# Patient Record
Sex: Female | Born: 1970 | Race: Black or African American | Hispanic: No | Marital: Single
Health system: Midwestern US, Community
[De-identification: ages and names within clinical notes are randomized; demographics above are authoritative.]

## PROBLEM LIST (undated history)

## (undated) ENCOUNTER — Emergency Department (HOSPITAL_COMMUNITY): Admission: EM | Payer: Self-pay | Source: Home / Self Care

## (undated) DIAGNOSIS — G9050 Complex regional pain syndrome I, unspecified: Secondary | ICD-10-CM

## (undated) DIAGNOSIS — K219 Gastro-esophageal reflux disease without esophagitis: Secondary | ICD-10-CM

## (undated) DIAGNOSIS — F419 Anxiety disorder, unspecified: Secondary | ICD-10-CM

## (undated) DIAGNOSIS — I219 Acute myocardial infarction, unspecified: Secondary | ICD-10-CM

## (undated) DIAGNOSIS — I10 Essential (primary) hypertension: Secondary | ICD-10-CM

## (undated) DIAGNOSIS — F410 Panic disorder [episodic paroxysmal anxiety] without agoraphobia: Secondary | ICD-10-CM

## (undated) DIAGNOSIS — M5412 Radiculopathy, cervical region: Secondary | ICD-10-CM

## (undated) HISTORY — PX: LEFT HEART CATH: CATH118248

## (undated) HISTORY — DX: Radiculopathy, cervical region: M54.12

## (undated) HISTORY — PX: TUBAL LIGATION: SHX77

## (undated) HISTORY — DX: Anxiety disorder, unspecified: F41.9

## (undated) HISTORY — PX: COLONOSCOPY: SHX174

## (undated) HISTORY — DX: Gastro-esophageal reflux disease without esophagitis: K21.9

## (undated) HISTORY — DX: Panic disorder (episodic paroxysmal anxiety): F41.0

## (undated) HISTORY — DX: Essential (primary) hypertension: I10

## (undated) HISTORY — PX: UPPER GASTROINTESTINAL ENDOSCOPY: SHX188

---

## 2016-08-14 ENCOUNTER — Inpatient Hospital Stay: Admit: 2016-08-14 | Discharge: 2016-08-14 | Attending: Student in an Organized Health Care Education/Training Program

## 2016-08-14 NOTE — ED Notes (Signed)
Attempted to call patient three times over the last 60 minutes. No response. Informed CN. Will discharge patient.

## 2016-08-14 NOTE — ED Triage Notes (Addendum)
Pt states she is new to [place] area.  Went to [facility] to have medications continued and they could help her but so much, went to scheduled appt but it was only to complete paperwork.  States she is now out of her medications and needs help.  Needs refill for Clonazepam only, other Rx she has filled.  Pt complains of headache and presents anxious.

## 2016-08-14 NOTE — ED Notes (Cosign Needed)
7:28 PM  I have evaluated the patient as the Provider in Triage. I have reviewed Her vital signs and the triage nurse assessment. I have talked with the patient and any available family and advised that I am the provider in triage and have ordered the appropriate study to initiate their work up based on the clinical presentation during my assessment.  I have advised that the patient will be accommodated in the Main ED as soon as possible.  I have also requested to contact the triage nurse or myself immediately if the patient experiences any changes in their condition during this brief waiting period.  Marjie Skiff, PA-C

## 2016-10-13 DIAGNOSIS — D259 Leiomyoma of uterus, unspecified: Secondary | ICD-10-CM

## 2016-10-13 NOTE — ED Notes (Signed)
Bedside and Verbal shift change report received from Jillian, RN (offgoing nurse). Report included the following information SBAR, ED Summary, MAR and Recent Results.

## 2016-10-13 NOTE — ED Notes (Signed)
Jessica Burch at bedside.

## 2016-10-13 NOTE — ED Notes (Signed)
Report given to Ingrid RN.

## 2016-10-13 NOTE — ED Provider Notes (Signed)
HPI Comments: 46 yo F with hx of MI, anxiety, HTN, and hyper cholesteremia here for evaluation of generalized fatigue.  States she has just fell "ill" since yesterday.  Admits to starting cycle yesterday; had has some lower abd pain; hx of fibroids in the past.    Also had some nausea and chest pressure that is only when lying flat; states resolved when position changes.    +Urinary frequency.    Denies fever, cough, CP.   Cards In [place].   Former smoker.      Patient is a 46 y.o. female presenting with general illness. The history is provided by the patient.   Generalized Body Aches   This is a new problem. The current episode started yesterday. The problem occurs constantly. Associated symptoms include abdominal pain. Pertinent negatives include no headaches. She has tried nothing for the symptoms.        Past Medical History:   Diagnosis Date   ??? Elevated cholesterol    ??? Hypertension    ??? MI (myocardial infarction) ([place])    ??? Psychiatric disorder     anxiety, panic attacks       Past Surgical History:   Procedure Laterality Date   ??? CARDIAC SURG PROCEDURE UNLIST      w/o stents   ??? HX TUBAL LIGATION           History reviewed. No pertinent family history.    Social History     Social History   ??? Marital status: N/A     Spouse name: N/A   ??? Number of children: N/A   ??? Years of education: N/A     Occupational History   ??? Not on file.     Social History Main Topics   ??? Smoking status: Former Smoker   ??? Smokeless tobacco: Never Used   ??? Alcohol use No   ??? Drug use: No   ??? Sexual activity: Not on file     Other Topics Concern   ??? Not on file     Social History Narrative         ALLERGIES: Darvocet a500 [propoxyphene n-acetaminophen]; Norvasc [amlodipine]; and Vicodin [hydrocodone-acetaminophen]    Review of Systems   Constitutional: Negative.    HENT: Negative for ear discharge.    Eyes: Negative for photophobia, pain, discharge and visual disturbance.   Respiratory: Negative for apnea and cough.     Cardiovascular: Negative for palpitations and leg swelling.   Gastrointestinal: Positive for abdominal pain. Negative for abdominal distention and blood in stool.   Genitourinary: Negative for difficulty urinating, dysuria, flank pain, frequency and hematuria.   Musculoskeletal: Negative for back pain, gait problem, joint swelling, myalgias and neck pain.   Skin: Negative for color change and pallor.   Neurological: Negative for dizziness, syncope, weakness, numbness and headaches.   Psychiatric/Behavioral: Negative for behavioral problems and confusion. The patient is nervous/anxious.        Vitals:    10/13/16 2249   BP: (!) 152/102   Pulse: 86   Resp: 18   Temp: 97.9 ??F (36.6 ??C)   SpO2: 100%   Weight: 76.5 kg (168 lb 9.6 oz)   Height: 5' (1.524 m)            Physical Exam   Constitutional: She is oriented to person, place, and time. She appears well-developed and well-nourished. No distress.   HENT:   Head: Normocephalic and atraumatic.   Right Ear: External ear normal.  Left Ear: External ear normal.   Nose: Nose normal.   Mouth/Throat: Oropharynx is clear and moist.   Eyes: Conjunctivae and EOM are normal. Pupils are equal, round, and reactive to light. Right eye exhibits no discharge. Left eye exhibits no discharge.   Neck: Normal range of motion. Neck supple.   Cardiovascular: Normal rate, regular rhythm, normal heart sounds and intact distal pulses.    Pulmonary/Chest: Effort normal and breath sounds normal.   Abdominal: Soft. Bowel sounds are normal. She exhibits no distension. There is no tenderness. There is no rebound and no guarding.   Musculoskeletal: Normal range of motion. She exhibits no edema or tenderness.   Neurological: She is alert and oriented to person, place, and time. No cranial nerve deficit. Coordination normal.   Skin: Skin is warm and dry. No rash noted.   Psychiatric: She has a normal mood and affect. Her behavior is normal. Judgment and thought content normal.    Nursing note and vitals reviewed.       MDM  Number of Diagnoses or Management Options  Abdominal pain, generalized:   Uterine leiomyoma, unspecified location:   Diagnosis management comments: 46 yo F with painful menses/abd pain/and chest pressure with lying flat; symptoms x 2 days; pressure is only with lying flat and resolved with any other position; denies SOB.  Labs and CT with no acute finding; symptoms improved.   Will have close followup; return if change or worsening of symptoms.  Dr Laneta Simmers agrees. Lavell Luster, PA         Amount and/or Complexity of Data Reviewed  Clinical lab tests: ordered and reviewed  Tests in the radiology section of CPT??: ordered and reviewed  Discuss the patient with other providers: yes  Independent visualization of images, tracings, or specimens: yes          ED Course       Procedures    Patient has been reassessed.  Feeling better.  Reviewed labs, medications and radiographics with patient.  Ready to discharge home.      Discussed case with attending Physician Laneta Simmers.  Agrees with care and will D/C with follow up.      Patient's results have been reviewed with them.  Patient and/or family have verbally conveyed their understanding and agreement of the patient's signs, symptoms, diagnosis, treatment and prognosis and additionally agree to follow up as recommended or return to the Emergency Room should their condition change prior to follow-up.  Discharge instructions have also been provided to the patient with some educational information regarding their diagnosis as well a list of reasons why they would want to return to the ER prior to their follow-up appointment should their condition change.  Lavell Luster, PA

## 2016-10-13 NOTE — ED Triage Notes (Signed)
Patient states "my body just feels sick" since yesterday.  C/o nausea and generalized body aches.  Feels chest pressure when lying flat, denies CP/SOB.  Also c/o intermittent abdominal pain for "a while".  Also c/o urinary frequency.

## 2016-10-14 ENCOUNTER — Emergency Department: Admit: 2016-10-14 | Payer: MEDICAID | Primary: Internal Medicine

## 2016-10-14 ENCOUNTER — Inpatient Hospital Stay
Admit: 2016-10-14 | Discharge: 2016-10-14 | Disposition: A | Discharge status: Home / Self Care | Payer: MEDICAID | Attending: Emergency Medicine

## 2016-10-14 LAB — EKG 12-LEAD
Atrial Rate: 73 {beats}/min
Diagnosis: NORMAL
P Axis: 73 degrees
P-R Interval: 152 ms
Q-T Interval: 390 ms
QRS Duration: 78 ms
QTc Calculation (Bazett): 429 ms
R Axis: 0 degrees
T Axis: 10 degrees
Ventricular Rate: 73 {beats}/min

## 2016-10-14 LAB — D-DIMER, QUANTITATIVE: D-Dimer, Quant: 0.36 mg/L FEU (ref 0.00–0.65)

## 2016-10-14 LAB — EKG, 12 LEAD, INITIAL
Atrial Rate: 73 {beats}/min
Calculated P Axis: 73 degrees
Calculated R Axis: 0 degrees
Calculated T Axis: 10 degrees
Diagnosis: NORMAL
P-R Interval: 152 ms
Q-T Interval: 390 ms
QRS Duration: 78 ms
QTC Calculation (Bezet): 429 ms
Ventricular Rate: 73 {beats}/min

## 2016-10-14 LAB — METABOLIC PANEL, COMPREHENSIVE
A-G Ratio: 0.8 — ABNORMAL LOW (ref 1.1–2.2)
ALT (SGPT): 31 U/L (ref 12–78)
AST (SGOT): 18 U/L (ref 15–37)
Albumin: 3.6 g/dL (ref 3.5–5.0)
Alk. phosphatase: 106 U/L (ref 45–117)
Anion gap: 7 mmol/L (ref 5–15)
BUN/Creatinine ratio: 12 (ref 12–20)
BUN: 10 MG/DL (ref 6–20)
Bilirubin, total: 0.2 MG/DL (ref 0.2–1.0)
CO2: 29 mmol/L (ref 21–32)
Calcium: 8.6 MG/DL (ref 8.5–10.1)
Chloride: 102 mmol/L (ref 97–108)
Creatinine: 0.84 MG/DL (ref 0.55–1.02)
GFR est AA: >60 mL/min/{1.73_m2} (ref >60)
GFR est non-AA: >60 mL/min/{1.73_m2} (ref >60)
Globulin: 4.5 g/dL — ABNORMAL HIGH (ref 2.0–4.0)
Glucose: 103 mg/dL — ABNORMAL HIGH (ref 65–100)
Potassium: 3.5 mmol/L (ref 3.5–5.1)
Protein, total: 8.1 g/dL (ref 6.4–8.2)
Sodium: 138 mmol/L (ref 136–145)

## 2016-10-14 LAB — URINALYSIS W/MICROSCOPIC
Bacteria: NEGATIVE /hpf
Bilirubin: NEGATIVE
Blood: NEGATIVE
Glucose: NEGATIVE mg/dL
Ketone: NEGATIVE mg/dL
Leukocyte Esterase: NEGATIVE
Nitrites: NEGATIVE
Protein: NEGATIVE mg/dL
Specific gravity: 1.028 (ref 1.003–1.030)
Urobilinogen: 1 EU/dL (ref 0.2–1.0)
pH (UA): 6.5 (ref 5.0–8.0)

## 2016-10-14 LAB — CBC WITH AUTOMATED DIFF
ABS. BASOPHILS: 0 10*3/uL (ref 0.0–0.1)
ABS. EOSINOPHILS: 0.1 10*3/uL (ref 0.0–0.4)
ABS. IMM. GRANS.: 0 10*3/uL (ref 0.00–0.04)
ABS. LYMPHOCYTES: 2.4 10*3/uL (ref 0.8–3.5)
ABS. MONOCYTES: 0.7 10*3/uL (ref 0.0–1.0)
ABS. NEUTROPHILS: 4.3 10*3/uL (ref 1.8–8.0)
ABSOLUTE NRBC: 0 10*3/uL (ref 0.00–0.01)
BASOPHILS: 0 % (ref 0–1)
EOSINOPHILS: 1 % (ref 0–7)
HCT: 33.8 % — ABNORMAL LOW (ref 35.0–47.0)
HGB: 10.8 g/dL — ABNORMAL LOW (ref 11.5–16.0)
IMMATURE GRANULOCYTES: 0 % (ref 0.0–0.5)
LYMPHOCYTES: 32 % (ref 12–49)
MCH: 25.2 PG — ABNORMAL LOW (ref 26.0–34.0)
MCHC: 32 g/dL (ref 30.0–36.5)
MCV: 78.8 FL — ABNORMAL LOW (ref 80.0–99.0)
MONOCYTES: 9 % (ref 5–13)
MPV: 10.1 FL (ref 8.9–12.9)
NEUTROPHILS: 57 % (ref 32–75)
NRBC: 0 PER 100 WBC
PLATELET: 392 10*3/uL (ref 150–400)
RBC: 4.29 M/uL (ref 3.80–5.20)
RDW: 16.1 % — ABNORMAL HIGH (ref 11.5–14.5)
WBC: 7.6 10*3/uL (ref 3.6–11.0)

## 2016-10-14 LAB — SAMPLES BEING HELD

## 2016-10-14 LAB — LIPASE: Lipase: 182 U/L (ref 73–393)

## 2016-10-14 LAB — D DIMER: D-dimer: 0.36 mg/L FEU (ref 0.00–0.65)

## 2016-10-14 LAB — URINE CULTURE HOLD SAMPLE

## 2016-10-14 LAB — TROPONIN I: Troponin-I, Qt.: <0.05 ng/mL (ref <0.05)

## 2016-10-14 MED ORDER — IOPAMIDOL 76 % IV SOLN
370 mg iodine /mL (76 %) | Freq: Once | INTRAVENOUS | Status: AC
Start: 2016-10-14 — End: 2016-10-14
  Administered 2016-10-14: 05:00:00 via INTRAVENOUS

## 2016-10-14 MED ORDER — DIPHENHYDRAMINE HCL 50 MG/ML IJ SOLN
50 mg/mL | INTRAMUSCULAR | Status: DC
Start: 2016-10-14 — End: 2016-10-14

## 2016-10-14 MED ORDER — SODIUM CHLORIDE 0.9% BOLUS IV
0.9 % | Freq: Once | INTRAVENOUS | Status: AC
Start: 2016-10-14 — End: 2016-10-14
  Administered 2016-10-14: 03:00:00 via INTRAVENOUS

## 2016-10-14 MED ORDER — KETOROLAC TROMETHAMINE 30 MG/ML INJECTION
30 mg/mL (1 mL) | INTRAMUSCULAR | Status: AC
Start: 2016-10-14 — End: 2016-10-13
  Administered 2016-10-14: 03:00:00 via INTRAVENOUS

## 2016-10-14 MED ORDER — ONDANSETRON (PF) 4 MG/2 ML INJECTION
4 mg/2 mL | INTRAMUSCULAR | Status: AC
Start: 2016-10-14 — End: 2016-10-13
  Administered 2016-10-14: 03:00:00 via INTRAVENOUS

## 2016-10-14 MED ORDER — NAPROXEN 500 MG TAB
500 mg | ORAL_TABLET | Freq: Two times a day (BID) | ORAL | 0 refills | Status: DC | PRN
Start: 2016-10-14 — End: 2018-02-05

## 2016-10-14 MED ORDER — SODIUM CHLORIDE 0.9% BOLUS IV
0.9 % | Freq: Once | INTRAVENOUS | Status: AC
Start: 2016-10-14 — End: 2016-10-14
  Administered 2016-10-14: 05:00:00 via INTRAVENOUS

## 2016-10-14 MED ORDER — SODIUM CHLORIDE 0.9 % IJ SYRG
Freq: Once | INTRAMUSCULAR | Status: AC
Start: 2016-10-14 — End: 2016-10-14
  Administered 2016-10-14: 05:00:00 via INTRAVENOUS

## 2016-10-14 MED ORDER — DIPHENHYDRAMINE HCL 50 MG/ML IJ SOLN
50 mg/mL | INTRAMUSCULAR | Status: AC
Start: 2016-10-14 — End: 2016-10-14
  Administered 2016-10-14: 06:00:00 via INTRAVENOUS

## 2016-10-14 MED FILL — DIPHENHYDRAMINE HCL 50 MG/ML IJ SOLN: 50 mg/mL | INTRAMUSCULAR | Qty: 1

## 2016-10-14 MED FILL — SODIUM CHLORIDE 0.9 % IV: INTRAVENOUS | Qty: 1000

## 2016-10-14 MED FILL — KETOROLAC TROMETHAMINE 30 MG/ML INJECTION: 30 mg/mL (1 mL) | INTRAMUSCULAR | Qty: 1

## 2016-10-14 MED FILL — ONDANSETRON (PF) 4 MG/2 ML INJECTION: 4 mg/2 mL | INTRAMUSCULAR | Qty: 2

## 2016-10-14 NOTE — ED Notes (Signed)
Pt ambulatory to restroom with steady gait.

## 2016-10-14 NOTE — ED Notes (Signed)
Pt back from CT and has a rash on left upper extremity, upper back and chest that appeared after administration of CT dye. Alyse, PA notified by CT. Pt denies sob or difficulty swallowing at this time.

## 2016-10-14 NOTE — ED Notes (Signed)
Discharge instructions given to pt by RN. Pt educated on prescribed medications in teach back method and verbalizes understanding. Opportunity for questions provided. Pt ambulatory out of unit, in no acute distress and taken home by self.

## 2016-10-14 NOTE — Progress Notes (Signed)
Left message per request of Dr. Marlon Pel.  If still having symptoms from visit to [facility] ED in September, please call to schedule and echo and new patient visit with Dr. Marlon Pel for the same day.  Echo for chest pressure, abdominal pain.

## 2016-10-14 NOTE — Progress Notes (Signed)
Left second message for patient.  She may call to schedule a new patient visit with Dr. Marlon Pel along with an echo for chest pain.  This is follow up to visit in ED at [facility] in September.

## 2016-10-14 NOTE — ED Notes (Signed)
Rash on LUE, back and chest is gone.

## 2017-04-09 ENCOUNTER — Inpatient Hospital Stay
Admit: 2017-04-09 | Discharge: 2017-04-09 | Disposition: A | Discharge status: Home / Self Care | Payer: BLUE CROSS/BLUE SHIELD | Attending: Emergency Medicine

## 2017-04-09 ENCOUNTER — Emergency Department: Admit: 2017-04-09 | Payer: BLUE CROSS/BLUE SHIELD | Primary: Internal Medicine

## 2017-04-09 DIAGNOSIS — R079 Chest pain, unspecified: Secondary | ICD-10-CM

## 2017-04-09 LAB — CBC WITH AUTOMATED DIFF
ABS. BASOPHILS: 0 10*3/uL (ref 0.0–0.1)
ABS. EOSINOPHILS: 0.1 10*3/uL (ref 0.0–0.4)
ABS. IMM. GRANS.: 0 10*3/uL (ref 0.00–0.04)
ABS. LYMPHOCYTES: 2.1 10*3/uL (ref 0.8–3.5)
ABS. MONOCYTES: 0.7 10*3/uL (ref 0.0–1.0)
ABS. NEUTROPHILS: 3.2 10*3/uL (ref 1.8–8.0)
ABSOLUTE NRBC: 0 10*3/uL (ref 0.00–0.01)
BASOPHILS: 0 % (ref 0–1)
EOSINOPHILS: 1 % (ref 0–7)
HCT: 33.6 % — ABNORMAL LOW (ref 35.0–47.0)
HGB: 11 g/dL — ABNORMAL LOW (ref 11.5–16.0)
IMMATURE GRANULOCYTES: 1 % — ABNORMAL HIGH (ref 0.0–0.5)
LYMPHOCYTES: 35 % (ref 12–49)
MCH: 24.9 PG — ABNORMAL LOW (ref 26.0–34.0)
MCHC: 32.7 g/dL (ref 30.0–36.5)
MCV: 76.2 FL — ABNORMAL LOW (ref 80.0–99.0)
MONOCYTES: 11 % (ref 5–13)
MPV: 10.5 FL (ref 8.9–12.9)
NEUTROPHILS: 52 % (ref 32–75)
NRBC: 0 PER 100 WBC
PLATELET: 497 10*3/uL — ABNORMAL HIGH (ref 150–400)
RBC: 4.41 M/uL (ref 3.80–5.20)
RDW: 17.5 % — ABNORMAL HIGH (ref 11.5–14.5)
WBC: 6.1 10*3/uL (ref 3.6–11.0)

## 2017-04-09 LAB — METABOLIC PANEL, COMPREHENSIVE
A-G Ratio: 0.9 — ABNORMAL LOW (ref 1.1–2.2)
ALT (SGPT): 18 U/L (ref 12–78)
AST (SGOT): 15 U/L (ref 15–37)
Albumin: 3.7 g/dL (ref 3.5–5.0)
Alk. phosphatase: 81 U/L (ref 45–117)
Anion gap: 9 mmol/L (ref 5–15)
BUN/Creatinine ratio: 13 (ref 12–20)
BUN: 8 MG/DL (ref 6–20)
Bilirubin, total: 0.2 MG/DL (ref 0.2–1.0)
CO2: 28 mmol/L (ref 21–32)
Calcium: 9 MG/DL (ref 8.5–10.1)
Chloride: 101 mmol/L (ref 97–108)
Creatinine: 0.64 MG/DL (ref 0.55–1.02)
GFR est AA: >60 mL/min/{1.73_m2} (ref >60)
GFR est non-AA: >60 mL/min/{1.73_m2} (ref >60)
Globulin: 4.3 g/dL — ABNORMAL HIGH (ref 2.0–4.0)
Glucose: 79 mg/dL (ref 65–100)
Potassium: 2.9 mmol/L — ABNORMAL LOW (ref 3.5–5.1)
Protein, total: 8 g/dL (ref 6.4–8.2)
Sodium: 138 mmol/L (ref 136–145)

## 2017-04-09 LAB — NT-PRO BNP: NT pro-BNP: 79 PG/ML (ref <125)

## 2017-04-09 LAB — TROPONIN I: Troponin-I, Qt.: <0.05 ng/mL (ref <0.05)

## 2017-04-09 LAB — POC TROPONIN-I: Troponin-I (POC): <0.04 ng/mL (ref 0.00–0.08)

## 2017-04-09 LAB — CK W/ REFLX CKMB: CK: 85 U/L (ref 26–192)

## 2017-04-09 LAB — LIPASE: Lipase: 105 U/L (ref 73–393)

## 2017-04-09 MED ORDER — ASPIRIN 325 MG TAB
325 mg | ORAL | Status: AC
Start: 2017-04-09 — End: 2017-04-09
  Administered 2017-04-09: 19:00:00 via ORAL

## 2017-04-09 MED ORDER — NITROGLYCERIN 0.4 MG SUBLINGUAL TAB
0.4 mg | SUBLINGUAL | Status: DC | PRN
Start: 2017-04-09 — End: 2017-04-09

## 2017-04-09 MED FILL — ASPIRIN 325 MG TAB: 325 mg | ORAL | Qty: 1

## 2017-04-09 NOTE — ED Notes (Signed)
Lab called and reports specimen hemolyzed.  Will re-order and draw.

## 2017-04-09 NOTE — ED Provider Notes (Signed)
EMERGENCY DEPARTMENT HISTORY AND PHYSICAL EXAM      Date: 04/09/2017  Patient Name: Jessica Burch    History of Presenting Illness     Chief Complaint   Patient presents with   ??? Chest Pain     reports for awhile she has been having substernal chest pain radiating into her left arm. states the pain would come and go.        History Provided By: Patient    HPI: Jessica Burch, 47 y.o. female with PMHx significant for hypertension, hyperlipidemia, and diabetes, presents to the ED with cc of mild substernal chest pressure over the last week.  Patient reports symptoms radiate to her left arm but are not associated aided with oral intake or exertion.  She denies any nausea vomiting, diaphoresis, or any shortness of breath.  She has never had a prior cardiac evaluation.  She has no PE risk factors.  She reports that her symptoms have been continuous throughout the day today.          There are no other complaints, changes, or physical findings at this time.    PCP: Nicholes Rough, DO    No current facility-administered medications on file prior to encounter.      Current Outpatient Medications on File Prior to Encounter   Medication Sig Dispense Refill   ??? losartan (COZAAR) 50 mg tablet Take 50 mg by mouth daily.     ??? hydroCHLOROthiazide (HYDRODIURIL) 25 mg tablet Take 25 mg by mouth daily.     ??? clonazePAM (KLONOPIN) 0.5 mg tablet Take 0.5 mg by mouth two (2) times daily as needed (Anxiety).     ??? labetalol (NORMODYNE) 200 mg tablet Take 200 mg by mouth two (2) times a day.     ??? hydrALAZINE (APRESOLINE) 25 mg tablet Take 25 mg by mouth three (3) times daily.     ??? mirtazapine (REMERON) 15 mg tablet Take 15 mg by mouth nightly as needed.     ??? naproxen (NAPROSYN) 500 mg tablet Take 1 Tab by mouth every twelve (12) hours as needed for Pain. 20 Tab 0       Past History     Past Medical History:  Past Medical History:   Diagnosis Date   ??? Elevated cholesterol    ??? Hypertension    ??? MI (myocardial infarction) ([place])     ??? Psychiatric disorder     anxiety, panic attacks       Past Surgical History:  Past Surgical History:   Procedure Laterality Date   ??? CARDIAC SURG PROCEDURE UNLIST      w/o stents   ??? HX TUBAL LIGATION         Family History:  History reviewed. No pertinent family history.    Social History:  Social History     Tobacco Use   ??? Smoking status: Former Smoker   ??? Smokeless tobacco: Never Used   Substance Use Topics   ??? Alcohol use: Yes     Comment: Seldom   ??? Drug use: No       Allergies:  Allergies   Allergen Reactions   ??? Contrast Agent [Iodine] Hives   ??? Darvocet A500 [Propoxyphene N-Acetaminophen] Hives   ??? Norvasc [Amlodipine] Hives   ??? Vicodin [Hydrocodone-Acetaminophen] Hives         Review of Systems   Review of Systems   Constitutional: Negative for chills, diaphoresis, fatigue and fever.   HENT: Negative for ear pain and  sore throat.    Eyes: Negative for pain and redness.   Respiratory: Negative for cough and shortness of breath.    Gastrointestinal: Negative for abdominal pain, diarrhea, nausea and vomiting.   Endocrine: Negative for cold intolerance and heat intolerance.   Genitourinary: Negative for flank pain and hematuria.   Musculoskeletal: Negative for back pain and neck stiffness.   Skin: Negative for rash and wound.   Neurological: Negative for dizziness, syncope and headaches.   All other systems reviewed and are negative.      Physical Exam   Physical Exam   Constitutional: She is oriented to person, place, and time. She appears well-developed and well-nourished.   HENT:   Head: Normocephalic and atraumatic.   Mouth/Throat: Oropharynx is clear and moist. No oropharyngeal exudate.   Eyes: Pupils are equal, round, and reactive to light. Conjunctivae and EOM are normal.   Neck: Normal range of motion.   Cardiovascular: Normal rate and regular rhythm.   No murmur heard.  Pulmonary/Chest: Effort normal and breath sounds normal. No respiratory distress. She has no wheezes.    Abdominal: Soft. Bowel sounds are normal. She exhibits no distension. There is no tenderness.   Musculoskeletal: Normal range of motion. She exhibits no edema or deformity.   Neurological: She is alert and oriented to person, place, and time. Coordination normal.   Skin: Skin is warm and dry. No rash noted.   Psychiatric: She has a normal mood and affect. Her behavior is normal.   Nursing note and vitals reviewed.      Diagnostic Study Results     Labs -     Recent Results (from the past 12 hour(s))   EKG, 12 LEAD, INITIAL    Collection Time: 04/09/17  2:42 PM   Result Value Ref Range    Ventricular Rate 73 BPM    Atrial Rate 73 BPM    P-R Interval 154 ms    QRS Duration 86 ms    Q-T Interval 394 ms    QTC Calculation (Bezet) 434 ms    Calculated P Axis 75 degrees    Calculated R Axis 0 degrees    Calculated T Axis 24 degrees    Diagnosis       Normal sinus rhythm  T wave abnormality, consider anterior ischemia  When compared with ECG of 13-Oct-2016 23:12,  T wave inversion now evident in Anterior leads     CBC WITH AUTOMATED DIFF    Collection Time: 04/09/17  4:03 PM   Result Value Ref Range    WBC 6.1 3.6 - 11.0 K/uL    RBC 4.41 3.80 - 5.20 M/uL    HGB 11.0 (L) 11.5 - 16.0 g/dL    HCT 33.6 (L) 35.0 - 47.0 %    MCV 76.2 (L) 80.0 - 99.0 FL    MCH 24.9 (L) 26.0 - 34.0 PG    MCHC 32.7 30.0 - 36.5 g/dL    RDW 17.5 (H) 11.5 - 14.5 %    PLATELET 497 (H) 150 - 400 K/uL    MPV 10.5 8.9 - 12.9 FL    NRBC 0.0 0 PER 100 WBC    ABSOLUTE NRBC 0.00 0.00 - 0.01 K/uL    NEUTROPHILS 52 32 - 75 %    LYMPHOCYTES 35 12 - 49 %    MONOCYTES 11 5 - 13 %    EOSINOPHILS 1 0 - 7 %    BASOPHILS 0 0 - 1 %    IMMATURE  GRANULOCYTES 1 (H) 0.0 - 0.5 %    ABS. NEUTROPHILS 3.2 1.8 - 8.0 K/UL    ABS. LYMPHOCYTES 2.1 0.8 - 3.5 K/UL    ABS. MONOCYTES 0.7 0.0 - 1.0 K/UL    ABS. EOSINOPHILS 0.1 0.0 - 0.4 K/UL    ABS. BASOPHILS 0.0 0.0 - 0.1 K/UL    ABS. IMM. GRANS. 0.0 0.00 - 0.04 K/UL    DF AUTOMATED     LIPASE    Collection Time: 04/09/17  4:42 PM    Result Value Ref Range    Lipase 105 73 - 393 U/L   NT-PRO BNP    Collection Time: 04/09/17  4:42 PM   Result Value Ref Range    NT pro-BNP 79 <125 PG/ML   TROPONIN I    Collection Time: 04/09/17  4:42 PM   Result Value Ref Range    Troponin-I, Qt. <0.05 <3.24 ng/mL   METABOLIC PANEL, COMPREHENSIVE    Collection Time: 04/09/17  4:42 PM   Result Value Ref Range    Sodium 138 136 - 145 mmol/L    Potassium 2.9 (L) 3.5 - 5.1 mmol/L    Chloride 101 97 - 108 mmol/L    CO2 28 21 - 32 mmol/L    Anion gap 9 5 - 15 mmol/L    Glucose 79 65 - 100 mg/dL    BUN 8 6 - 20 MG/DL    Creatinine 0.64 0.55 - 1.02 MG/DL    BUN/Creatinine ratio 13 12 - 20      GFR est AA >60 >60 ml/min/1.47m    GFR est non-AA >60 >60 ml/min/1.774m   Calcium 9.0 8.5 - 10.1 MG/DL    Bilirubin, total 0.2 0.2 - 1.0 MG/DL    ALT (SGPT) 18 12 - 78 U/L    AST (SGOT) 15 15 - 37 U/L    Alk. phosphatase 81 45 - 117 U/L    Protein, total 8.0 6.4 - 8.2 g/dL    Albumin 3.7 3.5 - 5.0 g/dL    Globulin 4.3 (H) 2.0 - 4.0 g/dL    A-G Ratio 0.9 (L) 1.1 - 2.2     CK W/ REFLX CKMB    Collection Time: 04/09/17  4:42 PM   Result Value Ref Range    CK 85 26 - 192 U/L       Radiologic Studies -   XR CHEST PA LAT   Final Result   Impression:   1. No acute cardiopulmonary disease           CT Results  (Last 48 hours)    None        CXR Results  (Last 48 hours)               04/09/17 1549  XR CHEST PA LAT Final result    Impression:  Impression:   1. No acute cardiopulmonary disease           Narrative:  INDICATION:  chest pain, sob        Exam: Chest 2 views.        Comparison: 10/14/2016.        Findings: Cardiomediastinal silhouette is normal. Pulmonary vasculature is not   engorged. No focal parenchymal opacities, effusions, or pneumothorax. Bony   thorax is intact.                   Medical Decision Making   I am the first provider for this patient.  I reviewed the vital signs, available nursing notes, past medical history,  past surgical history, family history and social history.    Vital Signs-Reviewed the patient's vital signs.  Patient Vitals for the past 12 hrs:   Temp Pulse Resp BP SpO2   04/09/17 1600 ??? 75 (!) 7 162/85 ???   04/09/17 1447 99 ??F (37.2 ??C) 86 16 (!) 191/95 95 %       Pulse Oximetry Analysis -98 % on room air    Cardiac Monitor:   Rate: 90 bpm  Rhythm: Normal Sinus Rhythm        Records Reviewed: Nursing Notes and Old Medical Records    Differential Diagnosis:    Patient presents with CP.  DDx:  ACS, Aortic dissection, PNA, PE, PTX, pericarditis, myocarditis, GERD, costochondritis, anxiety.  Concerned for ACS versus muscular skeletal etiology given the HPI and Physical exam. Will obtain labs, CXR, EKG and get Cardiology Consult PRN.    - I have re-examined the patient and the patient denies chest pain on re-examination.  The patient has had onset of chest pain greater than 6 hours and one negative set of cardiac enzymes or 2 negative sets of cardiac enzymes in the ER during this visit.  The diagnosis, follow up, return instructions, test results, x-rays and medications have been discussed and reviewed with the patient.  The patient has been given the opportunity to ask questions. The patient  expresses understanding of the diagnosis, follow-up and return instructions.  The patient agrees to follow up with primary care or cardiology as directed and to return immediately if the chest pain worsens.  The patient expresses understanding that although cardiac testing at this time is negative, a cardiac problem could still be present and that a follow-up appointment for further evaluation and risk factor modification is necessary to complete the evaluation of this complaint.          Provider Notes (Medical Decision Making):     EKG (854): Normal sinus rhythm.  T wave inversion in V1 V2.  Normal PR QRS and QT intervals.  No ST segment changes.  No STEMI     EKG: Normal sinus rhythm rate of 71.  No T wave inversion.  No ST segment changes.  Normal PR, QRS, QT intervals.  No STEMI    Patient with serial EKGs and serial troponins which are non-concerning for ACS.  Patient has a heart score of 2 and is good outpatient candidate for follow-up with cardiology for outpatient stress testing.  Referral was provided.      ED Course:     Initial assessment performed. The patients presenting problems have been discussed, and they are in agreement with the care plan formulated and outlined with them.  I have encouraged them to ask questions as they arise throughout their visit.         Critical Care Time:     1    Disposition:  Discharged home    1:45 PM  Tyshana Hornbaker's  results have been reviewed with her.  She has been counseled regarding her diagnosis.  She verbally conveys understanding and agreement of the signs, symptoms, diagnosis, treatment and prognosis and additionally agrees to follow up as recommended with Dr. Raeford Razor, Mardene Celeste L, DO in 24 - 48 hours.  She also agrees with the care-plan and conveys that all of her questions have been answered.  I have also put together some discharge instructions for her that include: 1) educational information regarding their diagnosis,  2) how to care for their diagnosis at home, as well a 3) list of reasons why they would want to return to the ED prior to their follow-up appointment, should their condition change.              PLAN:  1.   Current Discharge Medication List        2.   Follow-up Information    None       Return to ED if worse     Diagnosis     Clinical Impression: No diagnosis found.

## 2017-04-10 LAB — EKG 12-LEAD
Atrial Rate: 71 {beats}/min
Atrial Rate: 73 {beats}/min
Diagnosis: NORMAL
Diagnosis: NORMAL
P Axis: 71 degrees
P Axis: 75 degrees
P-R Interval: 150 ms
P-R Interval: 154 ms
Q-T Interval: 394 ms
Q-T Interval: 428 ms
QRS Duration: 82 ms
QRS Duration: 86 ms
QTc Calculation (Bazett): 434 ms
QTc Calculation (Bazett): 465 ms
R Axis: -2 degrees
R Axis: 0 degrees
T Axis: 24 degrees
T Axis: 9 degrees
Ventricular Rate: 71 {beats}/min
Ventricular Rate: 73 {beats}/min

## 2017-04-10 LAB — EKG, 12 LEAD, SUBSEQUENT
Atrial Rate: 71 {beats}/min
Calculated P Axis: 71 degrees
Calculated R Axis: -2 degrees
Calculated T Axis: 9 degrees
Diagnosis: NORMAL
P-R Interval: 150 ms
Q-T Interval: 428 ms
QRS Duration: 82 ms
QTC Calculation (Bezet): 465 ms
Ventricular Rate: 71 {beats}/min

## 2017-04-10 LAB — EKG, 12 LEAD, INITIAL
Atrial Rate: 73 {beats}/min
Calculated P Axis: 75 degrees
Calculated R Axis: 0 degrees
Calculated T Axis: 24 degrees
Diagnosis: NORMAL
P-R Interval: 154 ms
Q-T Interval: 394 ms
QRS Duration: 86 ms
QTC Calculation (Bezet): 434 ms
Ventricular Rate: 73 {beats}/min

## 2017-05-14 ENCOUNTER — Encounter: Attending: Obstetrics & Gynecology | Primary: Internal Medicine

## 2017-08-12 ENCOUNTER — Emergency Department: Admit: 2017-08-12 | Payer: Worker's Compensation | Primary: Internal Medicine

## 2017-08-12 ENCOUNTER — Inpatient Hospital Stay
Admit: 2017-08-12 | Discharge: 2017-08-12 | Disposition: A | Discharge status: Home / Self Care | Payer: Worker's Compensation | Attending: Emergency Medicine

## 2017-08-12 DIAGNOSIS — S92254A Nondisplaced fracture of navicular [scaphoid] of right foot, initial encounter for closed fracture: Secondary | ICD-10-CM

## 2017-08-12 MED ORDER — ACETAMINOPHEN 500 MG TAB
500 mg | ORAL_TABLET | Freq: Four times a day (QID) | ORAL | 0 refills | Status: AC | PRN
Start: 2017-08-12 — End: ?

## 2017-08-12 MED ORDER — IBUPROFEN 600 MG TAB
600 mg | ORAL_TABLET | Freq: Four times a day (QID) | ORAL | 0 refills | Status: AC | PRN
Start: 2017-08-12 — End: ?

## 2017-08-12 NOTE — ED Notes (Signed)
Thumb gutter splint applied by Sharyl Nimrod PCT, clean and intact, full sensation to finger, skin warm and pink.

## 2017-08-12 NOTE — ED Notes (Signed)
Patient comes to the ER c/o slipping down stairs and injuring R ring finger yesterday. No visible deformity.

## 2017-08-12 NOTE — ED Notes (Signed)
Pt given discharge instructions, patient education, prescriptions, and follow up information. Pt states understanding. All questions answered. Pt discharged to home in private vehicle, ambulatory. Pt A/Ox4, RA, pain controlled.

## 2017-08-12 NOTE — ED Provider Notes (Signed)
ED Provider Notes by Daron Offer, NP at 08/12/17 1615                Author: Daron Offer, NP  Service: EMERGENCY  Author Type: Nurse Practitioner       Filed: 08/12/17 1810  Date of Service: 08/12/17 1615  Status: Attested Addendum          Editor: Daron Offer, NP (Nurse Practitioner)       Related Notes: Original Note by Daron Offer, NP (Nurse Practitioner) filed at 08/12/17 1744          Cosigner: Ashley Akin, MD at 08/12/17 2033          Attestation signed by Ashley Akin, MD at 08/12/17 2033          I was personally available for consultation in the emergency department.  I have reviewed the chart and agree with the documentation recorded by the [facility], including  the assessment, treatment plan, and disposition.   Ashley Akin, MD                                    Initial Complaint: finger pain      Started: yesterday      Endorses: right ring finger pian after falling down steps yesterday. Grabbed the banister and twisted the finger. Pt is right handed   Denies: laceration      Made better: rest   Made worse: movement      No further complaints.       Past Medical History:   No date: Elevated cholesterol   No date: Hypertension   No date: MI (myocardial infarction) ([place])   No date: Psychiatric disorder       Comment:  anxiety, panic attacks   Past Surgical History:   No date: CARDIAC SURG PROCEDURE UNLIST       Comment:  w/o stents   No date: HX TUBAL LIGATION   Reviewed         Primary care provider: Nicholes Rough, DO         The history is provided by the patient. No language interpreter  was used.          Past Medical History:        Diagnosis  Date         ?  Elevated cholesterol       ?  Hypertension       ?  MI (myocardial infarction) ([place])       ?  Psychiatric disorder            anxiety, panic attacks          Past Surgical History:         Procedure  Laterality  Date          ?  CARDIAC SURG PROCEDURE UNLIST              w/o stents          ?  HX  TUBAL LIGATION            No family history on file.        Social History          Socioeconomic History         ?  Marital status:  SINGLE  Spouse name:  Not on file         ?  Number of children:  Not on file     ?  Years of education:  Not on file     ?  Highest education level:  Not on file       Occupational History        ?  Not on file       Social Needs         ?  Financial resource strain:  Not on file        ?  Food insecurity:              Worry:  Not on file         Inability:  Not on file        ?  Transportation needs:              Medical:  Not on file         Non-medical:  Not on file       Tobacco Use         ?  Smoking status:  Former Smoker     ?  Smokeless tobacco:  Never Used       Substance and Sexual Activity         ?  Alcohol use:  Yes             Comment: Seldom         ?  Drug use:  No     ?  Sexual activity:  Not on file       Lifestyle        ?  Physical activity:              Days per week:  Not on file         Minutes per session:  Not on file         ?  Stress:  Not on file       Relationships        ?  Social connections:              Talks on phone:  Not on file         Gets together:  Not on file         Attends religious service:  Not on file         Active member of club or organization:  Not on file         Attends meetings of clubs or organizations:  Not on file         Relationship status:  Not on file        ?  Intimate partner violence:              Fear of current or ex partner:  Not on file         Emotionally abused:  Not on file         Physically abused:  Not on file         Forced sexual activity:  Not on file        Other Topics  Concern        ?  Not on file       Social History Narrative        ?  Not on file        ALLERGIES: Contrast agent [iodine]; Darvocet a500 [propoxyphene n-acetaminophen]; Norvasc [amlodipine];  and Vicodin [hydrocodone-acetaminophen]      Review of Systems    Musculoskeletal: Positive for arthralgias and myalgias .    All other  systems reviewed and are negative.        Vitals:           08/12/17 1526  08/12/17 1618         BP:    154/81     Pulse:  86  71     Resp:    18     Temp:    98.2 ??F (36.8 ??C)         SpO2:  99%  100%             Physical Exam    Constitutional: She appears well-developed and well-nourished.    HENT:    Head: Atraumatic.    Eyes: EOM are normal.    Neck: No tracheal deviation present.    Pulmonary/Chest: Effort normal. No respiratory distress.   Musculoskeletal: Normal range of motion.        Right wrist: She exhibits  tenderness and bony tenderness. She exhibits normal range of motion, no swelling,  no effusion, no crepitus, no deformity and no laceration.   Mild swelling across the dorsum of the hand.  Tenderness along the right fourth finger along the bone.  Sensation intact.  Able to  evert and invert the hand without pain.  Mild right snuffbox tenderness at the wrist.   Neurological:  She is alert.    Skin: Skin is warm and dry.   Psychiatric: She has a normal mood and affect. Her behavior is normal. Judgment and thought content  normal.    Nursing note and vitals reviewed.       MDM          Procedures      Assessment & Plan:         Orders Placed This Encounter        ?  XR WRIST RT AP/LAT/OBL MIN 3V        ?  XR 4TH FINGER RT MIN 2 V           Discussed with Ashley Akin, MD,ED Provider      Daron Offer, NP   08/12/17   4:20 PM         Right scaphoid fracture.  Will place in thumb spica splint.  Tylenol/ibuprofen for pain control.  Follow-up with [facility] or [facility].  Gave referral for Dr. Herschel Senegal or Dr. Pam Drown.  Discussed return precautions.      5:44 PM   Patient re-evaluated.  All questions answered.  Patient appropriate for discharge.  Given return precautions and follow up instructions.       LABORATORY TESTS:   Labs Reviewed - No data to display      IMAGING RESULTS:   Xr Wrist Rt Ap/lat/obl Min 3v      Result Date: 08/12/2017   EXAM: XR WRIST RT AP/LAT/OBL MIN 3V INDICATION: Fell down  stairs. Snuff box tenderness. COMPARISON: None. FINDINGS: Three  views of the right wrist demonstrate subtle lucency at the junction of the waist and distal pole consistent with nondisplaced fracture.  No dislocation or other fracture is shown. The soft tissues are within normal limits.       IMPRESSION: Scaphoid fracture at junction of waist and distal pole.      Xr 4th Finger Rt Min 2 V      Result Date: 08/12/2017  EXAM: XR 4TH FINGER RT MIN 2 V INDICATION: Status post fall with right 4th finger pain. COMPARISON: None. FINDINGS: Three views of the right fourth finger demonstrate no fracture or other acute osseous or articular abnormality. The soft tissues are within  normal limits. As on wrist radiographs, note is made presence of scaphoid fracture.        IMPRESSION: No acute finger abnormality. Scaphoid fracture shown.         MEDICATIONS GIVEN:   Medications - No data to display      IMPRESSION:      1.  Closed nondisplaced fracture of scaphoid of right wrist, unspecified portion of scaphoid, initial encounter            PLAN:   1.      Current Discharge Medication List              START taking these medications          Details        ibuprofen (MOTRIN) 600 mg tablet  Take 1 Tab by mouth every six (6) hours as needed for Pain.   Qty: 30 Tab, Refills:  0               acetaminophen (TYLENOL) 500 mg tablet  Take 2 Tabs by mouth every six (6) hours as needed for Pain. Indications: Pain   Qty: 30 Tab, Refills:  0                      2.      Follow-up Information               Follow up With  Specialties  Details  Why  Contact Info              Vernard Gambles, MD  Orthopedic Surgery  Schedule an appointment as soon as possible for a visit    [address]   [place] [address]   [phone]                 Terald Sleeper, MD  Orthopedic Surgery  Schedule an appointment as soon as possible for a visit    [place] [address]   [place], [place], DO   Internal Medicine  Schedule an appointment as soon as possible for a visit  As needed  [address] [place]   [place] [address]   [phone]                 [facility]  Emergency Medicine    As needed, If symptoms worsen  [place]   [phone]             3.       Return to ED for new or worsening symptoms          Daron Offer, NP                        Please note that this dictation was completed with Dragon, the computer voice recognition software.  Quite often unanticipated grammatical, syntax, homophones, and other interpretive  errors are inadvertently transcribed by the computer software.  Please disregard these errors.  Please excuse any errors that  have escaped final proofreading.

## 2017-08-12 NOTE — ED Triage Notes (Signed)
Patient comes to the ER c/o slipping down stairs and injuring R ring finger yesterday. No visible deformity.

## 2017-08-12 NOTE — ED Provider Notes (Addendum)
Initial Complaint: finger pain    Started: yesterday    Endorses: right ring finger pian after falling down steps yesterday. Grabbed the banister and twisted the finger. Pt is right handed  Denies: laceration    Made better: rest  Made worse: movement    No further complaints.     Past Medical History:  No date: Elevated cholesterol  No date: Hypertension  No date: MI (myocardial infarction) ([place])  No date: Psychiatric disorder      Comment:  anxiety, panic attacks  Past Surgical History:  No date: CARDIAC SURG PROCEDURE UNLIST      Comment:  w/o stents  No date: HX TUBAL LIGATION  Reviewed      Primary care provider: Nicholes Rough, DO      The history is provided by the patient. No language interpreter was used.      Past Medical History:   Diagnosis Date   ??? Elevated cholesterol    ??? Hypertension    ??? MI (myocardial infarction) ([place])    ??? Psychiatric disorder     anxiety, panic attacks     Past Surgical History:   Procedure Laterality Date   ??? CARDIAC SURG PROCEDURE UNLIST      w/o stents   ??? HX TUBAL LIGATION       No family history on file.    Social History     Socioeconomic History   ??? Marital status: SINGLE     Spouse name: Not on file   ??? Number of children: Not on file   ??? Years of education: Not on file   ??? Highest education level: Not on file   Occupational History   ??? Not on file   Social Needs   ??? Financial resource strain: Not on file   ??? Food insecurity:     Worry: Not on file     Inability: Not on file   ??? Transportation needs:     Medical: Not on file     Non-medical: Not on file   Tobacco Use   ??? Smoking status: Former Smoker   ??? Smokeless tobacco: Never Used   Substance and Sexual Activity   ??? Alcohol use: Yes     Comment: Seldom   ??? Drug use: No   ??? Sexual activity: Not on file   Lifestyle   ??? Physical activity:     Days per week: Not on file     Minutes per session: Not on file   ??? Stress: Not on file   Relationships   ??? Social connections:     Talks on phone: Not on file      Gets together: Not on file     Attends religious service: Not on file     Active member of club or organization: Not on file     Attends meetings of clubs or organizations: Not on file     Relationship status: Not on file   ??? Intimate partner violence:     Fear of current or ex partner: Not on file     Emotionally abused: Not on file     Physically abused: Not on file     Forced sexual activity: Not on file   Other Topics Concern   ??? Not on file   Social History Narrative   ??? Not on file     ALLERGIES: Contrast agent [iodine]; Darvocet a500 [propoxyphene n-acetaminophen]; Norvasc [amlodipine]; and Vicodin [hydrocodone-acetaminophen]    Review of  Systems   Musculoskeletal: Positive for arthralgias and myalgias.   All other systems reviewed and are negative.    Vitals:    08/12/17 1526 08/12/17 1618   BP:  154/81   Pulse: 86 71   Resp:  18   Temp:  98.2 ??F (36.8 ??C)   SpO2: 99% 100%          Physical Exam   Constitutional: She appears well-developed and well-nourished.   HENT:   Head: Atraumatic.   Eyes: EOM are normal.   Neck: No tracheal deviation present.   Pulmonary/Chest: Effort normal. No respiratory distress.   Musculoskeletal: Normal range of motion.        Right wrist: She exhibits tenderness and bony tenderness. She exhibits normal range of motion, no swelling, no effusion, no crepitus, no deformity and no laceration.   Mild swelling across the dorsum of the hand.  Tenderness along the right fourth finger along the bone.  Sensation intact.  Able to evert and invert the hand without pain.  Mild right snuffbox tenderness at the wrist.   Neurological: She is alert.   Skin: Skin is warm and dry.   Psychiatric: She has a normal mood and affect. Her behavior is normal. Judgment and thought content normal.   Nursing note and vitals reviewed.     MDM       Procedures    Assessment & Plan:     Orders Placed This Encounter   ??? XR WRIST RT AP/LAT/OBL MIN 3V   ??? XR 4TH FINGER RT MIN 2 V        Discussed with Ashley Akin, MD,ED Provider    Daron Offer, NP  08/12/17  4:20 PM      Right scaphoid fracture.  Will place in thumb spica splint.  Tylenol/ibuprofen for pain control.  Follow-up with [facility] or [facility].  Gave referral for Dr. Herschel Senegal or Dr. Pam Drown.  Discussed return precautions.    5:44 PM  Patient re-evaluated.  All questions answered.  Patient appropriate for discharge.  Given return precautions and follow up instructions.     LABORATORY TESTS:  Labs Reviewed - No data to display    IMAGING RESULTS:  Xr Wrist Rt Ap/lat/obl Min 3v    Result Date: 08/12/2017  EXAM: XR WRIST RT AP/LAT/OBL MIN 3V INDICATION: Fell down stairs. Snuff box tenderness. COMPARISON: None. FINDINGS: Three  views of the right wrist demonstrate subtle lucency at the junction of the waist and distal pole consistent with nondisplaced fracture. No dislocation or other fracture is shown. The soft tissues are within normal limits.     IMPRESSION: Scaphoid fracture at junction of waist and distal pole.    Xr 4th Finger Rt Min 2 V    Result Date: 08/12/2017  EXAM: XR 4TH FINGER RT MIN 2 V INDICATION: Status post fall with right 4th finger pain. COMPARISON: None. FINDINGS: Three views of the right fourth finger demonstrate no fracture or other acute osseous or articular abnormality. The soft tissues are within normal limits. As on wrist radiographs, note is made presence of scaphoid fracture.      IMPRESSION: No acute finger abnormality. Scaphoid fracture shown.      MEDICATIONS GIVEN:  Medications - No data to display    IMPRESSION:  1. Closed nondisplaced fracture of scaphoid of right wrist, unspecified portion of scaphoid, initial encounter        PLAN:  1.   Current Discharge Medication List  START taking these medications    Details   ibuprofen (MOTRIN) 600 mg tablet Take 1 Tab by mouth every six (6) hours as needed for Pain.  Qty: 30 Tab, Refills: 0       acetaminophen (TYLENOL) 500 mg tablet Take 2 Tabs by mouth every six (6) hours as needed for Pain. Indications: Pain  Qty: 30 Tab, Refills: 0           2.   Follow-up Information     Follow up With Specialties Details Why Contact Info    Vernard Gambles, MD Orthopedic Surgery Schedule an appointment as soon as possible for a visit  [address]  [place] [address]  [phone]      Terald Sleeper, MD Orthopedic Surgery Schedule an appointment as soon as possible for a visit  [address]  [place] [address]  [place], [place], DO Internal Medicine Schedule an appointment as soon as possible for a visit As needed [address] [place]  [place] [address]  [phone]      [facility]  As needed, If symptoms worsen [place]  [phone]        3.     Return to ED for new or worsening symptoms       Daron Offer, NP                Please note that this dictation was completed with Dragon, the computer voice recognition software.  Quite often unanticipated grammatical, syntax, homophones, and other interpretive errors are inadvertently transcribed by the computer software.  Please disregard these errors.  Please excuse any errors that have escaped final proofreading.

## 2017-08-12 NOTE — ED Notes (Signed)
Thumb gutter splint applied by Ailene Ravel PCT, clean and intact, full sensation to finger, skin warm and pink.

## 2017-10-04 ENCOUNTER — Inpatient Hospital Stay
Admit: 2017-10-04 | Discharge: 2017-10-04 | Disposition: A | Discharge status: Home / Self Care | Payer: BLUE CROSS/BLUE SHIELD | Attending: Emergency Medicine

## 2017-10-04 ENCOUNTER — Emergency Department: Admit: 2017-10-04 | Payer: BLUE CROSS/BLUE SHIELD | Primary: Internal Medicine

## 2017-10-04 DIAGNOSIS — M79631 Pain in right forearm: Secondary | ICD-10-CM

## 2017-10-04 MED ORDER — DICLOFENAC 50 MG TAB
50 mg | ORAL_TABLET | Freq: Three times a day (TID) | ORAL | 0 refills | Status: AC
Start: 2017-10-04 — End: ?

## 2017-10-04 MED ORDER — KETOROLAC TROMETHAMINE 30 MG/ML INJECTION
30 mg/mL (1 mL) | INTRAMUSCULAR | Status: AC
Start: 2017-10-04 — End: 2017-10-04
  Administered 2017-10-04: 16:00:00 via INTRAMUSCULAR

## 2017-10-04 MED FILL — KETOROLAC TROMETHAMINE 30 MG/ML INJECTION: 30 mg/mL (1 mL) | INTRAMUSCULAR | Qty: 1

## 2017-10-04 NOTE — ED Provider Notes (Signed)
ED Provider Notes by Leanord Hawking, PA at 10/04/17 1153                Author: Leanord Hawking, PA  Service: Emergency Medicine  Author Type: Physician Assistant       Filed: 10/04/17 1256  Date of Service: 10/04/17 1153  Status: Attested           Editor: Leanord Hawking, PA (Physician Assistant)  Cosigner: Isabella Stalling, MD at 10/04/17 1336          Attestation signed by Isabella Stalling, MD at 10/04/17 1336          I was personally available for consultation in the emergency department.  I have reviewed the chart and agree with the documentation recorded by the [facility], including  the assessment, treatment plan, and disposition.   Isabella Stalling, MD                                    47 year old female history of MI, high cholesterol, hypertension, anxiety, panic attacks presenting to the ER for  right wrist pain.  Patient notes that she was seen here in July for a fall and was diagnosed with a wrist fracture, chart review indicates pt had scaphoid fracture.  Patient notes that she has since been following with Dr. Herschel Senegal, [facility].  Patient  notes that since the fall she has had moderately severe pain in the right fourth finger, right wrist, and right forearm.  Notes that she had her cast removed about 10 days ago and was placed in a Velcro immobilizer.  Patient notes that she has had worsening  pain since then, describes a severe, "toothache" like pain in the arm, worse with movement.  Patient has tried over-the-counter medications with no relief.  Also notes that the forearm and hand have been swollen.  Had physical therapy yesterday.  No new  injury.  No fever or other concerns.      Medical history: As above                  Past Medical History:        Diagnosis  Date         ?  Elevated cholesterol       ?  Hypertension       ?  MI (myocardial infarction) ([place])       ?  Psychiatric disorder            anxiety, panic attacks             Past Surgical History:         Procedure   Laterality  Date          ?  CARDIAC SURG PROCEDURE UNLIST              w/o stents          ?  HX TUBAL LIGATION                 History reviewed. No pertinent family history.        Social History          Socioeconomic History         ?  Marital status:  SINGLE              Spouse name:  Not on file         ?  Number of children:  Not on file     ?  Years of education:  Not on file     ?  Highest education level:  Not on file       Occupational History        ?  Not on file       Social Needs         ?  Financial resource strain:  Not on file        ?  Food insecurity:              Worry:  Not on file         Inability:  Not on file        ?  Transportation needs:              Medical:  Not on file         Non-medical:  Not on file       Tobacco Use         ?  Smoking status:  Former Smoker     ?  Smokeless tobacco:  Never Used       Substance and Sexual Activity         ?  Alcohol use:  Yes             Comment: Seldom         ?  Drug use:  No     ?  Sexual activity:  Not on file       Lifestyle        ?  Physical activity:              Days per week:  Not on file         Minutes per session:  Not on file         ?  Stress:  Not on file       Relationships        ?  Social connections:              Talks on phone:  Not on file         Gets together:  Not on file         Attends religious service:  Not on file         Active member of club or organization:  Not on file         Attends meetings of clubs or organizations:  Not on file         Relationship status:  Not on file        ?  Intimate partner violence:              Fear of current or ex partner:  Not on file         Emotionally abused:  Not on file         Physically abused:  Not on file         Forced sexual activity:  Not on file        Other Topics  Concern        ?  Not on file       Social History Narrative        ?  Not on file              ALLERGIES: Contrast agent [iodine]; Darvocet a500 [propoxyphene n-acetaminophen]; Norvasc [amlodipine]; and  Vicodin [hydrocodone-acetaminophen]      Review  of Systems    Constitutional: Negative for fever.    Gastrointestinal: Negative for vomiting.    Musculoskeletal:         + Wrist pain   + Swelling    Skin: Negative for wound.    All other systems reviewed and are negative.           Vitals:          10/04/17 1117        BP:  144/88     Pulse:  88     Resp:  17     Temp:  98.8 ??F (37.1 ??C)        SpO2:  96%                Physical Exam    Constitutional: She is oriented to person, place, and time. She appears well-developed and well-nourished.   Pleasant African-American female     HENT:    Head: Normocephalic.    Eyes: Conjunctivae are normal.    Neck: Neck supple.    Cardiovascular: Normal rate.    Pulmonary/Chest: Effort normal. No respiratory distress.   Musculoskeletal: Normal range of motion.    Right arm: Velcro wrist immobilizer in place.  Removed for exam.  Mild swelling of the entire forearm and dorsum of the right hand.  Patient able to flex and extend fingers, but flexion limited secondary to pain.  Diffuse tenderness of the entire upper  extremity from the elbow to the fingers.  Strong radial pulse noted.  Brisk cap refill.  Sensation intact.    Neurological: She is alert and oriented to person, place, and time.    Skin: Skin is warm and dry.   Psychiatric: She has a normal mood and affect.    Nursing note and vitals reviewed.          MDM   Number of Diagnoses or Management Options   Right forearm pain:    Diagnosis management comments: 47 year old female presented to the ER for chronic pain of the right upper extremity since a fall in July, found to have wrist fracture at that time.  Has since followed  up with [facility] but notes that aching pain has been worsening.  States she was told by physical therapy that there may be some nerve damage.  Given swelling and worsening pain, will order duplex and check repeat x-ray.      Duplex negative, no fracture noted on x-ray.  Discussed possible causes of  pain with patient.  Will write for prescription strength NSAID, encourage patient to follow-up with her orthopedic surgeon.          Amount and/or Complexity of Data Reviewed   Tests in the radiology section of CPT??: ordered and reviewed   Review and summarize past medical records: yes   Discuss the patient with other providers: yes  (Dr. Hoyle Barr, ED attending)                Procedures

## 2017-10-04 NOTE — ED Notes (Signed)
Discharge instructions given to pt.  All questions answered and pt verbalized understanding.   V/S stable @ time of discharge.  Pt ambulatory out of unit.

## 2017-10-04 NOTE — ED Notes (Signed)
Arrived form home c/o right wrist pain. Broke wrat in two placed in July.  Patient arrived to ED with compression  wrap and brace to wrist right.  Patient feels like a throbbing pain in hand, all fingers and wrist. August 23rd cast removed. Patient started PT yesterday

## 2017-10-04 NOTE — ED Provider Notes (Signed)
47 year old female history of MI, high cholesterol, hypertension, anxiety, panic attacks presenting to the ER for right wrist pain.  Patient notes that she was seen here in July for a fall and was diagnosed with a wrist fracture, chart review indicates pt had scaphoid fracture.  Patient notes that she has since been following with Dr. Herschel Senegal, [facility].  Patient notes that since the fall she has had moderately severe pain in the right fourth finger, right wrist, and right forearm.  Notes that she had her cast removed about 10 days ago and was placed in a Velcro immobilizer.  Patient notes that she has had worsening pain since then, describes a severe, "toothache" like pain in the arm, worse with movement.  Patient has tried over-the-counter medications with no relief.  Also notes that the forearm and hand have been swollen.  Had physical therapy yesterday.  No new injury.  No fever or other concerns.    Medical history: As above           Past Medical History:   Diagnosis Date   ??? Elevated cholesterol    ??? Hypertension    ??? MI (myocardial infarction) ([place])    ??? Psychiatric disorder     anxiety, panic attacks       Past Surgical History:   Procedure Laterality Date   ??? CARDIAC SURG PROCEDURE UNLIST      w/o stents   ??? HX TUBAL LIGATION           History reviewed. No pertinent family history.    Social History     Socioeconomic History   ??? Marital status: SINGLE     Spouse name: Not on file   ??? Number of children: Not on file   ??? Years of education: Not on file   ??? Highest education level: Not on file   Occupational History   ??? Not on file   Social Needs   ??? Financial resource strain: Not on file   ??? Food insecurity:     Worry: Not on file     Inability: Not on file   ??? Transportation needs:     Medical: Not on file     Non-medical: Not on file   Tobacco Use   ??? Smoking status: Former Smoker   ??? Smokeless tobacco: Never Used   Substance and Sexual Activity   ??? Alcohol use: Yes     Comment: Seldom    ??? Drug use: No   ??? Sexual activity: Not on file   Lifestyle   ??? Physical activity:     Days per week: Not on file     Minutes per session: Not on file   ??? Stress: Not on file   Relationships   ??? Social connections:     Talks on phone: Not on file     Gets together: Not on file     Attends religious service: Not on file     Active member of club or organization: Not on file     Attends meetings of clubs or organizations: Not on file     Relationship status: Not on file   ??? Intimate partner violence:     Fear of current or ex partner: Not on file     Emotionally abused: Not on file     Physically abused: Not on file     Forced sexual activity: Not on file   Other Topics Concern   ??? Not on file  Social History Narrative   ??? Not on file         ALLERGIES: Contrast agent [iodine]; Darvocet a500 [propoxyphene n-acetaminophen]; Norvasc [amlodipine]; and Vicodin [hydrocodone-acetaminophen]    Review of Systems   Constitutional: Negative for fever.   Gastrointestinal: Negative for vomiting.   Musculoskeletal:        + Wrist pain  + Swelling   Skin: Negative for wound.   All other systems reviewed and are negative.      Vitals:    10/04/17 1117   BP: 144/88   Pulse: 88   Resp: 17   Temp: 98.8 ??F (37.1 ??C)   SpO2: 96%            Physical Exam   Constitutional: She is oriented to person, place, and time. She appears well-developed and well-nourished.   Pleasant African-American female   HENT:   Head: Normocephalic.   Eyes: Conjunctivae are normal.   Neck: Neck supple.   Cardiovascular: Normal rate.   Pulmonary/Chest: Effort normal. No respiratory distress.   Musculoskeletal: Normal range of motion.   Right arm: Velcro wrist immobilizer in place.  Removed for exam.  Mild swelling of the entire forearm and dorsum of the right hand.  Patient able to flex and extend fingers, but flexion limited secondary to pain.  Diffuse tenderness of the entire upper extremity from the elbow to the  fingers.  Strong radial pulse noted.  Brisk cap refill.  Sensation intact.   Neurological: She is alert and oriented to person, place, and time.   Skin: Skin is warm and dry.   Psychiatric: She has a normal mood and affect.   Nursing note and vitals reviewed.       MDM  Number of Diagnoses or Management Options  Right forearm pain:   Diagnosis management comments: 47 year old female presented to the ER for chronic pain of the right upper extremity since a fall in July, found to have wrist fracture at that time.  Has since followed up with Ortho [facility] but notes that aching pain has been worsening.  States she was told by physical therapy that there may be some nerve damage.  Given swelling and worsening pain, will order duplex and check repeat x-ray.    Duplex negative, no fracture noted on x-ray.  Discussed possible causes of pain with patient.  Will write for prescription strength NSAID, encourage patient to follow-up with her orthopedic surgeon.       Amount and/or Complexity of Data Reviewed  Tests in the radiology section of CPT??: ordered and reviewed  Review and summarize past medical records: yes  Discuss the patient with other providers: yes (Dr. Hoyle Barr, ED attending)           Procedures

## 2017-10-04 NOTE — ED Triage Notes (Signed)
Arrived form home c/o right wrist pain. Broke wrat in two placed in July.  Patient arrived to ED with compression  wrap and brace to wrist right.  Patient feels like a throbbing pain in hand, all fingers and wrist. August 23rd cast removed. Patient started PT yesterday

## 2017-11-04 ENCOUNTER — Inpatient Hospital Stay
Admit: 2017-11-04 | Discharge: 2017-11-04 | Disposition: A | Discharge status: Home / Self Care | Payer: BLUE CROSS/BLUE SHIELD | Attending: Student in an Organized Health Care Education/Training Program

## 2017-11-04 DIAGNOSIS — L309 Dermatitis, unspecified: Secondary | ICD-10-CM

## 2017-11-04 MED ORDER — TERBINAFINE 1 % TOPICAL CREAM
1 % | Freq: Two times a day (BID) | CUTANEOUS | 0 refills | Status: AC
Start: 2017-11-04 — End: ?

## 2017-11-04 NOTE — ED Notes (Signed)
 Provider reviewed discharge instructions with the patient.  The patient verbalized understanding. Pt ambulated out of ED.

## 2017-11-04 NOTE — ED Notes (Signed)
Pt reports raised red area on L hand x3 days.

## 2017-11-04 NOTE — ED Provider Notes (Signed)
ED Provider Notes by Bertrum Sol C, [place] at 11/04/17 6962                Author: Rose Fillers, [place]  Service: Emergency Medicine  Author Type: Physician Assistant       Filed: 11/04/17 1908  Date of Service: 11/04/17 1854  Status: Attested           Editor: Foust-Ward, Torian Quintero C, [place] (Physician Assistant)  Cosigner: Andrey Farmer, MD at 11/04/17 2216          Attestation signed by Andrey Farmer, MD at 11/04/17 2216          I was personally available for consultation in the emergency department.  I have reviewed the chart and agree with the documentation recorded by the [facility], including  the assessment, treatment plan, and disposition.   Andrey Farmer, MD                                    47 y.o. female with past medical history significant for MI, anxiety, HCL, HTN who presents from home with chief  complaint of skin issue. Pt states 2-3 days ago she noticed a slightly raised area on the top of her left hand. She denies any itching or pain to the area. She reports using an OTC anti fungal cream on the area, concerned it was ringworm, without any  improvement. Pt further notes the area has started to increase in size, prompting her visit to the Ed. Of note, the pt is currently in hand therapy s/p a right hand surgery in July. She denies having any pets or animals at home; does not remember burning  the area. Pt denies any new or different products including lotions, soaps detergents. Pt states she otherwise feels fine. Pt specifically denies any fever, chills, headache, cough, congestion, shortness of breath, chest pain, abdominal pain, nausea,  vomiting, diarrhea.      There are no other acute medical concerns at this time.      PSHx: Significant for cardiac catheterization, tubal ligation    Social Hx: negative tobacco use(former smoker), rare EtOH use, negative Illicit Drug use      PCP: Nicholes Rough, DO      Note written by Laverle Hobby, Scribe, as dictated by Sidney Silberman  Foust-Ward, PA-C 6:54 PM      The history is provided by the patient. No language interpreter  was used.             Past Medical History:        Diagnosis  Date         ?  Elevated cholesterol       ?  Hypertension       ?  MI (myocardial infarction) ([place])       ?  Psychiatric disorder            anxiety, panic attacks             Past Surgical History:         Procedure  Laterality  Date          ?  CARDIAC SURG PROCEDURE UNLIST              w/o stents          ?  HX TUBAL LIGATION  No family history on file.        Social History          Socioeconomic History         ?  Marital status:  SINGLE              Spouse name:  Not on file         ?  Number of children:  Not on file     ?  Years of education:  Not on file     ?  Highest education level:  Not on file       Occupational History        ?  Not on file       Social Needs         ?  Financial resource strain:  Not on file        ?  Food insecurity:              Worry:  Not on file         Inability:  Not on file        ?  Transportation needs:              Medical:  Not on file         Non-medical:  Not on file       Tobacco Use         ?  Smoking status:  Former Smoker     ?  Smokeless tobacco:  Never Used       Substance and Sexual Activity         ?  Alcohol use:  Yes             Comment: Seldom         ?  Drug use:  No     ?  Sexual activity:  Not on file       Lifestyle        ?  Physical activity:              Days per week:  Not on file         Minutes per session:  Not on file         ?  Stress:  Not on file       Relationships        ?  Social connections:              Talks on phone:  Not on file         Gets together:  Not on file         Attends religious service:  Not on file         Active member of club or organization:  Not on file         Attends meetings of clubs or organizations:  Not on file         Relationship status:  Not on file        ?  Intimate partner violence:              Fear of current or ex partner:  Not on file          Emotionally abused:  Not on file         Physically abused:  Not on file         Forced sexual activity:  Not on file        Other  Topics  Concern        ?  Not on file       Social History Narrative        ?  Not on file              ALLERGIES: Contrast agent [iodine]; Darvocet a500 [propoxyphene n-acetaminophen]; Norvasc [amlodipine]; and Vicodin [hydrocodone-acetaminophen]      Review of Systems    Constitutional: Negative for chills and fever.    Respiratory: Negative for cough and shortness of breath.     Cardiovascular: Negative for chest pain.    Gastrointestinal: Negative for abdominal pain, diarrhea, nausea and vomiting.    Genitourinary: Negative for dysuria and frequency.    Musculoskeletal: Negative for myalgias.    Skin: Positive for rash.    Neurological: Negative for headaches.    All other systems reviewed and are negative.         Visit Vitals      BP  147/84 (BP 1 Location: Right arm, BP Patient Position: At rest)     Pulse  98     Temp  99.3 ??F (37.4 ??C)     Resp  18     Ht  4\' 11"  (1.499 m)     Wt  75.8 kg (167 lb)     SpO2  95%        BMI  33.73 kg/m??                 Physical Exam    Constitutional: She is oriented to person, place, and time. She appears well-developed and well-nourished. No distress.   Well appearing AAF     HENT:    Head: Normocephalic and atraumatic.   Right Ear: External ear normal.   Left Ear: External ear normal.    Nose: Nose normal.    Mouth/Throat: Oropharynx is clear and moist. No oropharyngeal exudate.    Eyes: Pupils are equal, round, and reactive to light. Conjunctivae are normal.    Cardiovascular: Normal rate, regular rhythm and normal heart sounds.    Pulmonary/Chest: Effort normal. No respiratory distress. She has no wheezes.    Neurological: She is alert and oriented to person, place, and time.    Skin: Skin is warm and dry. No ecchymosis, no laceration and no lesion noted.           Nursing note and vitals reviewed.          MDM   Number of Diagnoses  or Management Options   Dermatitis:    Diagnosis management comments: 47 yo female with complaint of lesion to the left hand. DDx includes tinea corporis vs pityriasis rosaea             Procedures         Progress note      Patient's results have been reviewed with them.  Patient and/or family have verbally conveyed their understanding and agreement of the patient's signs, symptoms, diagnosis, treatment and prognosis and additionally agree to follow up as recommended or  return to the Emergency Room should their condition change prior to follow-up.  Discharge instructions have also been provided to the patient with some educational information regarding their diagnosis as well a list of reasons why they would want to  return to the ER prior to their follow-up appointment should their condition change. Deoni Cosey C [place], [place]

## 2017-11-04 NOTE — ED Provider Notes (Signed)
47 y.o. female with past medical history significant for MI, anxiety, HCL, HTN who presents from home with chief complaint of skin issue. Pt states 2-3 days ago she noticed a slightly raised area on the top of her left hand. She denies any itching or pain to the area. She reports using an OTC anti fungal cream on the area, concerned it was ringworm, without any improvement. Pt further notes the area has started to increase in size, prompting her visit to the Ed. Of note, the pt is currently in hand therapy s/p a right hand surgery in July. She denies having any pets or animals at home; does not remember burning the area. Pt denies any new or different products including lotions, soaps detergents. Pt states she otherwise feels fine. Pt specifically denies any fever, chills, headache, cough, congestion, shortness of breath, chest pain, abdominal pain, nausea, vomiting, diarrhea.    There are no other acute medical concerns at this time.    PSHx: Significant for cardiac catheterization, tubal ligation   Social Hx: negative tobacco use(former smoker), rare EtOH use, negative Illicit Drug use    PCP: Nicholes Rough, DO    Note written by Laverle Hobby, Scribe, as dictated by Senovia Gauer Foust-Ward, PA-C 6:54 PM    The history is provided by the patient. No language interpreter was used.        Past Medical History:   Diagnosis Date   ??? Elevated cholesterol    ??? Hypertension    ??? MI (myocardial infarction) ([place])    ??? Psychiatric disorder     anxiety, panic attacks       Past Surgical History:   Procedure Laterality Date   ??? CARDIAC SURG PROCEDURE UNLIST      w/o stents   ??? HX TUBAL LIGATION           No family history on file.    Social History     Socioeconomic History   ??? Marital status: SINGLE     Spouse name: Not on file   ??? Number of children: Not on file   ??? Years of education: Not on file   ??? Highest education level: Not on file   Occupational History   ??? Not on file   Social Needs    ??? Financial resource strain: Not on file   ??? Food insecurity:     Worry: Not on file     Inability: Not on file   ??? Transportation needs:     Medical: Not on file     Non-medical: Not on file   Tobacco Use   ??? Smoking status: Former Smoker   ??? Smokeless tobacco: Never Used   Substance and Sexual Activity   ??? Alcohol use: Yes     Comment: Seldom   ??? Drug use: No   ??? Sexual activity: Not on file   Lifestyle   ??? Physical activity:     Days per week: Not on file     Minutes per session: Not on file   ??? Stress: Not on file   Relationships   ??? Social connections:     Talks on phone: Not on file     Gets together: Not on file     Attends religious service: Not on file     Active member of club or organization: Not on file     Attends meetings of clubs or organizations: Not on file     Relationship status: Not on  file   ??? Intimate partner violence:     Fear of current or ex partner: Not on file     Emotionally abused: Not on file     Physically abused: Not on file     Forced sexual activity: Not on file   Other Topics Concern   ??? Not on file   Social History Narrative   ??? Not on file         ALLERGIES: Contrast agent [iodine]; Darvocet a500 [propoxyphene n-acetaminophen]; Norvasc [amlodipine]; and Vicodin [hydrocodone-acetaminophen]    Review of Systems   Constitutional: Negative for chills and fever.   Respiratory: Negative for cough and shortness of breath.    Cardiovascular: Negative for chest pain.   Gastrointestinal: Negative for abdominal pain, diarrhea, nausea and vomiting.   Genitourinary: Negative for dysuria and frequency.   Musculoskeletal: Negative for myalgias.   Skin: Positive for rash.   Neurological: Negative for headaches.   All other systems reviewed and are negative.      Visit Vitals  BP 147/84 (BP 1 Location: Right arm, BP Patient Position: At rest)   Pulse 98   Temp 99.3 ??F (37.4 ??C)   Resp 18   Ht 4\' 11"  (1.499 m)   Wt 75.8 kg (167 lb)   SpO2 95%   BMI 33.73 kg/m??            Physical Exam    Constitutional: She is oriented to person, place, and time. She appears well-developed and well-nourished. No distress.   Well appearing AAF   HENT:   Head: Normocephalic and atraumatic.   Right Ear: External ear normal.   Left Ear: External ear normal.   Nose: Nose normal.   Mouth/Throat: Oropharynx is clear and moist. No oropharyngeal exudate.   Eyes: Pupils are equal, round, and reactive to light. Conjunctivae are normal.   Cardiovascular: Normal rate, regular rhythm and normal heart sounds.   Pulmonary/Chest: Effort normal. No respiratory distress. She has no wheezes.   Neurological: She is alert and oriented to person, place, and time.   Skin: Skin is warm and dry. No ecchymosis, no laceration and no lesion noted.        Nursing note and vitals reviewed.       MDM  Number of Diagnoses or Management Options  Dermatitis:   Diagnosis management comments: 47 yo female with complaint of lesion to the left hand. DDx includes tinea corporis vs pityriasis rosaea         Procedures      Progress note    Patient's results have been reviewed with them.  Patient and/or family have verbally conveyed their understanding and agreement of the patient's signs, symptoms, diagnosis, treatment and prognosis and additionally agree to follow up as recommended or return to the Emergency Room should their condition change prior to follow-up.  Discharge instructions have also been provided to the patient with some educational information regarding their diagnosis as well a list of reasons why they would want to return to the ER prior to their follow-up appointment should their condition change. Micheala Morissette C [place], [place]

## 2017-11-04 NOTE — ED Notes (Signed)
Provider reviewed discharge instructions with the patient.  The patient verbalized understanding. Pt ambulated out of ED.

## 2017-11-04 NOTE — ED Triage Notes (Signed)
Pt reports raised red area on L hand x3 days.

## 2017-12-24 ENCOUNTER — Encounter

## 2017-12-29 ENCOUNTER — Inpatient Hospital Stay: Admit: 2017-12-29 | Payer: Worker's Compensation | Attending: Orthopaedic Surgery | Primary: Internal Medicine

## 2017-12-29 DIAGNOSIS — G9050 Complex regional pain syndrome I, unspecified: Secondary | ICD-10-CM

## 2017-12-29 MED ORDER — TECHNETIUM TC 99M MEDRONATE IV KIT
Freq: Once | Status: AC
Start: 2017-12-29 — End: 2017-12-29
  Administered 2017-12-29: 17:00:00 via INTRAVENOUS

## 2018-02-05 ENCOUNTER — Emergency Department: Admit: 2018-02-05 | Payer: BLUE CROSS/BLUE SHIELD | Primary: Internal Medicine

## 2018-02-05 ENCOUNTER — Inpatient Hospital Stay
Admit: 2018-02-05 | Discharge: 2018-02-06 | Disposition: A | Discharge status: Home / Self Care | Payer: BLUE CROSS/BLUE SHIELD | Attending: Emergency Medicine

## 2018-02-05 DIAGNOSIS — R51 Headache: Secondary | ICD-10-CM

## 2018-02-05 LAB — BASIC METABOLIC PANEL
Anion Gap: 5 mmol/L (ref 5–15)
BUN: 10 MG/DL (ref 6–20)
Bun/Cre Ratio: 13 (ref 12–20)
CO2: 29 mmol/L (ref 21–32)
Calcium: 9.2 MG/DL (ref 8.5–10.1)
Chloride: 101 mmol/L (ref 97–108)
Creatinine: 0.76 MG/DL (ref 0.55–1.02)
EGFR IF NonAfrican American: >60 mL/min/{1.73_m2} (ref >60)
GFR African American: >60 mL/min/{1.73_m2} (ref >60)
Glucose: 83 mg/dL (ref 65–100)
Potassium: 4 mmol/L (ref 3.5–5.1)
Sodium: 135 mmol/L — ABNORMAL LOW (ref 136–145)

## 2018-02-05 LAB — CBC WITH AUTO DIFFERENTIAL
Basophils %: 0 % (ref 0–1)
Basophils Absolute: 0 10*3/uL (ref 0.0–0.1)
Eosinophils %: 2 % (ref 0–7)
Eosinophils Absolute: 0.2 10*3/uL (ref 0.0–0.4)
Granulocyte Absolute Count: 0 10*3/uL (ref 0.00–0.04)
Hematocrit: 36.7 % (ref 35.0–47.0)
Hemoglobin: 11.6 g/dL (ref 11.5–16.0)
Immature Granulocytes: 0 % (ref 0.0–0.5)
Lymphocytes %: 34 % (ref 12–49)
Lymphocytes Absolute: 3.3 10*3/uL (ref 0.8–3.5)
MCH: 24.9 PG — ABNORMAL LOW (ref 26.0–34.0)
MCHC: 31.6 g/dL (ref 30.0–36.5)
MCV: 78.8 FL — ABNORMAL LOW (ref 80.0–99.0)
MPV: 10 FL (ref 8.9–12.9)
Monocytes %: 9 % (ref 5–13)
Monocytes Absolute: 0.9 10*3/uL (ref 0.0–1.0)
NRBC Absolute: 0 10*3/uL (ref 0.00–0.01)
Neutrophils %: 55 % (ref 32–75)
Neutrophils Absolute: 5.4 10*3/uL (ref 1.8–8.0)
Nucleated RBCs: 0 PER 100 WBC
Platelets: 420 10*3/uL — ABNORMAL HIGH (ref 150–400)
RBC: 4.66 M/uL (ref 3.80–5.20)
RDW: 17.8 % — ABNORMAL HIGH (ref 11.5–14.5)
WBC: 9.7 10*3/uL (ref 3.6–11.0)

## 2018-02-05 LAB — POC GROUP A STREP
GROUP A STREP-POC,POCGPA: NEGATIVE
Group A strep (POC): NEGATIVE

## 2018-02-05 LAB — CBC WITH AUTOMATED DIFF
ABS. BASOPHILS: 0 10*3/uL (ref 0.0–0.1)
ABS. EOSINOPHILS: 0.2 10*3/uL (ref 0.0–0.4)
ABS. IMM. GRANS.: 0 10*3/uL (ref 0.00–0.04)
ABS. LYMPHOCYTES: 3.3 10*3/uL (ref 0.8–3.5)
ABS. MONOCYTES: 0.9 10*3/uL (ref 0.0–1.0)
ABS. NEUTROPHILS: 5.4 10*3/uL (ref 1.8–8.0)
ABSOLUTE NRBC: 0 10*3/uL (ref 0.00–0.01)
BASOPHILS: 0 % (ref 0–1)
EOSINOPHILS: 2 % (ref 0–7)
HCT: 36.7 % (ref 35.0–47.0)
HGB: 11.6 g/dL (ref 11.5–16.0)
IMMATURE GRANULOCYTES: 0 % (ref 0.0–0.5)
LYMPHOCYTES: 34 % (ref 12–49)
MCH: 24.9 PG — ABNORMAL LOW (ref 26.0–34.0)
MCHC: 31.6 g/dL (ref 30.0–36.5)
MCV: 78.8 FL — ABNORMAL LOW (ref 80.0–99.0)
MONOCYTES: 9 % (ref 5–13)
MPV: 10 FL (ref 8.9–12.9)
NEUTROPHILS: 55 % (ref 32–75)
NRBC: 0 PER 100 WBC
PLATELET: 420 10*3/uL — ABNORMAL HIGH (ref 150–400)
RBC: 4.66 M/uL (ref 3.80–5.20)
RDW: 17.8 % — ABNORMAL HIGH (ref 11.5–14.5)
WBC: 9.7 10*3/uL (ref 3.6–11.0)

## 2018-02-05 LAB — SAMPLES BEING HELD

## 2018-02-05 LAB — METABOLIC PANEL, BASIC
Anion gap: 5 mmol/L (ref 5–15)
BUN/Creatinine ratio: 13 (ref 12–20)
BUN: 10 MG/DL (ref 6–20)
CO2: 29 mmol/L (ref 21–32)
Calcium: 9.2 MG/DL (ref 8.5–10.1)
Chloride: 101 mmol/L (ref 97–108)
Creatinine: 0.76 MG/DL (ref 0.55–1.02)
GFR est AA: >60 mL/min/{1.73_m2} (ref >60)
GFR est non-AA: >60 mL/min/{1.73_m2} (ref >60)
Glucose: 83 mg/dL (ref 65–100)
Potassium: 4 mmol/L (ref 3.5–5.1)
Sodium: 135 mmol/L — ABNORMAL LOW (ref 136–145)

## 2018-02-05 MED ORDER — SODIUM CHLORIDE 0.9 % IV
Freq: Once | INTRAVENOUS | Status: AC
Start: 2018-02-05 — End: 2018-02-05
  Administered 2018-02-05: 23:00:00 via INTRAVENOUS

## 2018-02-05 MED ORDER — METOCLOPRAMIDE 5 MG/ML IJ SOLN
5 mg/mL | INTRAMUSCULAR | Status: AC
Start: 2018-02-05 — End: 2018-02-05
  Administered 2018-02-05: 23:00:00 via INTRAVENOUS

## 2018-02-05 MED ORDER — KETOROLAC TROMETHAMINE 30 MG/ML INJECTION
30 mg/mL (1 mL) | INTRAMUSCULAR | Status: AC
Start: 2018-02-05 — End: 2018-02-05
  Administered 2018-02-05: 23:00:00 via INTRAVENOUS

## 2018-02-05 MED FILL — SODIUM CHLORIDE 0.9 % IV: INTRAVENOUS | Qty: 1000

## 2018-02-05 MED FILL — KETOROLAC TROMETHAMINE 30 MG/ML INJECTION: 30 mg/mL (1 mL) | INTRAMUSCULAR | Qty: 1

## 2018-02-05 MED FILL — METOCLOPRAMIDE 5 MG/ML IJ SOLN: 5 mg/mL | INTRAMUSCULAR | Qty: 2

## 2018-02-05 NOTE — ED Provider Notes (Signed)
ED Provider Notes by Lafe Garin, PA-C at 02/05/18 1722                Author: Lafe Garin, PA-C  Service: Emergency Medicine  Author Type: Physician Assistant       Filed: 02/05/18 2047  Date of Service: 02/05/18 1722  Status: Attested           Editor: Einar Crow (Physician Assistant)  Cosigner: Myles Rosenthal, DO at 02/09/18 4540          Attestation signed by Myles Rosenthal, DO at 02/09/18 0703          I was personally available for consultation in the emergency department.  I have reviewed the chart and agree with the documentation recorded by the [facility], including  the assessment, treatment plan, and disposition.   Myles Rosenthal, DO                                    48 year old female presents to the emergency room for evaluation of headache, fatigue, sore throat and overall  not feeling well.  Symptoms have been ongoing since Tuesday.  Patient reports headache is been going on for a week.  Pain in the head is described as a pressure sensation.  Located in the back of the head radiating to the front.  Patient has associated  nausea but no vomiting.  No fevers or chills.  No chest pain, shortness of breath or difficulty breathing.  No dysuria frequency urgency.  No stomach pain.  No muscle aches or body aches.  No dizziness or lightheadedness.  No blurry vision or double vision.   Patient reports photophobia.  She has not taken anything for her symptoms.  No known precipitating events.  No alleviating factors. Pressure 8/10.      Social hx   Nonsmoker   Occasional alcohol      The history is provided by the patient.             Past Medical History:        Diagnosis  Date         ?  Elevated cholesterol       ?  Hypertension       ?  MI (myocardial infarction) ([place])       ?  Psychiatric disorder            anxiety, panic attacks             Past Surgical History:         Procedure  Laterality  Date          ?  CARDIAC SURG PROCEDURE UNLIST              w/o stents           ?  HX TUBAL LIGATION                 History reviewed. No pertinent family history.        Social History          Socioeconomic History         ?  Marital status:  SINGLE              Spouse name:  Not on file         ?  Number of children:  Not on file     ?  Years of education:  Not on file     ?  Highest education level:  Not on file       Occupational History        ?  Not on file       Social Needs         ?  Financial resource strain:  Not on file        ?  Food insecurity:              Worry:  Not on file         Inability:  Not on file        ?  Transportation needs:              Medical:  Not on file         Non-medical:  Not on file       Tobacco Use         ?  Smoking status:  Former Smoker     ?  Smokeless tobacco:  Never Used       Substance and Sexual Activity         ?  Alcohol use:  Yes             Comment: Seldom         ?  Drug use:  No     ?  Sexual activity:  Not on file       Lifestyle        ?  Physical activity:              Days per week:  Not on file         Minutes per session:  Not on file         ?  Stress:  Not on file       Relationships        ?  Social connections:              Talks on phone:  Not on file         Gets together:  Not on file         Attends religious service:  Not on file         Active member of club or organization:  Not on file         Attends meetings of clubs or organizations:  Not on file         Relationship status:  Not on file        ?  Intimate partner violence:              Fear of current or ex partner:  Not on file         Emotionally abused:  Not on file         Physically abused:  Not on file         Forced sexual activity:  Not on file        Other Topics  Concern        ?  Not on file       Social History Narrative        ?  Not on file              ALLERGIES: Contrast agent [iodine]; Darvocet a500 [propoxyphene n-acetaminophen]; Norvasc [amlodipine]; and Vicodin [hydrocodone-acetaminophen]      Review of Systems    Constitutional: Positive for  fatigue. Negative for chills and  fever.    HENT: Positive for sore throat.     Eyes: Positive for photophobia.    Respiratory: Negative for cough, chest tightness and shortness of breath.     Gastrointestinal: Positive for nausea. Negative for abdominal pain.    Genitourinary: Negative for difficulty urinating and dysuria.    Musculoskeletal: Negative for back pain, myalgias, neck pain and neck stiffness.    Skin: Negative for color change and rash.    Neurological: Positive for headaches. Negative for dizziness and light-headedness.            Vitals:          02/05/18 1441        BP:  133/87     Pulse:  96     Resp:  18     Temp:  98.3 ??F (36.8 ??C)     SpO2:  98%     Weight:  77.1 kg (170 lb)        Height:  4\' 11"  (1.499 m)                Physical Exam       Constitutional: Well-developed and well-nourished. No distress.    HENT:   Right Ear: Tympanic membrane normal. No tragal or auricle tenderness.  Left Ear: Tympanic membrane normal.   No tragal or auricle tenderness bilaterally.   No drainage.    Nose: No nasal discharge.    Mouth/Throat: Mucous membranes are moist. No dental caries. No tonsillar exudate. Oropharynx is clear. Pharynx is normal.    Uvula midline, no trismus, drooling, submandibular swelling. Tonsils 2+ bilaterally. No exudates.  Tolerating secretions without problem.  No mastoid tenderness.    Eyes: Conjunctivae are normal. Pupils are equal, round, and reactive to light. Right eye exhibits no discharge. Left eye exhibits no discharge.    Neck: Normal range of motion. Neck supple. No neck rigidity. NO MENINGEAL SIGNS   Cardiovascular: Normal rate and regular rhythm.    Pulmonary/Chest: Effort normal and breath sounds normal. No stridor. No respiratory distress.  no wheezes.  no rales.    Good air movement bilaterally   Abdominal: Soft. There is no hepatosplenomegaly. There  is no rebound and no guarding. No pain with palpation.  Musculoskeletal: Normal range of motion. no signs of injury.     Lymphadenopathy: No cervical adenopathy.   Neurological:  alert.  normal muscle tone.  Coordination normal.    Skin: Skin is warm and dry. No petechiae, no purpura and no rash noted.    Nursing note and vitals reviewed.      mdm   48 year old female presenting for not feeling well, headache, nausea.  She is afebrile.  Nontoxic-appearing.  No meningeal signs.  Abdomen is soft and nontender.  Lungs are clear.  No tachycardia tachypnea or hypoxia.  No hypertension.  He is alert and  oriented.  No meningeal signs.   Plan: Ct, labs, iv fluid.      8:47 PM   Progress Note:    8:47 PM    Pt has been reexamined by Lafe Garin, PA-C. Pt is feeling much better. Symptoms have improved.headache is gone. Patient is well hydrated, well appearing, and in no respiratory distress. Physical exam is reassuring, and without signs of serious illness.    All available results have been reviewed with pt and any available family. Pt understands sx, dx, and tx in ED. Will discharge patient home with supportive care, and follow-up with PCP within the  next few days.      Pt case including HPI, PE, and all available lab and radiology results has been discussed with attending physician. Opportunity to evaluate patient has been provided to ER attending.   Discharge and prescription plan has been agreed upon.

## 2018-02-05 NOTE — ED Notes (Signed)
Triage:  Pt to ED due to continued complaints of headache, neck pain, and generally feels ill.  Pt mentions sensitivity to light and states periods of nausea.

## 2018-02-05 NOTE — ED Notes (Signed)
Provider reviewed discharge instructions and options with patient and patient verbalized understanding. RN reviewed discharge instructions using teachback method. Patient ambulated to exit without difficulty and in no signs of acute distress. Patient was counseled on medications prescribed at discharge. VSS at time of discharge. No complaints, needs, or questions at this time. Patient to call PCP in the morning for follow up.

## 2018-02-05 NOTE — ED Triage Notes (Signed)
Triage:  Pt to ED due to continued complaints of headache, neck pain, and generally feels ill.  Pt mentions sensitivity to light and states periods of nausea.

## 2018-02-05 NOTE — ED Provider Notes (Signed)
48 year old female presents to the emergency room for evaluation of headache, fatigue, sore throat and overall not feeling well.  Symptoms have been ongoing since Tuesday.  Patient reports headache is been going on for a week.  Pain in the head is described as a pressure sensation.  Located in the back of the head radiating to the front.  Patient has associated nausea but no vomiting.  No fevers or chills.  No chest pain, shortness of breath or difficulty breathing.  No dysuria frequency urgency.  No stomach pain.  No muscle aches or body aches.  No dizziness or lightheadedness.  No blurry vision or double vision.  Patient reports photophobia.  She has not taken anything for her symptoms.  No known precipitating events.  No alleviating factors. Pressure 8/10.    Social hx  Nonsmoker  Occasional alcohol    The history is provided by the patient.        Past Medical History:   Diagnosis Date   ??? Elevated cholesterol    ??? Hypertension    ??? MI (myocardial infarction) ([place])    ??? Psychiatric disorder     anxiety, panic attacks       Past Surgical History:   Procedure Laterality Date   ??? CARDIAC SURG PROCEDURE UNLIST      w/o stents   ??? HX TUBAL LIGATION           History reviewed. No pertinent family history.    Social History     Socioeconomic History   ??? Marital status: SINGLE     Spouse name: Not on file   ??? Number of children: Not on file   ??? Years of education: Not on file   ??? Highest education level: Not on file   Occupational History   ??? Not on file   Social Needs   ??? Financial resource strain: Not on file   ??? Food insecurity:     Worry: Not on file     Inability: Not on file   ??? Transportation needs:     Medical: Not on file     Non-medical: Not on file   Tobacco Use   ??? Smoking status: Former Smoker   ??? Smokeless tobacco: Never Used   Substance and Sexual Activity   ??? Alcohol use: Yes     Comment: Seldom   ??? Drug use: No   ??? Sexual activity: Not on file   Lifestyle   ??? Physical activity:      Days per week: Not on file     Minutes per session: Not on file   ??? Stress: Not on file   Relationships   ??? Social connections:     Talks on phone: Not on file     Gets together: Not on file     Attends religious service: Not on file     Active member of club or organization: Not on file     Attends meetings of clubs or organizations: Not on file     Relationship status: Not on file   ??? Intimate partner violence:     Fear of current or ex partner: Not on file     Emotionally abused: Not on file     Physically abused: Not on file     Forced sexual activity: Not on file   Other Topics Concern   ??? Not on file   Social History Narrative   ??? Not on file  ALLERGIES: Contrast agent [iodine]; Darvocet a500 [propoxyphene n-acetaminophen]; Norvasc [amlodipine]; and Vicodin [hydrocodone-acetaminophen]    Review of Systems   Constitutional: Positive for fatigue. Negative for chills and fever.   HENT: Positive for sore throat.    Eyes: Positive for photophobia.   Respiratory: Negative for cough, chest tightness and shortness of breath.    Gastrointestinal: Positive for nausea. Negative for abdominal pain.   Genitourinary: Negative for difficulty urinating and dysuria.   Musculoskeletal: Negative for back pain, myalgias, neck pain and neck stiffness.   Skin: Negative for color change and rash.   Neurological: Positive for headaches. Negative for dizziness and light-headedness.       Vitals:    02/05/18 1441   BP: 133/87   Pulse: 96   Resp: 18   Temp: 98.3 ??F (36.8 ??C)   SpO2: 98%   Weight: 77.1 kg (170 lb)   Height: 4\' 11"  (1.499 m)            Physical Exam     Constitutional: Well-developed and well-nourished. No distress.   HENT:   Right Ear: Tympanic membrane normal. No tragal or auricle tenderness.  Left Ear: Tympanic membrane normal.  No tragal or auricle tenderness bilaterally.  No drainage.   Nose: No nasal discharge.   Mouth/Throat: Mucous membranes are moist. No dental caries. No tonsillar  exudate. Oropharynx is clear. Pharynx is normal.   Uvula midline, no trismus, drooling, submandibular swelling. Tonsils 2+ bilaterally. No exudates.  Tolerating secretions without problem.  No mastoid tenderness.   Eyes: Conjunctivae are normal. Pupils are equal, round, and reactive to light. Right eye exhibits no discharge. Left eye exhibits no discharge.   Neck: Normal range of motion. Neck supple. No neck rigidity. NO MENINGEAL SIGNS  Cardiovascular: Normal rate and regular rhythm.   Pulmonary/Chest: Effort normal and breath sounds normal. No stridor. No respiratory distress.  no wheezes.  no rales.   Good air movement bilaterally   Abdominal: Soft. There is no hepatosplenomegaly. There is no rebound and no guarding. No pain with palpation.  Musculoskeletal: Normal range of motion. no signs of injury.   Lymphadenopathy: No cervical adenopathy.   Neurological:  alert.  normal muscle tone. Coordination normal.   Skin: Skin is warm and dry. No petechiae, no purpura and no rash noted.   Nursing note and vitals reviewed.    mdm  48 year old female presenting for not feeling well, headache, nausea.  She is afebrile.  Nontoxic-appearing.  No meningeal signs.  Abdomen is soft and nontender.  Lungs are clear.  No tachycardia tachypnea or hypoxia.  No hypertension.  He is alert and oriented.  No meningeal signs.  Plan: Ct, labs, iv fluid.    8:47 PM  Progress Note:   8:47 PM   Pt has been reexamined by Lafe Garin, PA-C. Pt is feeling much better. Symptoms have improved.headache is gone. Patient is well hydrated, well appearing, and in no respiratory distress. Physical exam is reassuring, and without signs of serious illness.   All available results have been reviewed with pt and any available family. Pt understands sx, dx, and tx in ED. Will discharge patient home with supportive care, and follow-up with PCP within the next few days.    Pt case including HPI, PE, and all available lab and radiology results has  been discussed with attending physician. Opportunity to evaluate patient has been provided to ER attending.  Discharge and prescription plan has been agreed upon.

## 2018-02-07 LAB — CULTURE, THROAT
Culture result:: NORMAL
Culture: NORMAL

## 2019-03-04 LAB — HM MAMMOGRAPHY

## 2019-03-16 LAB — HM PAP SMEAR

## 2019-03-16 LAB — RESULTS CONSOLE HPV: CHL HPV: NEGATIVE

## 2020-11-13 ENCOUNTER — Emergency Department (HOSPITAL_COMMUNITY): Payer: Self-pay

## 2020-11-13 ENCOUNTER — Other Ambulatory Visit: Payer: Self-pay

## 2020-11-13 ENCOUNTER — Emergency Department (HOSPITAL_COMMUNITY)
Admission: EM | Admit: 2020-11-13 | Discharge: 2020-11-14 | Disposition: A | Discharge status: Home / Self Care | Payer: Self-pay | Attending: Emergency Medicine | Admitting: Emergency Medicine

## 2020-11-13 ENCOUNTER — Encounter (HOSPITAL_COMMUNITY): Payer: Self-pay

## 2020-11-13 DIAGNOSIS — Z5321 Procedure and treatment not carried out due to patient leaving prior to being seen by health care provider: Secondary | ICD-10-CM | POA: Insufficient documentation

## 2020-11-13 DIAGNOSIS — M546 Pain in thoracic spine: Secondary | ICD-10-CM | POA: Insufficient documentation

## 2020-11-13 DIAGNOSIS — R0789 Other chest pain: Secondary | ICD-10-CM | POA: Insufficient documentation

## 2020-11-13 HISTORY — DX: Acute myocardial infarction, unspecified: I21.9

## 2020-11-13 LAB — CBC
HCT: 33.9 % — ABNORMAL LOW (ref 36.0–46.0)
Hemoglobin: 11 g/dL — ABNORMAL LOW (ref 12.0–15.0)
MCH: 25.6 pg — ABNORMAL LOW (ref 26.0–34.0)
MCHC: 32.4 g/dL (ref 30.0–36.0)
MCV: 79 fL — ABNORMAL LOW (ref 80.0–100.0)
Platelets: 338 10*3/uL (ref 150–400)
RBC: 4.29 MIL/uL (ref 3.87–5.11)
RDW: 17 % — ABNORMAL HIGH (ref 11.5–15.5)
WBC: 6.6 10*3/uL (ref 4.0–10.5)
nRBC: 0 % (ref 0.0–0.2)

## 2020-11-13 LAB — COMPREHENSIVE METABOLIC PANEL
ALT: 20 U/L (ref 0–44)
AST: 17 U/L (ref 15–41)
Albumin: 4 g/dL (ref 3.5–5.0)
Alkaline Phosphatase: 79 U/L (ref 38–126)
Anion gap: 7 (ref 5–15)
BUN: 11 mg/dL (ref 6–20)
CO2: 26 mmol/L (ref 22–32)
Calcium: 8.9 mg/dL (ref 8.9–10.3)
Chloride: 103 mmol/L (ref 98–111)
Creatinine, Ser: 0.51 mg/dL (ref 0.44–1.00)
GFR, Estimated: >60 mL/min (ref >60)
Glucose, Bld: 90 mg/dL (ref 70–99)
Potassium: 3.4 mmol/L — ABNORMAL LOW (ref 3.5–5.1)
Sodium: 136 mmol/L (ref 135–145)
Total Bilirubin: 0.5 mg/dL (ref 0.3–1.2)
Total Protein: 7.4 g/dL (ref 6.5–8.1)

## 2020-11-13 LAB — TROPONIN I (HIGH SENSITIVITY): Troponin I (High Sensitivity): <2 ng/L (ref <18)

## 2020-11-13 LAB — PREGNANCY, URINE: Preg Test, Ur: NEGATIVE

## 2020-11-13 LAB — LIPASE, BLOOD: Lipase: 27 U/L (ref 11–51)

## 2020-11-13 NOTE — ED Provider Notes (Signed)
Emergency Medicine Provider Triage Evaluation Note  BRYTTANY TORTORELLI , a 50 y.o. female  was evaluated in triage.  Pt complains of right upper back pain and central chest pressure.  Patient admits to having central chest pressure previously and given GI cocktail with improvement in symptoms.  Patient has had a previous MI.  Patient notes symptoms do not feel similar.  No associated shortness of breath, nausea, or vomiting.  No history of blood clots, recent surgeries, recent long immobilizations, and hormonal treatments.  No lower extremity edema.  Review of Systems  Positive: Chest pain, back pain Negative: fever  Physical Exam  BP (!) 159/94 (BP Location: Right Arm)   Pulse 85   Temp 99.2 F (37.3 C) (Oral)   Resp 18   SpO2 98%  Gen:   Awake, no distress   Resp:  Normal effort  MSK:   Moves extremities without difficulty  Other:    Medical Decision Making  Medically screening exam initiated at 4:46 PM.  Appropriate orders placed.  Chalsey L Finfrock was informed that the remainder of the evaluation will be completed by another provider, this initial triage assessment does not replace that evaluation, and the importance of remaining in the ED until their evaluation is complete.  Cardiac and abdominal labs   Jesusita Oka 11/13/20 1648    Eber Hong, MD 11/13/20 2033

## 2020-11-13 NOTE — ED Triage Notes (Signed)
Pt reports left sided upper back for a few days. She also endorses mid chest fullness and indigestion over the past few days. Pt states she had a similar sensation in her chest before and it was relieved by a GI cocktail. Denies SHOB.

## 2020-12-06 ENCOUNTER — Emergency Department (HOSPITAL_COMMUNITY)
Admission: EM | Admit: 2020-12-06 | Discharge: 2020-12-07 | Disposition: A | Discharge status: Home / Self Care | Payer: Self-pay | Attending: Emergency Medicine | Admitting: Emergency Medicine

## 2020-12-06 DIAGNOSIS — R0789 Other chest pain: Secondary | ICD-10-CM | POA: Insufficient documentation

## 2020-12-06 DIAGNOSIS — R1013 Epigastric pain: Secondary | ICD-10-CM | POA: Insufficient documentation

## 2020-12-06 DIAGNOSIS — Z20822 Contact with and (suspected) exposure to covid-19: Secondary | ICD-10-CM | POA: Insufficient documentation

## 2020-12-06 DIAGNOSIS — R0602 Shortness of breath: Secondary | ICD-10-CM | POA: Insufficient documentation

## 2020-12-06 DIAGNOSIS — R079 Chest pain, unspecified: Secondary | ICD-10-CM

## 2020-12-06 LAB — CBC WITH DIFFERENTIAL/PLATELET
Abs Immature Granulocytes: 0.02 10*3/uL (ref 0.00–0.07)
Basophils Absolute: 0 10*3/uL (ref 0.0–0.1)
Basophils Relative: 0 %
Eosinophils Absolute: 0.2 10*3/uL (ref 0.0–0.5)
Eosinophils Relative: 2 %
HCT: 35.6 % — ABNORMAL LOW (ref 36.0–46.0)
Hemoglobin: 11 g/dL — ABNORMAL LOW (ref 12.0–15.0)
Immature Granulocytes: 0 %
Lymphocytes Relative: 27 %
Lymphs Abs: 2.2 10*3/uL (ref 0.7–4.0)
MCH: 24.5 pg — ABNORMAL LOW (ref 26.0–34.0)
MCHC: 30.9 g/dL (ref 30.0–36.0)
MCV: 79.3 fL — ABNORMAL LOW (ref 80.0–100.0)
Monocytes Absolute: 0.8 10*3/uL (ref 0.1–1.0)
Monocytes Relative: 9 %
Neutro Abs: 5 10*3/uL (ref 1.7–7.7)
Neutrophils Relative %: 62 %
Platelets: 385 10*3/uL (ref 150–400)
RBC: 4.49 MIL/uL (ref 3.87–5.11)
RDW: 16 % — ABNORMAL HIGH (ref 11.5–15.5)
WBC: 8.2 10*3/uL (ref 4.0–10.5)
nRBC: 0 % (ref 0.0–0.2)

## 2020-12-06 LAB — COMPREHENSIVE METABOLIC PANEL
ALT: 17 U/L (ref 0–44)
AST: 19 U/L (ref 15–41)
Albumin: 3.8 g/dL (ref 3.5–5.0)
Alkaline Phosphatase: 82 U/L (ref 38–126)
Anion gap: 10 (ref 5–15)
BUN: 10 mg/dL (ref 6–20)
CO2: 27 mmol/L (ref 22–32)
Calcium: 9.2 mg/dL (ref 8.9–10.3)
Chloride: 98 mmol/L (ref 98–111)
Creatinine, Ser: 0.66 mg/dL (ref 0.44–1.00)
GFR, Estimated: >60 mL/min (ref >60)
Glucose, Bld: 94 mg/dL (ref 70–99)
Potassium: 3.4 mmol/L — ABNORMAL LOW (ref 3.5–5.1)
Sodium: 135 mmol/L (ref 135–145)
Total Bilirubin: 0.5 mg/dL (ref 0.3–1.2)
Total Protein: 7.1 g/dL (ref 6.5–8.1)

## 2020-12-06 LAB — ETHANOL: Alcohol, Ethyl (B): <10 mg/dL (ref <10)

## 2020-12-06 LAB — LIPASE, BLOOD: Lipase: 30 U/L (ref 11–51)

## 2020-12-06 LAB — TROPONIN I (HIGH SENSITIVITY): Troponin I (High Sensitivity): 3 ng/L (ref <18)

## 2020-12-06 LAB — RESP PANEL BY RT-PCR (FLU A&B, COVID) ARPGX2
Influenza A by PCR: NEGATIVE
Influenza B by PCR: NEGATIVE
SARS Coronavirus 2 by RT PCR: NEGATIVE

## 2020-12-06 MED ORDER — SUCRALFATE 1 GM/10ML PO SUSP
1.0000 g | Freq: Once | ORAL | Status: DC
  Filled 2020-12-06: qty 10

## 2020-12-06 NOTE — ED Triage Notes (Signed)
Pt via [facility], episodes of SHOB since last night, intermittent associated chest tightness.  No meds PTA, EKG unremarkable  BP 210 systolic, 178/94, hx of same.

## 2020-12-06 NOTE — ED Provider Notes (Signed)
Emergency Medicine Provider Triage Evaluation Note  Erin Pollard , a 50 y.o. female  was evaluated in triage.  Pt complains of chest pain, epigastric pain.  Onset was yesterday, though she had a similar episode 2 weeks ago.  Since yesterday she has had pain in the sternal and epigastric region, with slight radiation bilaterally anteriorly.  There is associated fatigue with exertion, though not necessarily exertional pain.  Patient does not smoke, no recent travel, no active cancer.  She is retired, takes care of of a grand daughter.  2 weeks ago after similar process she went for evaluation but left before being seen at another hospital.  Review of Systems  Positive: As above Negative: No syncope, no vomiting, no fever  Physical Exam  BP (!) 148/87 (BP Location: Left Arm)   Pulse 74   Temp 98.5 F (36.9 C) (Oral)   Resp 20   SpO2 98%  Gen:   Awake, no distress speaking clearly Resp:  Normal effort no increased work of breathing MSK:   Moves extremities without difficulty no deformities Other:  Psych: Anxious  Medical Decision Making  Medically screening exam initiated at 9:22 PM.  Appropriate orders placed.  Mayerli L Rapley was informed that the remainder of the evaluation will be completed by another provider, this initial triage assessment does not replace that evaluation, and the importance of remaining in the ED until their evaluation is complete.   Gerhard Munch, MD 12/06/20 2124

## 2020-12-07 LAB — TROPONIN I (HIGH SENSITIVITY): Troponin I (High Sensitivity): 2 ng/L (ref <18)

## 2020-12-07 MED ORDER — LOSARTAN POTASSIUM 50 MG PO TABS: 50.0000 mg | ORAL_TABLET | Freq: Every day | ORAL | 1 refills | Status: DC

## 2020-12-07 NOTE — Discharge Instructions (Signed)
You were evaluated in the Emergency Department and after careful evaluation, we did not find any emergent condition requiring admission or further testing in the hospital.  Your exam/testing today was overall reassuring.  Recommend follow up with a primary care doctor as well as a cardiologist regarding her symptoms.  Please return to the Emergency Department if you experience any worsening of your condition.  Thank you for allowing [place] to be a part of your care.

## 2020-12-07 NOTE — ED Provider Notes (Signed)
[facility] DEPT [facility] Emergency Department Provider Note MRN:  [id number]  Arrival date & time: 12/07/20     Chief Complaint   Shortness of Breath   History of Present Illness   Erin Pollard is a 50 y.o. year-old female with a history of MI presenting to the ED with chief complaint of shortness of breath.  Intermittent shortness of breath over the past few days.  Also experiencing some intermittent chest pressure.  Seems to occur when anxious or stressed.  Recent loss of a loved 1 last week.  Denies nausea or vomiting, no dizziness or diaphoresis, no abdominal pain, no numbness or weakness to the arms or legs.  Also has occasional epigastric pain which she attributes to her gastritis.  Review of Systems  A complete 10 system review of systems was obtained and all systems are negative except as noted in the HPI and PMH.   Patient's Health History    Past Medical History:  Diagnosis Date   Myocardial infarction ([facility])     No past surgical history on file.  No family history on file.  Social History   Socioeconomic History   Marital status: Married    Spouse name: Not on file   Number of children: Not on file   Years of education: Not on file   Highest education level: Not on file  Occupational History   Not on file  Tobacco Use   Smoking status: Not on file   Smokeless tobacco: Not on file  Substance and Sexual Activity   Alcohol use: Not on file   Drug use: Not on file   Sexual activity: Not on file  Other Topics Concern   Not on file  Social History Narrative   Not on file   Social Determinants of Health   Financial Resource Strain: Not on file  Food Insecurity: Not on file  Transportation Needs: Not on file  Physical Activity: Not on file  Stress: Not on file  Social Connections: Not on file  Intimate Partner Violence: Not on file     Physical Exam   Vitals:   12/07/20 0556 12/07/20 0600  BP: (!) 149/77 (!) 142/82  Pulse: 75 78  Resp:  15 20  Temp: 98.4 F (36.9 C)   SpO2: 99% 99%    CONSTITUTIONAL: Well-appearing, NAD NEURO:  Alert and oriented x 3, no focal deficits EYES:  eyes equal and reactive ENT/NECK:  no LAD, no JVD CARDIO: Regular rate, well-perfused, normal S1 and S2 PULM:  CTAB no wheezing or rhonchi GI/GU:  normal bowel sounds, non-distended, non-tender MSK/SPINE:  No gross deformities, no edema SKIN:  no rash, atraumatic PSYCH:  Appropriate speech and behavior  *Additional and/or pertinent findings included in MDM below  Diagnostic and Interventional Summary    EKG Interpretation  Date/Time:  Thursday December 07 2020 05:56:45 EST Ventricular Rate:  72 PR Interval:  140 QRS Duration: 81 QT Interval:  405 QTC Calculation: 444 R Axis:   13 Text Interpretation: Sinus rhythm Borderline T abnormalities, anterior leads Confirmed by Kennis Carina [phone]) on 12/07/2020 6:02:48 AM       Labs Reviewed  COMPREHENSIVE METABOLIC PANEL - Abnormal; Notable for the following components:      Result Value   Potassium 3.4 (*)    All other components within normal limits  CBC WITH DIFFERENTIAL/PLATELET - Abnormal; Notable for the following components:   Hemoglobin 11.0 (*)    HCT 35.6 (*)    MCV 79.3 (*)  MCH 24.5 (*)    RDW 16.0 (*)    All other components within normal limits  RESP PANEL BY RT-PCR (FLU A&B, COVID) ARPGX2  ETHANOL  LIPASE, BLOOD  TROPONIN I (HIGH SENSITIVITY)  TROPONIN I (HIGH SENSITIVITY)    No orders to display    Medications  sucralfate (CARAFATE) 1 GM/10ML suspension 1 g (has no administration in time range)     Procedures  /  Critical Care Procedures  ED Course and Medical Decision Making  I have reviewed the triage vital signs, the nursing notes, and pertinent available records from the EMR.  Listed above are laboratory and imaging tests that I personally ordered, reviewed, and interpreted and then considered in my medical decision making (see below for  details).  Favoring noncardiac causes of chest/epigastric pain, such as GI or anxiety.  EKG is normal, troponin is negative x2.  Vital signs are normal.  Nothing to suggest PE or other emergent process, patient is appropriate for discharge.       Elmer Sow. Pilar Plate, MD [facility] Emergency Medicine [facility] [email or web address]  Final Clinical Impressions(s) / ED Diagnoses     ICD-10-CM   1. Chest pain, unspecified type  R07.9       ED Discharge Orders          Ordered    losartan (COZAAR) 50 MG tablet  Daily        12/07/20 0620             Discharge Instructions Discussed with and Provided to Patient:    Discharge Instructions      You were evaluated in the Emergency Department and after careful evaluation, we did not find any emergent condition requiring admission or further testing in the hospital.  Your exam/testing today was overall reassuring.  Recommend follow up with a primary care doctor as well as a cardiologist regarding her symptoms.  Please return to the Emergency Department if you experience any worsening of your condition.  Thank you for allowing [place] to be a part of your care.        Sabas Sous, MD 12/07/20 [phone]

## 2020-12-21 ENCOUNTER — Ambulatory Visit: Payer: Self-pay | Admitting: *Deleted

## 2020-12-22 NOTE — Telephone Encounter (Signed)
Patient called, no answer, recording subscriber is not receiving calls right now, try again later. Unable to reach patient after 3 attempts by [facility] NT, routing to the provider for resolution per protocol.   Summary: Clinical Advise   Patient calling in requesting Hydralazine 25mg  3 x day for blood pressure. She was seen at [facility] on 11/16 Patient has a hospital follow up appointment on 12/22 with [facility] and also is on the wait list. Patient says she'll run out of medication on 12/15 seeking a short supply to hold her over until appointment.        [facility]  [address] [place]  Phone number [phone]

## 2020-12-22 NOTE — Telephone Encounter (Signed)
Patient calling in requesting Hydralazine 25mg  3 x day for blood pressure. She was seen at [facility] on 11/16 Patient has a hospital follow up appointment on 12/22 with [facility] and also is on the wait list. Patient says she'll run out of medication on 12/15 seeking a short supply to hold her over until appointment.       Called patient to review medication request . No answer, left message of voicemail to call back #[phone].

## 2020-12-22 NOTE — Telephone Encounter (Signed)
Sent to PCP ?

## 2020-12-22 NOTE — Telephone Encounter (Signed)
Please see previous telephone encounter. Informed patient we may need to schedule an earlier appointment than 01/11/21 in order to provider her medication. She is available anytime after 2:30p as that is when her daughter can drive her to the appointment. Provided B/P readings for the last 4 days 131/82 HR 77, 130/84 HR 75, 139/82 HR 85 and 122/83 HR 84. Previous provider is over 2 hours away,  Dr. Rivka Barbara Pullium @ [facility] [phone] Routing to provider for review and possible earlier appointment than scheduled.

## 2020-12-25 ENCOUNTER — Encounter (INDEPENDENT_AMBULATORY_CARE_PROVIDER_SITE_OTHER): Payer: Self-pay | Admitting: Primary Care

## 2020-12-25 ENCOUNTER — Telehealth (INDEPENDENT_AMBULATORY_CARE_PROVIDER_SITE_OTHER): Payer: Self-pay | Admitting: Primary Care

## 2020-12-25 ENCOUNTER — Other Ambulatory Visit: Payer: Self-pay

## 2020-12-25 DIAGNOSIS — D509 Iron deficiency anemia, unspecified: Secondary | ICD-10-CM

## 2020-12-25 DIAGNOSIS — I509 Heart failure, unspecified: Secondary | ICD-10-CM

## 2020-12-25 DIAGNOSIS — Z760 Encounter for issue of repeat prescription: Secondary | ICD-10-CM

## 2020-12-25 DIAGNOSIS — E876 Hypokalemia: Secondary | ICD-10-CM

## 2020-12-25 DIAGNOSIS — Z7689 Persons encountering health services in other specified circumstances: Secondary | ICD-10-CM

## 2020-12-25 DIAGNOSIS — I10 Essential (primary) hypertension: Secondary | ICD-10-CM

## 2020-12-25 MED ORDER — LOSARTAN POTASSIUM 50 MG PO TABS: 50.0000 mg | ORAL_TABLET | Freq: Every day | ORAL | 1 refills | Status: DC

## 2020-12-25 MED ORDER — LABETALOL HCL 200 MG PO TABS: 200.0000 mg | ORAL_TABLET | Freq: Every day | ORAL | 1 refills | Status: DC

## 2020-12-25 MED ORDER — POTASSIUM CHLORIDE CRYS ER 10 MEQ PO TBCR
10.0000 meq | EXTENDED_RELEASE_TABLET | Freq: Every day | ORAL | 1 refills | Status: DC
Start: 2020-12-25 — End: 2021-01-17

## 2020-12-25 MED ORDER — HYDROCHLOROTHIAZIDE 12.5 MG PO TABS: 12.5000 mg | ORAL_TABLET | Freq: Every day | ORAL | 1 refills | Status: DC

## 2020-12-25 MED ORDER — OMEPRAZOLE 40 MG PO CPDR
40.0000 mg | DELAYED_RELEASE_CAPSULE | Freq: Every day | ORAL | 1 refills | Status: DC
Start: 2020-12-25 — End: 2021-04-20

## 2020-12-25 MED ORDER — IRON (FERROUS SULFATE) 325 (65 FE) MG PO TABS: 325.0000 mg | ORAL_TABLET | Freq: Every day | ORAL | 1 refills | Status: DC

## 2020-12-25 MED ORDER — HYDRALAZINE HCL 25 MG PO TABS: 25.0000 mg | ORAL_TABLET | Freq: Three times a day (TID) | ORAL | 1 refills | Status: DC

## 2020-12-25 NOTE — Telephone Encounter (Signed)
Patient scheduled and completed mychart visit today with PCP.

## 2020-12-25 NOTE — Progress Notes (Signed)
[facility]  Virtual Visit  I connected with Erin Pollard, on 12/25/2020 at 10:17 AM  video application or by and verified that I am speaking with the correct person using two identifiers.  Consent: I discussed the limitations, risks, security and privacy concerns of performing an evaluation and management service by telephone and the availability of in person appointments. I also discussed with the patient that there may be a patient responsible charge related to this service. The patient expressed understanding and agreed to proceed.     Location of Patient: Home   Location of Provider: [facility]   Persons participating in Telemedicine visit: Erin Pollard Erin Lemming,  NP Cristela Felt , CMA  History of Present Illness:  Ms.Erin Pollard is a 50 y.o. year-old female with a history of MI presenting to the ED with chief complaint of shortness of breath.   Intermittent shortness of breath over the past few days.  Also experiencing some intermittent chest pressure.  Seems to occur when anxious or stressed.  Recent loss of a loved 1 last week.  Denies nausea or vomiting, no dizziness or diaphoresis, no abdominal pain, no numbness or weakness to the arms or legs.  Also has occasional epigastric pain which she attributes to her gastritis. Past Medical History:  Diagnosis Date   Myocardial infarction (HCC)    Allergies  Allergen Reactions   Amlodipine Hives    Achy legs and s.o.b Achy legs and s.o.b    Hydrocodone-Acetaminophen Hives and Rash   Iodinated Diagnostic Agents Hives   Iodine Hives and Rash   Hydrocortisone-Iodoquinol    Propoxyphene     Current Outpatient Medications on File Prior to Visit  Medication Sig Dispense Refill   clonazePAM (KLONOPIN) 0.5 MG tablet Take 0.5 mg by mouth 3 (three) times daily.     hydrALAZINE (APRESOLINE) 25 MG tablet Take 25 mg by mouth 3 (three) times  daily.     hydrochlorothiazide (HYDRODIURIL) 12.5 MG tablet Take 1 tablet by mouth daily.     labetalol (NORMODYNE) 200 MG tablet Take 200 mg by mouth daily.     losartan (COZAAR) 50 MG tablet Take 1 tablet (50 mg total) by mouth daily. 30 tablet 1   mirtazapine (REMERON) 15 MG tablet Take 15 mg by mouth at bedtime.     omeprazole (PRILOSEC) 40 MG capsule Take 1 tablet by mouth daily.     ondansetron (ZOFRAN-ODT) 4 MG disintegrating tablet Take 4 mg by mouth as needed.     No current facility-administered medications on file prior to visit.    Observations/Objective: SYSTOLIC  122-153  AND DIASTOLIC 80-86 PULSE 72-78  Assessment and Plan: Erin Pollard was seen today for hospitalization follow-up.  Diagnoses and all orders for this visit:  Encounter to establish care Establish care for PCP   Essential hypertension Counseled on blood pressure goal of less than 130/80, low-sodium, DASH diet, medication compliance, 150 minutes of moderate intensity exercise per week. Discussed medication compliance, adverse effects.   Acute congestive heart failure, unspecified heart failure type (HCC) -     Ambulatory referral to Cardiology  Medication refill -     hydrochlorothiazide (HYDRODIURIL) 12.5 MG tablet; Take 1 tablet (12.5 mg total) by mouth daily. -     hydrALAZINE (APRESOLINE) 25 MG tablet; Take 1 tablet (25 mg total) by mouth 3 (three) times daily. -     labetalol (NORMODYNE) 200 MG tablet; Take 1 tablet (200 mg  total) by mouth daily.  Acute congestive heart failure, unspecified heart failure type (HCC) -     Ambulatory referral to Cardiology  Hypokalemia -     potassium chloride SA (KLOR-CON M) 10 MEQ tablet; Take 1 tablet (10 mEq total) by mouth daily.  Iron deficiency anemia, unspecified iron deficiency anemia type -     Iron, Ferrous Sulfate, 325 (65 Fe) MG TABS; Take 325 mg by mouth daily.  Other orders -     losartan (COZAAR) 50 MG tablet; Take 1 tablet (50 mg total) by mouth  daily. -     omeprazole (PRILOSEC) 40 MG capsule; Take 1 capsule (40 mg total) by mouth daily.     Follow Up Instructions: Keep schedule appt   I discussed the assessment and treatment plan with the patient. The patient was provided an opportunity to ask questions and all were answered. The patient agreed with the plan and demonstrated an understanding of the instructions.   The patient was advised to call back or seek an in-person evaluation if the symptoms worsen or if the condition fails to improve as anticipated.     I provided 70 minutes total of non-face-to-face time during this encounter including median intraservice time, reviewing previous notes, investigations, ordering medications, medical decision making, coordinating care and patient verbalized understanding at the end of the visit.    This note has been created with Education officer, environmental. Any transcriptional errors are unintentional.   Erin Sessions, NP 12/25/2020, 10:17 AM

## 2020-12-26 ENCOUNTER — Ambulatory Visit (INDEPENDENT_AMBULATORY_CARE_PROVIDER_SITE_OTHER): Payer: Self-pay | Admitting: Primary Care

## 2021-01-11 ENCOUNTER — Ambulatory Visit (INDEPENDENT_AMBULATORY_CARE_PROVIDER_SITE_OTHER): Payer: Self-pay | Admitting: Primary Care

## 2021-01-11 ENCOUNTER — Other Ambulatory Visit: Payer: Self-pay

## 2021-01-11 ENCOUNTER — Encounter (INDEPENDENT_AMBULATORY_CARE_PROVIDER_SITE_OTHER): Payer: Self-pay | Admitting: Primary Care

## 2021-01-11 ENCOUNTER — Inpatient Hospital Stay (INDEPENDENT_AMBULATORY_CARE_PROVIDER_SITE_OTHER): Payer: Self-pay | Admitting: Primary Care

## 2021-01-11 VITALS — BP 144/86 | HR 78 | Temp 98.6°F | Ht 59.0 in | Wt 176.4 lb

## 2021-01-11 DIAGNOSIS — Z131 Encounter for screening for diabetes mellitus: Secondary | ICD-10-CM

## 2021-01-11 DIAGNOSIS — I10 Essential (primary) hypertension: Secondary | ICD-10-CM

## 2021-01-11 DIAGNOSIS — Z6835 Body mass index (BMI) 35.0-35.9, adult: Secondary | ICD-10-CM

## 2021-01-11 LAB — POCT GLYCOSYLATED HEMOGLOBIN (HGB A1C): Hemoglobin A1C: 5.5 % (ref 4.0–5.6)

## 2021-01-11 MED ORDER — HYDROCHLOROTHIAZIDE 12.5 MG PO TABS: 25.0000 mg | ORAL_TABLET | Freq: Every day | ORAL | 1 refills | Status: DC

## 2021-01-11 NOTE — Patient Instructions (Signed)
Anemia °Anemia is a condition in which there is not enough red blood cells or hemoglobin in the blood. Hemoglobin is a substance in red blood cells that carries oxygen. °When you do not have enough red blood cells or hemoglobin (are anemic), your body cannot get enough oxygen and your organs may not work properly. As a result, you may feel very tired or have other problems. °What are the causes? °Common causes of anemia include: °Excessive bleeding. Anemia can be caused by excessive bleeding inside or outside the body, including bleeding from the intestines or from heavy menstrual periods in females. °Poor nutrition. °Long-lasting (chronic) kidney, thyroid, and liver disease. °Bone marrow disorders, spleen problems, and blood disorders. °Cancer and treatments for cancer. °HIV (human immunodeficiency virus) and AIDS (acquired immunodeficiency syndrome). °Infections, medicines, and autoimmune disorders that destroy red blood cells. °What are the signs or symptoms? °Symptoms of this condition include: °Minor weakness. °Dizziness. °Headache, or difficulties concentrating and sleeping. °Heartbeats that feel irregular or faster than normal (palpitations). °Shortness of breath, especially with exercise. °Pale skin, lips, and nails, or cold hands and feet. °Indigestion and nausea. °Symptoms may occur suddenly or develop slowly. If your anemia is mild, you may not have symptoms. °How is this diagnosed? °This condition is diagnosed based on blood tests, your medical history, and a physical exam. In some cases, a test may be needed in which cells are removed from the soft tissue inside of a bone and looked at under a microscope (bone marrow biopsy). Your health care provider may also check your stool (feces) for blood and may do additional testing to look for the cause of your bleeding. °Other tests may include: °Imaging tests, such as a CT scan or MRI. °A procedure to see inside your esophagus and stomach (endoscopy). °A  procedure to see inside your colon and rectum (colonoscopy). °How is this treated? °Treatment for this condition depends on the cause. If you continue to lose a lot of blood, you may need to be treated at a hospital. Treatment may include: °Taking supplements of iron, vitamin B12, or folic acid. °Taking a hormone medicine (erythropoietin) that can help to stimulate red blood cell growth. °Having a blood transfusion. This may be needed if you lose a lot of blood. °Making changes to your diet. °Having surgery to remove your spleen. °Follow these instructions at home: °Take over-the-counter and prescription medicines only as told by your health care provider. °Take supplements only as told by your health care provider. °Follow any diet instructions that you were given by your health care provider. °Keep all follow-up visits as told by your health care provider. This is important. °Contact a health care provider if: °You develop new bleeding anywhere in the body. °Get help right away if: °You are very weak. °You are short of breath. °You have pain in your abdomen or chest. °You are dizzy or feel faint. °You have trouble concentrating. °You have bloody stools, black stools, or tarry stools. °You vomit repeatedly or you vomit up blood. °These symptoms may represent a serious problem that is an emergency. Do not wait to see if the symptoms will go away. Get medical help right away. Call your local emergency services (911 in the [place] Do not drive yourself to the hospital. °Summary °Anemia is a condition in which you do not have enough red blood cells or enough of a substance in your red blood cells that carries oxygen (hemoglobin). °Symptoms may occur suddenly or develop slowly. °If your anemia is   mild, you may not have symptoms. °This condition is diagnosed with blood tests, a medical history, and a physical exam. Other tests may be needed. °Treatment for this condition depends on the cause of the anemia. °This  information is not intended to replace advice given to you by your health care provider. Make sure you discuss any questions you have with your health care provider. °Document Revised: 12/15/2018 Document Reviewed: 12/15/2018 °Elsevier Patient Education © 2022 [employer]. ° °

## 2021-01-12 NOTE — Progress Notes (Signed)
[facility]   Erin Pollard is a 50 y.o. female presents for hypertension evaluation, Denies shortness of breath, headaches, chest pain or lower extremity edema, sudden onset, vision changes, unilateral weakness, dizziness, paresthesias   Patient is not on  medications.  Dietary habits include: monitoring intake  Exercise habits include:yes  Family / Social history: None   Past Medical History:  Diagnosis Date   Myocardial infarction [facility])    History reviewed. No pertinent surgical history. Allergies  Allergen Reactions   Amlodipine Hives    Achy legs and s.o.b Achy legs and s.o.b    Hydrocodone-Acetaminophen Hives and Rash   Iodinated Contrast Media Hives   Iodine Hives and Rash   Hydrocortisone-Iodoquinol    Propoxyphene    Current Outpatient Medications on File Prior to Visit  Medication Sig Dispense Refill   clonazePAM (KLONOPIN) 0.5 MG tablet Take 0.5 mg by mouth 3 (three) times daily.     hydrALAZINE (APRESOLINE) 25 MG tablet Take 1 tablet (25 mg total) by mouth 3 (three) times daily. 270 tablet 1   Iron, Ferrous Sulfate, 325 (65 Fe) MG TABS Take 325 mg by mouth daily. 90 tablet 1   labetalol (NORMODYNE) 200 MG tablet Take 1 tablet (200 mg total) by mouth daily. 90 tablet 1   losartan (COZAAR) 50 MG tablet Take 1 tablet (50 mg total) by mouth daily. 90 tablet 1   mirtazapine (REMERON) 15 MG tablet Take 15 mg by mouth at bedtime.     omeprazole (PRILOSEC) 40 MG capsule Take 1 capsule (40 mg total) by mouth daily. 30 capsule 1   ondansetron (ZOFRAN-ODT) 4 MG disintegrating tablet Take 4 mg by mouth as needed.     potassium chloride SA (KLOR-CON M) 10 MEQ tablet Take 1 tablet (10 mEq total) by mouth daily. 30 tablet 1   No current facility-administered medications on file prior to visit.   Social History   Socioeconomic History   Marital status: Married    Spouse name: Not on file   Number of children: Not on file   Years of education: Not on  file   Highest education level: Not on file  Occupational History   Not on file  Tobacco Use   Smoking status: Never   Smokeless tobacco: Not on file  Substance and Sexual Activity   Alcohol use: Not on file   Drug use: Never   Sexual activity: Yes  Other Topics Concern   Not on file  Social History Narrative   Not on file   Social Determinants of Health   Financial Resource Strain: Not on file  Food Insecurity: Not on file  Transportation Needs: Not on file  Physical Activity: Not on file  Stress: Not on file  Social Connections: Not on file  Intimate Partner Violence: Not on file   History reviewed. No pertinent family history.   OBJECTIVE:  Vitals:   01/11/21 1620  BP: (!) 144/86  Pulse: 78  Temp: 98.6 F (37 C)  TempSrc: Temporal  SpO2: 96%  Weight: 176 lb 6.4 oz (80 kg)  Height: 4\' 11"  (1.499 m)   Physical exam: General: Vital signs reviewed.  Patient is well-developed and well-nourished, obese female  in no acute distress and cooperative with exam. Head: Normocephalic and atraumatic. Eyes: EOMI, conjunctivae normal, no scleral icterus. Neck: Supple, trachea midline, normal ROM, no JVD, masses, thyromegaly, or carotid bruit present. Cardiovascular: RRR, S1 normal, S2 normal, no murmurs, gallops, or rubs. Pulmonary/Chest: Clear to auscultation bilaterally,  no wheezes, rales, or rhonchi. Abdominal: Soft, non-tender, non-distended, BS +, no masses, organomegaly, or guarding present. Musculoskeletal: No joint deformities, erythema, or stiffness, ROM full and nontender. Extremities: No lower extremity edema bilaterally,  pulses symmetric and intact bilaterally. No cyanosis or clubbing. Neurological: A&O x3, Strength is normal Skin: Warm, dry and intact. No rashes or erythema. Psychiatric: Normal mood and affect. speech and behavior is normal. Cognition and memory are normal.     ROS Pertinent positive and negative noted in HPI Last 3 Office BP readings: BP  Readings from Last 3 Encounters:  01/11/21 (!) 144/86  12/07/20 (!) 142/82  11/13/20 (!) 168/86    BMET    Component Value Date/Time   NA 135 12/06/2020 2138   K 3.4 (L) 12/06/2020 2138   CL 98 12/06/2020 2138   CO2 27 12/06/2020 2138   GLUCOSE 94 12/06/2020 2138   BUN 10 12/06/2020 2138   CREATININE 0.66 12/06/2020 2138   CALCIUM 9.2 12/06/2020 2138   GFRNONAA >60 12/06/2020 2138    Renal function: CrCl cannot be calculated (Patient's most recent lab result is older than the maximum 21 days allowed.).  Clinical ASCVD: No The ASCVD Risk score (Arnett DK, et al., 2019) failed to calculate for the following reasons:   Cannot find a previous HDL lab   Cannot find a previous total cholesterol lab  ASCVD risk factors include- [place]   ASSESSMENT & PLAN: Erin Pollard was seen today for hypertension.  Diagnoses and all orders for this visit:  Screening for diabetes mellitus -     HgB A1c 5.5 per ADA guidelines not a diabetic   Essential hypertension -Counseled on lifestyle modifications for blood pressure control including reduced dietary sodium, increased exercise, weight reduction and adequate sleep. Also, educated patient about the risk for cardiovascular events, stroke and heart attack. Also counseled patient about the importance of medication adherence. If you participate in smoking, it is important to stop using tobacco as this will increase the risks associated with uncontrolled blood pressure.   -Hypertension newly diagnosed currently not on current medications. Patient  will be started on HCTZ 12.5mg    adherent with current medications.   Goal BP:  For patients younger than 60: Goal BP < 130/80. For patients 60 and older: Goal BP < 140/90. For patients with diabetes: Goal BP < 130/80. Your most recent BP: 144/86  Minimize salt intake. Minimize alcohol intake  Class 2 severe obesity due to excess calories with serious comorbidity in adult, unspecified BMI (HCC) Obesity  is 30-39 indicating an excess in caloric intake or underlining conditions. This may lead to other co-morbidities. Lifestyle modifications of diet and exercise may reduce obesity.    Meds ordered this encounter  Medications   hydrochlorothiazide (HYDRODIURIL) 12.5 MG tablet    Sig: Take 2 tablets (25 mg total) by mouth daily.    Dispense:  90 tablet    Refill:  1    This note has been created with Education officer, environmental. Any transcriptional errors are unintentional.   Grayce Sessions, NP 01/12/2021, 12:46 PM

## 2021-01-15 ENCOUNTER — Other Ambulatory Visit (INDEPENDENT_AMBULATORY_CARE_PROVIDER_SITE_OTHER): Payer: Self-pay | Admitting: Primary Care

## 2021-01-17 ENCOUNTER — Other Ambulatory Visit (INDEPENDENT_AMBULATORY_CARE_PROVIDER_SITE_OTHER): Payer: Self-pay | Admitting: Primary Care

## 2021-01-17 DIAGNOSIS — E876 Hypokalemia: Secondary | ICD-10-CM

## 2021-01-17 NOTE — Telephone Encounter (Signed)
Sent to PCP ?

## 2021-01-25 ENCOUNTER — Ambulatory Visit: Payer: Self-pay | Admitting: Cardiology

## 2021-02-01 ENCOUNTER — Ambulatory Visit: Payer: Self-pay | Admitting: Cardiology

## 2021-02-01 ENCOUNTER — Encounter: Payer: Self-pay | Admitting: Cardiology

## 2021-02-01 ENCOUNTER — Other Ambulatory Visit: Payer: Self-pay

## 2021-02-01 VITALS — BP 169/90 | HR 79 | Temp 98.0°F | Resp 16 | Ht 59.0 in | Wt 176.4 lb

## 2021-02-01 DIAGNOSIS — R072 Precordial pain: Secondary | ICD-10-CM

## 2021-02-01 DIAGNOSIS — I10 Essential (primary) hypertension: Secondary | ICD-10-CM

## 2021-02-01 DIAGNOSIS — Z87891 Personal history of nicotine dependence: Secondary | ICD-10-CM

## 2021-02-01 DIAGNOSIS — Z8249 Family history of ischemic heart disease and other diseases of the circulatory system: Secondary | ICD-10-CM

## 2021-02-01 DIAGNOSIS — Z13220 Encounter for screening for lipoid disorders: Secondary | ICD-10-CM

## 2021-02-01 DIAGNOSIS — H0263 Xanthelasma of right eye, unspecified eyelid: Secondary | ICD-10-CM

## 2021-02-01 MED ORDER — LOSARTAN POTASSIUM 100 MG PO TABS: 100.0000 mg | ORAL_TABLET | Freq: Every day | ORAL | 0 refills | Status: DC

## 2021-02-01 MED ORDER — HYDROCHLOROTHIAZIDE 25 MG PO TABS: 25.0000 mg | ORAL_TABLET | Freq: Every morning | ORAL | 0 refills | Status: DC

## 2021-02-01 NOTE — Progress Notes (Signed)
Date:  02/01/2021   ID:  Erin Pollard, DOB May 15, 1970, MRN [id number]  PCP:  Kerin Perna, NP  Cardiologist:  Rex Kras, DO, [facility] (established care February 01, 2021) Former Cardiology Providers: Dr. Delfina Redwood [facility], [place])  REASON FOR CONSULT: Congestive heart failure evaluation  REQUESTING PHYSICIAN:  Kerin Perna, NP [address] [place],  [place] [address]  Chief Complaint  Patient presents with   Congestive Heart Failure   New Patient (Initial Visit)    HPI  Erin Pollard is a 51 y.o. African-American female who presents to the office with a chief complaint of "elevated BP." Patient's past medical history and cardiovascular risk factors include: Panic attack, anxiety, history of myocardial infarction (04/2015) no intervention, family history of premature CAD, benign essential hypertension, GERD, former smoker, obesity due to excess calories.   She is referred to the office at the request of Kerin Perna, NP for evaluation of congestive heart failure evaluation.  Patient is originally from [place] and in April 2017 patient states that she had a myocardial infarction where she was treated at [facility].  She underwent a left heart catheterization but no interventions were needed.  Later she moved to [facility] and now has moved to [place] as of August 2022.  And [facility] she was being followed by Dr. Delfina Redwood for blood pressure management and then later released to PCP for additional medication management.  She is now referred to [place] for evaluation of congestive heart failure according to the referral; however, patient requests assistance with blood pressure management.   She also had a recent ED visit in November 2022 at [facility] for chest pain and shortness of breath.  Labs during the encounter were reviewed.  High sensitive troponins negative x2 and ECG did not  illustrate myocardial injury pattern.  She was discharged home with outpatient follow-up.  She denies orthopnea, paroxysmal nocturnal dyspnea lower extremity swelling.  At times patient has noticed some chest discomfort over the left anterior precordium, predominantly while driving, cramp-like sensation, not brought on by effort related activities, does not resolve with rest, self-limited, and the pain is not similar to what she experienced in April 2017.  Patient does not have a blood pressure log for me to review; however, based on her memory her SBP ranges between 135-156 mmHg and DBP ranges between 76-89 mmHg.  Family history of premature CAD with mother having a myocardial infarction at the age of 85 and passed.  FUNCTIONAL STATUS: No structured exercise program or daily routine.   ALLERGIES: Allergies  Allergen Reactions   Amlodipine Hives    Achy legs and s.o.b Achy legs and s.o.b    Hydrocodone-Acetaminophen Hives and Rash   Iodinated Contrast Media Hives   Iodine Hives and Rash   Hydrocortisone-Iodoquinol    Propoxyphene     MEDICATION LIST PRIOR TO VISIT: Current Meds  Medication Sig   clonazePAM (KLONOPIN) 0.5 MG tablet Take 0.5 mg by mouth 3 (three) times daily.   hydrALAZINE (APRESOLINE) 25 MG tablet Take 1 tablet (25 mg total) by mouth 3 (three) times daily.   Iron, Ferrous Sulfate, 325 (65 Fe) MG TABS Take 325 mg by mouth daily.   KLOR-CON M10 10 MEQ tablet TAKE 1 TABLET BY MOUTH EVERY DAY   labetalol (NORMODYNE) 200 MG tablet Take 1 tablet (200 mg total) by mouth daily.   mirtazapine (REMERON) 15 MG tablet Take 15 mg by  mouth at bedtime.   omeprazole (PRILOSEC) 40 MG capsule Take 1 capsule (40 mg total) by mouth daily.   ondansetron (ZOFRAN-ODT) 4 MG disintegrating tablet Take 4 mg by mouth as needed.   [DISCONTINUED] hydrochlorothiazide (HYDRODIURIL) 12.5 MG tablet Take 2 tablets (25 mg total) by mouth daily.   [DISCONTINUED] losartan (COZAAR) 50 MG tablet Take  1 tablet (50 mg total) by mouth daily.     PAST MEDICAL HISTORY: Past Medical History:  Diagnosis Date   Anxiety    Cervical radiculopathy    GERD (gastroesophageal reflux disease)    Hypertension    Myocardial infarction ([place])    Panic attack     PAST SURGICAL HISTORY: Past Surgical History:  Procedure Laterality Date   LEFT HEART CATH     TUBAL LIGATION      FAMILY HISTORY: The patient family history includes Cancer in her father; Heart attack in her maternal grandmother and mother; Heart disease in her maternal grandfather and maternal grandmother; Stroke in her father, maternal grandfather, and sister.  SOCIAL HISTORY:  The patient  reports that she has quit smoking. Her smoking use included cigarettes. She does not have any smokeless tobacco history on file. She reports that she does not use drugs.  REVIEW OF SYSTEMS: Review of Systems  Constitutional: Negative for chills and fever.  HENT:  Negative for hoarse voice and nosebleeds.   Eyes:  Negative for discharge, double vision and pain.  Cardiovascular:  Positive for chest pain. Negative for claudication, dyspnea on exertion, leg swelling, near-syncope, orthopnea, palpitations, paroxysmal nocturnal dyspnea and syncope.  Respiratory:  Negative for hemoptysis and shortness of breath.   Musculoskeletal:  Negative for muscle cramps and myalgias.  Gastrointestinal:  Negative for abdominal pain, constipation, diarrhea, hematemesis, hematochezia, melena, nausea and vomiting.  Neurological:  Negative for dizziness and light-headedness.   PHYSICAL EXAM: Vitals with BMI 02/01/2021 02/01/2021 01/11/2021  Height - _0  _1   Weight - 176 lbs 6 oz 176 lbs 6 oz  BMI - 66.29 47.65  Systolic 465 035 465  Diastolic 90 88 86  Pulse 79 74 78    CONSTITUTIONAL: Well-developed and well-nourished. No acute distress.  SKIN: Skin is warm and dry. No rash noted. No cyanosis. No pallor. No jaundice HEAD: Normocephalic and atraumatic.   EYES: No scleral icterus, xanthomas MOUTH/THROAT: Moist oral membranes.  NECK: No JVD present. No thyromegaly noted. No carotid bruits  LYMPHATIC: No visible cervical adenopathy.  CHEST Normal respiratory effort. No intercostal retractions  LUNGS: Clear to auscultation bilaterally.  No stridor. No wheezes. No rales.  CARDIOVASCULAR: Regular rate and rhythm, positive S1-S2, no murmurs rubs or gallops appreciated. ABDOMINAL: Obese, soft, nontender, nondistended, positive bowel sounds in all 4 quadrants, no apparent ascites.  EXTREMITIES: No peripheral edema, warm to touch, 2+ bilateral DP and PT pulses HEMATOLOGIC: No significant bruising NEUROLOGIC: Oriented to person, place, and time. Nonfocal. Normal muscle tone.  PSYCHIATRIC: Normal mood and affect. Normal behavior. Cooperative  RADIOLOGY DATABASE: Chest x-ray 11/13/2020: No active cardiopulmonary disease.  CARDIAC DATABASE: EKG: 02/01/2021: NSR, 70 bpm, without underlying ischemia or injury pattern.  Echocardiogram: 04/2016 [facility] system available in Care Everywhere: LVEF 60 to 68%, normal diastolic filling pattern, normal right ventricular size and function, normal left atrial size, no significant valvular heart disease.  Stress Testing: No results found for this or any previous visit from the past 1095 days.   Heart Catheterization: Last left heart catheterization April 2017 per patient.  We will request records.  LABORATORY DATA: CBC Latest Ref Rng & Units 12/06/2020 11/13/2020  WBC 4.0 - 10.5 K/uL 8.2 6.6  Hemoglobin 12.0 - 15.0 g/dL 11.0(L) 11.0(L)  Hematocrit 36.0 - 46.0 % 35.6(L) 33.9(L)  Platelets 150 - 400 K/uL 385 338    CMP Latest Ref Rng & Units 12/06/2020 11/13/2020  Glucose 70 - 99 mg/dL 94 90  BUN 6 - 20 mg/dL 10 11  Creatinine 0.44 - 1.00 mg/dL 0.66 0.51  Sodium 135 - 145 mmol/L 135 136  Potassium 3.5 - 5.1 mmol/L 3.4(L) 3.4(L)  Chloride 98 - 111 mmol/L 98 103  CO2 22 - 32 mmol/L 27  26  Calcium 8.9 - 10.3 mg/dL 9.2 8.9  Total Protein 6.5 - 8.1 g/dL 7.1 7.4  Total Bilirubin 0.3 - 1.2 mg/dL 0.5 0.5  Alkaline Phos 38 - 126 U/L 82 79  AST 15 - 41 U/L 19 17  ALT 0 - 44 U/L 17 20    Lipid Panel  No results found for: CHOL, TRIG, HDL, CHOLHDL, VLDL, LDLCALC, LDLDIRECT, LABVLDL  No components found for: NTPROBNP No results for input(s): PROBNP in the last 8760 hours. No results for input(s): TSH in the last 8760 hours.  BMP Recent Labs    11/13/20 1719 12/06/20 2138  NA 136 135  K 3.4* 3.4*  CL 103 98  CO2 26 27  GLUCOSE 90 94  BUN 11 10  CREATININE 0.51 0.66  CALCIUM 8.9 9.2  GFRNONAA >60 >60    HEMOGLOBIN A1C Lab Results  Component Value Date   HGBA1C 5.5 01/11/2021    IMPRESSION:    ICD-10-CM   1. Precordial pain  R07.2 EKG 12-Lead    PCV ECHOCARDIOGRAM COMPLETE    PCV CARDIAC STRESS TEST    2. Benign hypertension  I10 losartan (COZAAR) 100 MG tablet    hydrochlorothiazide (HYDRODIURIL) 25 MG tablet    Magnesium    CMP14+EGFR    3. Xanthelasma of eyelid, bilateral  H02.63 Lipid Panel With LDL/HDL Ratio   H02.66 LDL cholesterol, direct    CMP14+EGFR    4. Screening for lipid disorders  Z13.220     5. Former smoker  Z87.891     70. Family history of premature CAD  Z82.49        RECOMMENDATIONS: Erin Pollard is a 51 y.o. African-American female whose past medical history and cardiac risk factors include: Panic attack, anxiety, history of myocardial infarction (04/2015) no intervention, family history of premature CAD, benign essential hypertension, GERD, former smoker, obesity due to excess calories.   Precordial pain Noncardiac based on HPI. Has multiple cardiovascular risk factors as outlined above. Would benefit from a GXT to evaluate for exercise capacity, and exercise-induced ischemia. Echocardiogram will be ordered to evaluate for structural heart disease and left ventricular systolic function. Outside records from [place] reviewed.  Last echo from 2018 noted preserved LVEF. I have been asked to evaluate her for congestive heart failure.  Patient does not carry a history of CHF.  Based on physical examination finding she is overall euvolemic and not in congestive heart failure. Educated on importance of improving her modifiable cardiovascular risk factors  Benign hypertension Patient requesting assistance with blood pressure management. Currently takes hydrochlorothiazide, labetalol, hydralazine, and losartan at 5:30 AM.  At 1:30 PM takes hydralazine.  At 9:30 PM takes hydralazine. Will increase hydrochlorothiazide to 25 mg p.o. every morning. Increase losartan to 100 mg p.o. every afternoon. Blood work in 1 week to evaluate kidney function and electrolytes.  Reemphasized importance of low-salt diet. If possible patient is asked to increase physical activity as tolerated with a goal of 30 minutes a day 5 days a week.  Xanthelasma of eyelid, bilateral Has xanthelasmas bilaterally on physical examination. Per patient she was on atorvastatin in the past but for reasons unknown it was discontinued. We will screen her for hyperlipidemia.  FINAL MEDICATION LIST END OF ENCOUNTER: Meds ordered this encounter  Medications   losartan (COZAAR) 100 MG tablet    Sig: Take 1 tablet (100 mg total) by mouth daily at 10 pm.    Dispense:  30 tablet    Refill:  0   hydrochlorothiazide (HYDRODIURIL) 25 MG tablet    Sig: Take 1 tablet (25 mg total) by mouth every morning.    Dispense:  30 tablet    Refill:  0    Medications Discontinued During This Encounter  Medication Reason   losartan (COZAAR) 50 MG tablet Reorder   hydrochlorothiazide (HYDRODIURIL) 12.5 MG tablet Reorder     Current Outpatient Medications:    clonazePAM (KLONOPIN) 0.5 MG tablet, Take 0.5 mg by mouth 3 (three) times daily., Disp: , Rfl:    hydrALAZINE (APRESOLINE) 25 MG tablet, Take 1 tablet (25 mg total) by mouth 3 (three) times daily.,  Disp: 270 tablet, Rfl: 1   Iron, Ferrous Sulfate, 325 (65 Fe) MG TABS, Take 325 mg by mouth daily., Disp: 90 tablet, Rfl: 1   KLOR-CON M10 10 MEQ tablet, TAKE 1 TABLET BY MOUTH EVERY DAY, Disp: 30 tablet, Rfl: 1   labetalol (NORMODYNE) 200 MG tablet, Take 1 tablet (200 mg total) by mouth daily., Disp: 90 tablet, Rfl: 1   mirtazapine (REMERON) 15 MG tablet, Take 15 mg by mouth at bedtime., Disp: , Rfl:    omeprazole (PRILOSEC) 40 MG capsule, Take 1 capsule (40 mg total) by mouth daily., Disp: 30 capsule, Rfl: 1   ondansetron (ZOFRAN-ODT) 4 MG disintegrating tablet, Take 4 mg by mouth as needed., Disp: , Rfl:    hydrochlorothiazide (HYDRODIURIL) 25 MG tablet, Take 1 tablet (25 mg total) by mouth every morning., Disp: 30 tablet, Rfl: 0   losartan (COZAAR) 100 MG tablet, Take 1 tablet (100 mg total) by mouth daily at 10 pm., Disp: 30 tablet, Rfl: 0  Orders Placed This Encounter  Procedures   Magnesium   Lipid Panel With LDL/HDL Ratio   LDL cholesterol, direct   CMP14+EGFR   PCV CARDIAC STRESS TEST   EKG 12-Lead   PCV ECHOCARDIOGRAM COMPLETE    Patient Instructions  Take hydrochlorothiazide 25 mg p.o. every morning.  Take losartan 100 mg p.o. every afternoon.  Blood work in 1 week to evaluate kidney function and screen for cholesterol.  L[facility] at [facility].  Echo and stress test.   --Continue cardiac medications as reconciled in final medication list. --Return in about 6 weeks (around 03/15/2021) for Follow up, BP, Review test results. Or sooner if needed. --Continue follow-up with your primary care physician regarding the management of your other chronic comorbid conditions.  Patient's questions and concerns were addressed to her satisfaction. She voices understanding of the instructions provided during this encounter.   This note was created using a voice recognition software as a result there may be grammatical errors inadvertently enclosed that do not reflect the nature of  this encounter. Every attempt is made to correct such errors.  Rex Kras, [place], [facility]  Pager: [phone] Office: [phone]

## 2021-02-01 NOTE — Patient Instructions (Signed)
Take hydrochlorothiazide 25 mg p.o. every morning.  Take losartan 100 mg p.o. every afternoon.  Blood work in 1 week to evaluate kidney function and screen for cholesterol.  Labs will be done at [facility].  Echo and stress test.

## 2021-02-09 ENCOUNTER — Ambulatory Visit: Payer: Self-pay

## 2021-02-09 ENCOUNTER — Other Ambulatory Visit: Payer: Self-pay

## 2021-02-09 DIAGNOSIS — I10 Essential (primary) hypertension: Secondary | ICD-10-CM

## 2021-02-09 DIAGNOSIS — H0263 Xanthelasma of right eye, unspecified eyelid: Secondary | ICD-10-CM

## 2021-02-17 LAB — CMP14+EGFR
ALT: 13 IU/L (ref 0–32)
AST: 14 IU/L (ref 0–40)
Albumin/Globulin Ratio: 1.5 (ref 1.2–2.2)
Albumin: 4.4 g/dL (ref 3.8–4.9)
Alkaline Phosphatase: 90 IU/L (ref 44–121)
BUN/Creatinine Ratio: 16 (ref 9–23)
BUN: 11 mg/dL (ref 6–24)
Bilirubin Total: <0.2 mg/dL (ref 0.0–1.2)
CO2: 25 mmol/L (ref 20–29)
Calcium: 9.5 mg/dL (ref 8.7–10.2)
Chloride: 100 mmol/L (ref 96–106)
Creatinine, Ser: 0.69 mg/dL (ref 0.57–1.00)
Globulin, Total: 2.9 g/dL (ref 1.5–4.5)
Glucose: 99 mg/dL (ref 70–99)
Potassium: 3.6 mmol/L (ref 3.5–5.2)
Sodium: 139 mmol/L (ref 134–144)
Total Protein: 7.3 g/dL (ref 6.0–8.5)
eGFR: 104 mL/min/{1.73_m2} (ref >59)

## 2021-02-17 LAB — LIPID PANEL WITH LDL/HDL RATIO
Cholesterol, Total: 223 mg/dL — ABNORMAL HIGH (ref 100–199)
HDL: 44 mg/dL (ref >39)
LDL Chol Calc (NIH): 161 mg/dL — ABNORMAL HIGH (ref 0–99)
LDL/HDL Ratio: 3.7 ratio — ABNORMAL HIGH (ref 0.0–3.2)
Triglycerides: 101 mg/dL (ref 0–149)
VLDL Cholesterol Cal: 18 mg/dL (ref 5–40)

## 2021-02-17 LAB — LDL CHOLESTEROL, DIRECT: LDL Direct: 158 mg/dL — ABNORMAL HIGH (ref 0–99)

## 2021-02-17 LAB — MAGNESIUM: Magnesium: 2 mg/dL (ref 1.6–2.3)

## 2021-02-19 NOTE — Progress Notes (Signed)
Called and spoke with patient regarding recent lab results. Patient is on Losartan  and HCTZ as directed. She is aware that all tests and remainder of labs will be discussed during next office visits.

## 2021-02-20 ENCOUNTER — Other Ambulatory Visit (INDEPENDENT_AMBULATORY_CARE_PROVIDER_SITE_OTHER): Payer: Self-pay | Admitting: Primary Care

## 2021-02-20 DIAGNOSIS — E876 Hypokalemia: Secondary | ICD-10-CM

## 2021-02-20 NOTE — Telephone Encounter (Signed)
Sent to PCP ?

## 2021-02-23 ENCOUNTER — Other Ambulatory Visit: Payer: Self-pay

## 2021-02-23 ENCOUNTER — Ambulatory Visit: Payer: Self-pay

## 2021-02-23 DIAGNOSIS — R072 Precordial pain: Secondary | ICD-10-CM

## 2021-02-26 ENCOUNTER — Other Ambulatory Visit: Payer: Self-pay

## 2021-02-26 ENCOUNTER — Other Ambulatory Visit (INDEPENDENT_AMBULATORY_CARE_PROVIDER_SITE_OTHER): Payer: Self-pay | Admitting: Primary Care

## 2021-02-26 NOTE — Progress Notes (Signed)
Spoke with patient, she confirmed she will be here at her scheduled appointment on the 03/15/21.

## 2021-03-06 NOTE — Progress Notes (Signed)
Called and spoke with patient regarding her echocardiogram results. Patient confirmed appointment, will pick up copy of echo at office visit.

## 2021-03-15 ENCOUNTER — Other Ambulatory Visit: Payer: Self-pay

## 2021-03-15 ENCOUNTER — Ambulatory Visit: Payer: Self-pay | Admitting: Cardiology

## 2021-03-15 ENCOUNTER — Encounter: Payer: Self-pay | Admitting: Cardiology

## 2021-03-15 VITALS — BP 128/90 | HR 84 | Temp 98.1°F | Resp 16 | Ht 59.0 in | Wt 170.6 lb

## 2021-03-15 DIAGNOSIS — Z87891 Personal history of nicotine dependence: Secondary | ICD-10-CM

## 2021-03-15 DIAGNOSIS — I10 Essential (primary) hypertension: Secondary | ICD-10-CM

## 2021-03-15 DIAGNOSIS — H0263 Xanthelasma of right eye, unspecified eyelid: Secondary | ICD-10-CM

## 2021-03-15 DIAGNOSIS — H0266 Xanthelasma of left eye, unspecified eyelid: Secondary | ICD-10-CM

## 2021-03-15 DIAGNOSIS — Z8249 Family history of ischemic heart disease and other diseases of the circulatory system: Secondary | ICD-10-CM

## 2021-03-15 DIAGNOSIS — E7800 Pure hypercholesterolemia, unspecified: Secondary | ICD-10-CM

## 2021-03-15 DIAGNOSIS — R072 Precordial pain: Secondary | ICD-10-CM

## 2021-03-15 DIAGNOSIS — R9439 Abnormal result of other cardiovascular function study: Secondary | ICD-10-CM

## 2021-03-15 MED ORDER — ATORVASTATIN CALCIUM 40 MG PO TABS: 40.0000 mg | ORAL_TABLET | Freq: Every day | ORAL | 2 refills | Status: DC

## 2021-03-15 NOTE — Progress Notes (Signed)
Date:  03/15/2021   ID:  Erin Pollard, DOB Oct 20, 1970, MRN [id number]  PCP:  Kerin Perna, NP  Cardiologist:  Rex Kras, DO, [facility] (established care February 01, 2021) Former Cardiology Providers: Dr. Delfina Redwood [facility], [place])  Date: 03/15/21 Last Office Visit: 02/01/2021  Chief Complaint  Patient presents with   Hypertension   Results   Follow-up    HPI  Erin Pollard is a 51 y.o. African-American female who presents to the office with a chief complaint of " reevaluation of chest pain, blood pressure management, and discuss test results." Patient's past medical history and cardiovascular risk factors include: Panic attack, anxiety, history of myocardial infarction (04/2015) no intervention, family history of premature CAD, benign essential hypertension, GERD, former smoker, obesity due to excess calories.   Initially referred to the practice back in January 2023 for evaluation and management of congestive heart failure.  Based on physical examination she was euvolemic and therefore recommended to undergo echocardiogram.  Patient is noted to have preserved LVEF and normal diastolic function.  Findings are not consistent with congestive heart failure.  Given her symptoms of precordial discomfort appear to be noncardiac based on symptoms however given her risk factors underwent GXT.  Her GXT is concerning for exercise-induced ischemia.  Results reviewed with her in great detail including the EKG strips.  Patient at times continues to have a degree of substernal discomfort, cramp-like sensation, not always brought on by effort related activities, does not resolve with rest, self-limited.  Patient's blood pressures have improved significantly since last office visit.  Her home blood pressure log reviewed and on visual estimation SBP is consistently less than 130 mmHg.  Patient is thankful for the medication changes recommended at the last visit.  On physical examination  she had xanthelasma involving bilateral eyelids.  She had a fasting lipid profile which noted a total cholesterol of 223 and LDL of 158 mg/dL.  Patient states that she has never been on cholesterol medications in the past.  Family history of premature CAD with mother having a myocardial infarction at the age of 61 and passed.  FUNCTIONAL STATUS: No structured exercise program or daily routine.   ALLERGIES: Allergies  Allergen Reactions   Amlodipine Hives    Achy legs and s.o.b Achy legs and s.o.b    Hydrocodone-Acetaminophen Hives and Rash   Iodinated Contrast Media Hives   Iodine Hives and Rash   Hydrocortisone-Iodoquinol    Propoxyphene     MEDICATION LIST PRIOR TO VISIT: Current Meds  Medication Sig   atorvastatin (LIPITOR) 40 MG tablet Take 1 tablet (40 mg total) by mouth at bedtime.   clonazePAM (KLONOPIN) 0.5 MG tablet Take 0.5 mg by mouth 3 (three) times daily.   hydrALAZINE (APRESOLINE) 25 MG tablet Take 1 tablet (25 mg total) by mouth 3 (three) times daily.   hydrochlorothiazide (HYDRODIURIL) 25 MG tablet Take 1 tablet (25 mg total) by mouth every morning.   Iron, Ferrous Sulfate, 325 (65 Fe) MG TABS Take 325 mg by mouth daily.   labetalol (NORMODYNE) 200 MG tablet Take 1 tablet (200 mg total) by mouth daily.   losartan (COZAAR) 100 MG tablet Take 1 tablet (100 mg total) by mouth daily at 10 pm.   mirtazapine (REMERON) 15 MG tablet Take 15 mg by mouth at bedtime.   omeprazole (PRILOSEC) 40 MG capsule Take 1 capsule (40 mg total) by mouth daily.   ondansetron (ZOFRAN-ODT) 4 MG disintegrating tablet Take 4 mg by mouth  as needed.   potassium chloride (KLOR-CON M10) 10 MEQ tablet TAKE 1 TABLET BY MOUTH EVERY DAY     PAST MEDICAL HISTORY: Past Medical History:  Diagnosis Date   Anxiety    Cervical radiculopathy    GERD (gastroesophageal reflux disease)    Hypertension    Myocardial infarction ([place])    Panic attack     PAST SURGICAL HISTORY: Past Surgical History:   Procedure Laterality Date   LEFT HEART CATH     TUBAL LIGATION      FAMILY HISTORY: The patient family history includes Cancer in her father; Heart attack in her maternal grandmother and mother; Heart disease in her maternal grandfather and maternal grandmother; Stroke in her father, maternal grandfather, and sister.  SOCIAL HISTORY:  The patient  reports that she has quit smoking. Her smoking use included cigarettes. She does not have any smokeless tobacco history on file. She reports that she does not currently use alcohol. She reports that she does not use drugs.  REVIEW OF SYSTEMS: Review of Systems  Cardiovascular:  Negative for chest pain, dyspnea on exertion, leg swelling, orthopnea, palpitations, paroxysmal nocturnal dyspnea and syncope.  Respiratory:  Negative for shortness of breath.    PHYSICAL EXAM: Vitals with BMI 03/15/2021 02/01/2021 02/01/2021  Height '4\' 11"'  - '4\' 11"'   Weight 170 lbs 10 oz - 176 lbs 6 oz  BMI 28.31 - 51.76  Systolic 160 737 106  Diastolic 90 90 88  Pulse 84 79 74    CONSTITUTIONAL: Well-developed and well-nourished. No acute distress.  SKIN: Skin is warm and dry. No rash noted. No cyanosis. No pallor. No jaundice HEAD: Normocephalic and atraumatic.  EYES: No scleral icterus, xanthomas MOUTH/THROAT: Moist oral membranes.  NECK: No JVD present. No thyromegaly noted. No carotid bruits  LYMPHATIC: No visible cervical adenopathy.  CHEST Normal respiratory effort. No intercostal retractions  LUNGS: Clear to auscultation bilaterally.  No stridor. No wheezes. No rales.  CARDIOVASCULAR: Regular rate and rhythm, positive S1-S2, no murmurs rubs or gallops appreciated. ABDOMINAL: Obese, soft, nontender, nondistended, positive bowel sounds in all 4 quadrants, no apparent ascites.  EXTREMITIES: No peripheral edema, warm to touch, 2+ bilateral DP and PT pulses HEMATOLOGIC: No significant bruising NEUROLOGIC: Oriented to person, place, and time. Nonfocal. Normal  muscle tone.  PSYCHIATRIC: Normal mood and affect. Normal behavior. Cooperative  RADIOLOGY DATABASE: Chest x-ray 11/13/2020: No active cardiopulmonary disease.  CARDIAC DATABASE: EKG: 02/01/2021: NSR, 70 bpm, without underlying ischemia or injury pattern.  Echocardiogram: 04/2016 [place] system available in Care Everywhere: LVEF 60 to 26%, normal diastolic filling pattern, normal right ventricular size and function, normal left atrial size, no significant valvular heart disease.  02/23/2021: Normal LV systolic function with visual EF 60-65%. Left ventricle cavity is normal in size. Normal left ventricular wall thickness. Normal global wall motion. Normal diastolic filling pattern, normal LAP. No significant valvular heart disease. Anechoic structure within the liver parenchyma measuring 3.34 x 4.05cm likely a cyst, consider dedicated ultrasound of abdomen if clinically indicated.  Compared to outside study 04/2016 no significant change.   Stress Testing: Exercise treadmill stress test 02/23/2021: Functional status: Fair. Chest pain: No. Reason for stopping exercise: Dyspnea, patient request. Hypertensive response to exercise: No. Exercise time 5 minutes 59 seconds on Bruce protocol, achieved 7.05 METS, 88% of age-predicted maximum heart rate (APMHR).  Stress ECG positive for ischemia.  Intermediate risk study. Consider further cardiac work up if clinically indicated. Clinical correlation required.    Heart Catheterization: Last  left heart catheterization April 2017 per patient.  We will request records.  LABORATORY DATA: CBC Latest Ref Rng & Units 12/06/2020 11/13/2020  WBC 4.0 - 10.5 K/uL 8.2 6.6  Hemoglobin 12.0 - 15.0 g/dL 11.0(L) 11.0(L)  Hematocrit 36.0 - 46.0 % 35.6(L) 33.9(L)  Platelets 150 - 400 K/uL 385 338    CMP Latest Ref Rng & Units 02/16/2021 12/06/2020 11/13/2020  Glucose 70 - 99 mg/dL 99 94 90  BUN 6 - 24 mg/dL '11 10 11  ' Creatinine 0.57 -  1.00 mg/dL 0.69 0.66 0.51  Sodium 134 - 144 mmol/L 139 135 136  Potassium 3.5 - 5.2 mmol/L 3.6 3.4(L) 3.4(L)  Chloride 96 - 106 mmol/L 100 98 103  CO2 20 - 29 mmol/L '25 27 26  ' Calcium 8.7 - 10.2 mg/dL 9.5 9.2 8.9  Total Protein 6.0 - 8.5 g/dL 7.3 7.1 7.4  Total Bilirubin 0.0 - 1.2 mg/dL <0.2 0.5 0.5  Alkaline Phos 44 - 121 IU/L 90 82 79  AST 0 - 40 IU/L '14 19 17  ' ALT 0 - 32 IU/L '13 17 20    ' Lipid Panel     Component Value Date/Time   CHOL 223 (H) 02/16/2021 0803   TRIG 101 02/16/2021 0803   HDL 44 02/16/2021 0803   LDLCALC 161 (H) 02/16/2021 0803   LDLDIRECT 158 (H) 02/16/2021 0803   LABVLDL 18 02/16/2021 0803    No components found for: NTPROBNP No results for input(s): PROBNP in the last 8760 hours. No results for input(s): TSH in the last 8760 hours.  BMP Recent Labs    11/13/20 1719 12/06/20 2138 02/16/21 0803  NA 136 135 139  K 3.4* 3.4* 3.6  CL 103 98 100  CO2 '26 27 25  ' GLUCOSE 90 94 99  BUN '11 10 11  ' CREATININE 0.51 0.66 0.69  CALCIUM 8.9 9.2 9.5  GFRNONAA >60 >60  --     HEMOGLOBIN A1C Lab Results  Component Value Date   HGBA1C 5.5 01/11/2021    IMPRESSION:    ICD-10-CM   1. Abnormal stress ECG with treadmill  R94.39 PCV MYOCARDIAL PERFUSION WO LEXISCAN    2. Pure hypercholesterolemia  E78.00 atorvastatin (LIPITOR) 40 MG tablet    Lipid Panel With LDL/HDL Ratio    Lipoprotein A (LPA)    Apo A1 + B + Ratio    CMP14+EGFR    3. Family history of premature CAD  Z82.49 atorvastatin (LIPITOR) 40 MG tablet    4. Xanthelasma of eyelid, bilateral  H02.63 atorvastatin (LIPITOR) 40 MG tablet   H02.66     5. Benign hypertension  I10     6. Precordial pain  R07.2     7. Former smoker  Z87.891        RECOMMENDATIONS: Erin Pollard is a 51 y.o. African-American female whose past medical history and cardiac risk factors include: Panic attack, anxiety, history of myocardial infarction (04/2015) no intervention, family history of premature CAD,  benign essential hypertension, GERD, former smoker, obesity due to excess calories.   Abnormal stress ECG with treadmill Stress ECG concerning for exercise-induced ischemia. Patient does not have any active precordial discomfort. Symptoms are still consistent with noncardiac discomfort. However, given her multiple cardiovascular risk factors including family history of premature CAD, pure hypercholesterolemia, former smoker, hypertension that shared decision was to proceed with exercise nuclear stress test. Educated on seeking medical attention sooner by going to the closest ER via EMS if the symptoms increase in intensity, frequency, duration, or  has typical chest pain as discussed in the office.  Patient verbalized understanding.  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 Start atorvastatin 40 mg p.o. nightly. Fasting lipid profile in 6 weeks to reevaluate therapy and LFTs. If the LDL levels are not at goal we will increase the atorvastatin to 80 mg p.o. nightly along with Zetia at the next office visit.  Benign hypertension Blood pressures have improved since last office visit. Continue current medical therapy. Reemphasized importance of low-salt diet.  Precordial pain Reviewed the results of the echo and a GXT with the patient in great detail as discussed above. See above  On the echocardiogram there was an incidental finding of an anechoic structure within the liver parenchyma likely suggestive of a cyst.  Recommended dedicated abdominal ultrasound for further evaluation and management.  Patient states that she will follow-up with her PCP.  During this office encounter discussed management of at least 2 chronic comorbid conditions, reviewed the results of the echo/stress test/independent review of  labs, ordered additional diagnostic testing for further evaluation and management.  I would like to see the patient back in 7 weeks or sooner if her stress test is abnormal.  Patient is agreeable with the plan of care.  Her questions and concerns addressed to her satisfaction.  FINAL MEDICATION LIST END OF ENCOUNTER: Meds ordered this encounter  Medications   atorvastatin (LIPITOR) 40 MG tablet    Sig: Take 1 tablet (40 mg total) by mouth at bedtime.    Dispense:  30 tablet    Refill:  2    There are no discontinued medications.    Current Outpatient Medications:    atorvastatin (LIPITOR) 40 MG tablet, Take 1 tablet (40 mg total) by mouth at bedtime., Disp: 30 tablet, Rfl: 2   clonazePAM (KLONOPIN) 0.5 MG tablet, Take 0.5 mg by mouth 3 (three) times daily., Disp: , Rfl:    hydrALAZINE (APRESOLINE) 25 MG tablet, Take 1 tablet (25 mg total) by mouth 3 (three) times daily., Disp: 270 tablet, Rfl: 1   hydrochlorothiazide (HYDRODIURIL) 25 MG tablet, Take 1 tablet (25 mg total) by mouth every morning., Disp: 30 tablet, Rfl: 0   Iron, Ferrous Sulfate, 325 (65 Fe) MG TABS, Take 325 mg by mouth daily., Disp: 90 tablet, Rfl: 1   labetalol (NORMODYNE) 200 MG tablet, Take 1 tablet (200 mg total) by mouth daily., Disp: 90 tablet, Rfl: 1   losartan (COZAAR) 100 MG tablet, Take 1 tablet (100 mg total) by mouth daily at 10 pm., Disp: 30 tablet, Rfl: 0   mirtazapine (REMERON) 15 MG tablet, Take 15 mg by mouth at bedtime., Disp: , Rfl:    omeprazole (PRILOSEC) 40 MG capsule, Take 1 capsule (40 mg total) by mouth daily., Disp: 30 capsule, Rfl: 1   ondansetron (ZOFRAN-ODT) 4 MG disintegrating tablet, Take 4 mg by mouth as needed., Disp: , Rfl:    potassium chloride (KLOR-CON M10) 10 MEQ tablet, TAKE 1 TABLET BY MOUTH EVERY DAY, Disp: 30 tablet, Rfl: 0  Orders Placed This Encounter  Procedures   Lipid Panel With LDL/HDL Ratio   Lipoprotein A (LPA)   Apo A1 + B + Ratio   CMP14+EGFR   PCV MYOCARDIAL  PERFUSION WO LEXISCAN    There are no Patient Instructions on file for this visit.   --Continue cardiac medications as reconciled in final medication  list. --Return in about 7 weeks (around 05/03/2021) for Follow up, Stress test, Lipid. Or sooner if needed. --Continue follow-up with your primary care physician regarding the management of your other chronic comorbid conditions.  Patient's questions and concerns were addressed to her satisfaction. She voices understanding of the instructions provided during this encounter.   This note was created using a voice recognition software as a result there may be grammatical errors inadvertently enclosed that do not reflect the nature of this encounter. Every attempt is made to correct such errors.  Rex Kras, [place],  [facility]  Pager: [phone] Office: [phone]

## 2021-03-23 ENCOUNTER — Other Ambulatory Visit (INDEPENDENT_AMBULATORY_CARE_PROVIDER_SITE_OTHER): Payer: Self-pay | Admitting: Primary Care

## 2021-03-23 DIAGNOSIS — E876 Hypokalemia: Secondary | ICD-10-CM

## 2021-03-23 NOTE — Telephone Encounter (Signed)
Requested Prescriptions  ?Pending Prescriptions Disp Refills  ?? KLOR-CON M10 10 MEQ tablet [Pharmacy Med Name: KLOR-CON M10 TABLET] 30 tablet 0  ?  Sig: TAKE 1 TABLET BY MOUTH EVERY DAY  ?  ? Endocrinology:  Minerals - Potassium Supplementation Passed - 03/23/2021  9:36 AM  ?  ?  Passed - K in normal range and within 360 days  ?  Potassium  ?Date Value Ref Range Status  ?02/16/2021 3.6 3.5 - 5.2 mmol/L Final  ?   ?  ?  Passed - Cr in normal range and within 360 days  ?  Creatinine, Ser  ?Date Value Ref Range Status  ?02/16/2021 0.69 0.57 - 1.00 mg/dL Final  ?   ?  ?  Passed - Valid encounter within last 12 months  ?  Recent Outpatient Visits   ?      ? 2 months ago Screening for diabetes mellitus  ? [facility] Grayce Sessions, NP  ? 2 months ago Encounter to establish care  ? [facility] Grayce Sessions, NP  ?  ?  ?Future Appointments   ?        ? In 1 month Tessa Lerner, DO [facility]  ?  ? ?  ?  ?  ? ? ?

## 2021-03-30 ENCOUNTER — Other Ambulatory Visit: Payer: Self-pay

## 2021-03-30 DIAGNOSIS — E7800 Pure hypercholesterolemia, unspecified: Secondary | ICD-10-CM

## 2021-04-02 ENCOUNTER — Other Ambulatory Visit: Payer: Self-pay

## 2021-04-02 ENCOUNTER — Ambulatory Visit: Payer: Self-pay

## 2021-04-02 DIAGNOSIS — R9439 Abnormal result of other cardiovascular function study: Secondary | ICD-10-CM

## 2021-04-06 NOTE — Progress Notes (Signed)
Called pt to inform her about her stress test. Pt understood.

## 2021-04-14 ENCOUNTER — Other Ambulatory Visit (INDEPENDENT_AMBULATORY_CARE_PROVIDER_SITE_OTHER): Payer: Self-pay | Admitting: Primary Care

## 2021-04-14 DIAGNOSIS — E876 Hypokalemia: Secondary | ICD-10-CM

## 2021-04-19 ENCOUNTER — Ambulatory Visit (INDEPENDENT_AMBULATORY_CARE_PROVIDER_SITE_OTHER): Payer: Self-pay | Admitting: Primary Care

## 2021-04-19 ENCOUNTER — Encounter (INDEPENDENT_AMBULATORY_CARE_PROVIDER_SITE_OTHER): Payer: Self-pay | Admitting: Primary Care

## 2021-04-19 VITALS — BP 143/86 | HR 88 | Temp 98.4°F | Ht 59.0 in | Wt 169.6 lb

## 2021-04-19 DIAGNOSIS — E66812 Obesity, class 2: Secondary | ICD-10-CM

## 2021-04-19 DIAGNOSIS — I10 Essential (primary) hypertension: Secondary | ICD-10-CM

## 2021-04-19 NOTE — Patient Instructions (Signed)

## 2021-04-20 ENCOUNTER — Encounter (HOSPITAL_COMMUNITY): Payer: Self-pay

## 2021-04-20 ENCOUNTER — Other Ambulatory Visit: Payer: Self-pay

## 2021-04-20 ENCOUNTER — Emergency Department (HOSPITAL_COMMUNITY)
Admission: EM | Admit: 2021-04-20 | Discharge: 2021-04-20 | Disposition: A | Discharge status: Home / Self Care | Payer: Self-pay | Attending: Emergency Medicine | Admitting: Emergency Medicine

## 2021-04-20 ENCOUNTER — Emergency Department (HOSPITAL_COMMUNITY): Payer: Self-pay

## 2021-04-20 DIAGNOSIS — H6121 Impacted cerumen, right ear: Secondary | ICD-10-CM | POA: Insufficient documentation

## 2021-04-20 DIAGNOSIS — K2970 Gastritis, unspecified, without bleeding: Secondary | ICD-10-CM | POA: Insufficient documentation

## 2021-04-20 DIAGNOSIS — E876 Hypokalemia: Secondary | ICD-10-CM | POA: Insufficient documentation

## 2021-04-20 LAB — URINALYSIS, ROUTINE W REFLEX MICROSCOPIC
Bilirubin Urine: NEGATIVE
Glucose, UA: NEGATIVE mg/dL
Hgb urine dipstick: NEGATIVE
Ketones, ur: NEGATIVE mg/dL
Leukocytes,Ua: NEGATIVE
Nitrite: NEGATIVE
Protein, ur: NEGATIVE mg/dL
Specific Gravity, Urine: 1.023 (ref 1.005–1.030)
pH: 5 (ref 5.0–8.0)

## 2021-04-20 LAB — COMPREHENSIVE METABOLIC PANEL
ALT: 17 U/L (ref 0–44)
AST: 16 U/L (ref 15–41)
Albumin: 4.3 g/dL (ref 3.5–5.0)
Alkaline Phosphatase: 79 U/L (ref 38–126)
Anion gap: 8 (ref 5–15)
BUN: 9 mg/dL (ref 6–20)
CO2: 30 mmol/L (ref 22–32)
Calcium: 9.6 mg/dL (ref 8.9–10.3)
Chloride: 100 mmol/L (ref 98–111)
Creatinine, Ser: 0.65 mg/dL (ref 0.44–1.00)
GFR, Estimated: >60 mL/min (ref >60)
Glucose, Bld: 100 mg/dL — ABNORMAL HIGH (ref 70–99)
Potassium: 3.2 mmol/L — ABNORMAL LOW (ref 3.5–5.1)
Sodium: 138 mmol/L (ref 135–145)
Total Bilirubin: 0.4 mg/dL (ref 0.3–1.2)
Total Protein: 8 g/dL (ref 6.5–8.1)

## 2021-04-20 LAB — CBC WITH DIFFERENTIAL/PLATELET
Abs Immature Granulocytes: 0.02 10*3/uL (ref 0.00–0.07)
Basophils Absolute: 0 10*3/uL (ref 0.0–0.1)
Basophils Relative: 0 %
Eosinophils Absolute: 0.1 10*3/uL (ref 0.0–0.5)
Eosinophils Relative: 2 %
HCT: 36.4 % (ref 36.0–46.0)
Hemoglobin: 11.9 g/dL — ABNORMAL LOW (ref 12.0–15.0)
Immature Granulocytes: 0 %
Lymphocytes Relative: 32 %
Lymphs Abs: 1.9 10*3/uL (ref 0.7–4.0)
MCH: 26 pg (ref 26.0–34.0)
MCHC: 32.7 g/dL (ref 30.0–36.0)
MCV: 79.6 fL — ABNORMAL LOW (ref 80.0–100.0)
Monocytes Absolute: 0.6 10*3/uL (ref 0.1–1.0)
Monocytes Relative: 9 %
Neutro Abs: 3.4 10*3/uL (ref 1.7–7.7)
Neutrophils Relative %: 57 %
Platelets: 338 10*3/uL (ref 150–400)
RBC: 4.57 MIL/uL (ref 3.87–5.11)
RDW: 15.4 % (ref 11.5–15.5)
WBC: 6 10*3/uL (ref 4.0–10.5)
nRBC: 0 % (ref 0.0–0.2)

## 2021-04-20 LAB — TROPONIN I (HIGH SENSITIVITY): Troponin I (High Sensitivity): 2 ng/L (ref <18)

## 2021-04-20 LAB — LIPASE, BLOOD: Lipase: 30 U/L (ref 11–51)

## 2021-04-20 MED ORDER — ALUM & MAG HYDROXIDE-SIMETH 200-200-20 MG/5ML PO SUSP
30.0000 mL | Freq: Once | ORAL | Status: AC
  Administered 2021-04-20: 30 mL via ORAL
  Filled 2021-04-20: qty 30

## 2021-04-20 MED ORDER — SUCRALFATE 1 G PO TABS: 1.0000 g | ORAL_TABLET | Freq: Three times a day (TID) | ORAL | 0 refills | Status: AC

## 2021-04-20 MED ORDER — LIDOCAINE VISCOUS HCL 2 % MT SOLN
15.0000 mL | Freq: Once | OROMUCOSAL | Status: AC
  Administered 2021-04-20: 15 mL via ORAL
  Filled 2021-04-20: qty 15

## 2021-04-20 MED ORDER — OMEPRAZOLE 20 MG PO CPDR: 20.0000 mg | DELAYED_RELEASE_CAPSULE | Freq: Every day | ORAL | 0 refills | Status: DC

## 2021-04-20 MED ORDER — DOCUSATE SODIUM 100 MG PO CAPS: 100.0000 mg | ORAL_CAPSULE | Freq: Two times a day (BID) | ORAL | 0 refills | Status: DC

## 2021-04-20 NOTE — ED Triage Notes (Signed)
Patient went to PCP yesterday. On 3/13 had stress test done. When doc checked her right ear, she said it was impacted. But patient does not have insurance to get it removed. She said she also has diverticulosis and said she might be inflamed right now. She has pain in the center of her chest, she thinks it could be gas.  ?

## 2021-04-20 NOTE — ED Provider Notes (Signed)
?[facility] DEPT ?[facility] Emergency Department ?Provider Note ?MRN:  [id number]  ?Arrival date & time: 04/20/21    ? ?Chief Complaint   ?Ear Pain ?  ?History of Present Illness   ?Erin Pollard is a 51 y.o. year-old female presents to the ED with chief complaint of epigastric abdominal pain that she says feels like gas.  She states that she is out of her omeprazole and her carafate and needs a refill.  She states that her symptoms worsen with eating.  She states that her symptoms have improved since being here.   ? ?Patient also complains of right sided cerumen impaction and she is working to get scheduled with ENT to have her ears cleaned out. ? ?History provided by patient. ? ? ?Review of Systems  ?Pertinent review of systems noted in HPI.  ? ? ?Physical Exam  ? ?Vitals:  ? 04/20/21 2000 04/20/21 2309  ?BP: (!) 148/78 (!) 145/88  ?Pulse: 72 80  ?Resp: 12 16  ?Temp:    ?SpO2: 95% 96%  ?  ?CONSTITUTIONAL:  well-appearing, NAD ?NEURO:  Alert and oriented x 3, CN 3-12 grossly intact ?EYES:  eyes equal and reactive ?ENT/NECK:  Supple, no stridor, R cerumen impaction ?CARDIO:  normal rate, regular rhythm, appears well-perfused  ?PULM:  No respiratory distress,  ?GI/GU:  non-distended, no focal tenderness ?MSK/SPINE:  No gross deformities, no edema, moves all extremities  ?SKIN:  no rash, atraumatic ? ? ?*Additional and/or pertinent findings included in MDM below ? ?Diagnostic and Interventional Summary  ? ? EKG Interpretation ? ?Date/Time:    ?Ventricular Rate:    ?PR Interval:    ?QRS Duration:   ?QT Interval:    ?QTC Calculation:   ?R Axis:     ?Text Interpretation:   ?  ? ?  ? ?Labs Reviewed  ?COMPREHENSIVE METABOLIC PANEL - Abnormal; Notable for the following components:  ?    Result Value  ? Potassium 3.2 (*)   ? Glucose, Bld 100 (*)   ? All other components within normal limits  ?CBC WITH DIFFERENTIAL/PLATELET - Abnormal; Notable for the following components:  ? Hemoglobin 11.9 (*)   ? MCV 79.6 (*)    ? All other components within normal limits  ?LIPASE, BLOOD  ?URINALYSIS, ROUTINE W REFLEX MICROSCOPIC  ?TROPONIN I (HIGH SENSITIVITY)  ?TROPONIN I (HIGH SENSITIVITY)  ?  ?DG Chest 2 View  ?Final Result  ?  ?  ?Medications  ?alum & mag hydroxide-simeth (MAALOX/MYLANTA) 200-200-20 MG/5ML suspension 30 mL (30 mLs Oral Given 04/20/21 2309)  ?  And  ?lidocaine (XYLOCAINE) 2 % viscous mouth solution 15 mL (15 mLs Oral Given 04/20/21 2309)  ?  ? ?Procedures  /  Critical Care ?Procedures ? ?ED Course and Medical Decision Making  ?I have reviewed the triage vital signs, the nursing notes, and pertinent available records from the EMR. ? ?Complexity of Problems Addressed: ?High Complexity: Acute illness/injury posing a threat to life or bodily function, requiring emergent diagnostic workup, evaluation, and treatment as below. ?Comorbidities affecting this illness/injury include: ?Abnormal stress test ?Social Determinants Affecting Care: ?Complexity of care is increased due to access to medical care. ? ? ?ED Course: ?After considering the following differential, ACS, GERD, pancreatitis, I agree with orders placed in triage. ?I personally interpreted the labs which are notable for normal trop, mild hypokalemia, patient encouraged to double up on her potassium dose for the next 2-3 days. ?I visualized the chest x-ray which is notable for no free air or  opacity and agree with the radiologist interpretation. ?EKG shows no acute ischemic changes . ? ?  ? ?Consultants: ?No consultations were needed in caring for this patient. ? ?Treatment and Plan: ?Patient symptoms seem consistent with gastritis.  Doubt ACS based on description and reassuring labs and EKG.  Patient has not had her omeprazole or carafate.  I think this is contributing to her symptoms.  I do not think that additional emergent workup is indicated and feel that patient can be safely discharged home with outpatient follow-up. ? ?Regarding cerumen impaction, we will trial  colace and home irrigations while she is waiting to get into see ENT. ? ?Emergency department workup does not suggest an emergent condition requiring admission or immediate intervention beyond  what has been performed at this time. The patient is safe for discharge and has  been instructed to return immediately for worsening symptoms, change in  symptoms or any other concerns ? ? ? ?Final Clinical Impressions(s) / ED Diagnoses  ? ?  ICD-10-CM   ?1. Gastritis, presence of bleeding unspecified, unspecified chronicity, unspecified gastritis type  K29.70   ?  ?2. Impacted cerumen of right ear  H61.21   ?  ?  ?ED Discharge Orders   ? ?      Ordered  ?  omeprazole (PRILOSEC) 20 MG capsule  Daily       ? 04/20/21 2229  ?  sucralfate (CARAFATE) 1 g tablet  3 times daily with meals & bedtime       ? 04/20/21 2229  ?  docusate sodium (COLACE) 100 MG capsule  Every 12 hours       ? 04/20/21 2229  ? ?  ?  ? ?  ?  ? ? ?Discharge Instructions Discussed with and Provided to Patient:  ? ?Discharge Instructions   ?None ?  ? ?  ?Montine Circle, PA-C ?04/20/21 2326 ? ?  ?Gareth Morgan, MD ?04/21/21 1231 ? ?

## 2021-04-20 NOTE — ED Provider Triage Note (Signed)
Emergency Medicine Provider Triage Evaluation Note ? ?Erin Pollard , a 51 y.o. female  was evaluated in triage.  Pt complains of chest/epigastric pain and right ear pain. Pt has hx of MI in 2017 and recently had normal ECG stress test on 3/13. She saw her PCP yesterday for her epigastric/substernal chest pain and was told it was likely acid reflux. Incidentally, they noted that her right ear was impacted with cerumen and she feels increased pain today. Denies numbness, tingling, sob ? ?Review of Systems  ?Positive: As above ?Negative:  ? ?Physical Exam  ?BP (!) 148/78   Pulse 72   Temp 98.4 ?F (36.9 ?C) (Oral)   Resp 12   SpO2 95%  ?Gen:   Awake, no distress   ?Resp:  Normal effort  ?MSK:   Moves extremities without difficulty  ?Other:   ? ?Medical Decision Making  ?Medically screening exam initiated at 8:36 PM.  Appropriate orders placed.  Erin Pollard was informed that the remainder of the evaluation will be completed by another provider, this initial triage assessment does not replace that evaluation, and the importance of remaining in the ED until their evaluation is complete. ? ? ?  ?Tonye Pearson, [place] ?04/20/21 2040 ? ?

## 2021-04-20 NOTE — ED Notes (Signed)
Discharge instructions reviewed, questions answered. Rx education provided. Pt states understanding and no further questions. Pt ambulatory with steady gait upon discharge. No s/s of distress noted. ? ?

## 2021-04-29 NOTE — Progress Notes (Signed)
?[facility] ? ? ?Erin Pollard is a 51 y.o. female presents for hypertension evaluation, Denies shortness of breath, headaches, chest pain or lower extremity edema, sudden onset, vision changes, unilateral weakness, dizziness, paresthesias  ? ?Patient reports adherence with medications. ? ?Dietary habits include: monitoring sodium intake  ?Exercise habits include:walking  ?Family / Social history: unknown  ? ? ?Past Medical History:  ?Diagnosis Date  ?? Anxiety   ?? Cervical radiculopathy   ?? GERD (gastroesophageal reflux disease)   ?? Hypertension   ?? Myocardial infarction [facility](HCC)   ?? Panic attack   ? ?Past Surgical History:  ?Procedure Laterality Date  ?? LEFT HEART CATH    ?? TUBAL LIGATION    ? ?Allergies  ?Allergen Reactions  ?? Amlodipine Hives  ?  Achy legs and s.o.b ?Achy legs and s.o.b ?  ?? Hydrocodone-Acetaminophen Hives and Rash  ?? Iodinated Contrast Media Hives  ?? Iodine Hives and Rash  ?? Hydrocortisone-Iodoquinol   ?? Propoxyphene   ? ?Current Outpatient Medications on File Prior to Visit  ?Medication Sig Dispense Refill  ?? atorvastatin (LIPITOR) 40 MG tablet Take 1 tablet (40 mg total) by mouth at bedtime. 30 tablet 2  ?? clonazePAM (KLONOPIN) 0.5 MG tablet Take 0.5 mg by mouth 3 (three) times daily.    ?? hydrALAZINE (APRESOLINE) 25 MG tablet Take 1 tablet (25 mg total) by mouth 3 (three) times daily. 270 tablet 1  ?? Iron, Ferrous Sulfate, 325 (65 Fe) MG TABS Take 325 mg by mouth daily. 90 tablet 1  ?? KLOR-CON M10 10 MEQ tablet TAKE 1 TABLET BY MOUTH EVERY DAY 30 tablet 0  ?? labetalol (NORMODYNE) 200 MG tablet Take 1 tablet (200 mg total) by mouth daily. 90 tablet 1  ?? mirtazapine (REMERON) 15 MG tablet Take 15 mg by mouth at bedtime.    ?? ondansetron (ZOFRAN-ODT) 4 MG disintegrating tablet Take 4 mg by mouth as needed.    ?? hydrochlorothiazide (HYDRODIURIL) 25 MG tablet Take 1 tablet (25 mg total) by mouth every morning. 30 tablet 0  ?? losartan (COZAAR) 100 MG tablet  Take 1 tablet (100 mg total) by mouth daily at 10 pm. 30 tablet 0  ? ?No current facility-administered medications on file prior to visit.  ? ?Social History  ? ?Socioeconomic History  ?? Marital status: Divorced  ?  Spouse name: Not on file  ?? Number of children: Not on file  ?? Years of education: Not on file  ?? Highest education level: Not on file  ?Occupational History  ?? Not on file  ?Tobacco Use  ?? Smoking status: Former  ?  Types: Cigarettes  ?? Smokeless tobacco: Not on file  ?Vaping Use  ?? Vaping Use: Never used  ?Substance and Sexual Activity  ?? Alcohol use: Not Currently  ?? Drug use: Never  ?? Sexual activity: Yes  ?Other Topics Concern  ?? Not on file  ?Social History Narrative  ?? Not on file  ? ?Social Determinants of Health  ? ?Financial Resource Strain: Not on file  ?Food Insecurity: Not on file  ?Transportation Needs: Not on file  ?Physical Activity: Not on file  ?Stress: Not on file  ?Social Connections: Not on file  ?Intimate Partner Violence: Not on file  ? ?Family History  ?Problem Relation Age of Onset  ?? Heart attack Mother   ?? Cancer Father   ?? Stroke Father   ?? Stroke Sister   ?? Heart disease Maternal Grandmother   ?? Heart attack  Maternal Grandmother   ?? Heart disease Maternal Grandfather   ?? Stroke Maternal Grandfather   ? ? ? ?OBJECTIVE: ? ?Vitals:  ? 04/19/21 1624 04/19/21 1639  ?BP: (!) 147/90 (!) 143/86  ?Pulse: 79 88  ?Temp: 98.4 ?F (36.9 ?C)   ?TempSrc: Oral   ?SpO2: 95%   ?Weight: 169 lb 9.6 oz (76.9 kg)   ?Height: 4\' 11"  (1.499 m)   ? ? ?Physical Exam ?Vitals reviewed.  ?Constitutional:   ?   Appearance: She is obese.  ?HENT:  ?   Head: Normocephalic.  ?   Right Ear: Tympanic membrane and external ear normal.  ?   Left Ear: Tympanic membrane and external ear normal.  ?   Nose: Nose normal.  ?Eyes:  ?   Extraocular Movements: Extraocular movements intact.  ?   Pupils: Pupils are equal, round, and reactive to light.  ?Cardiovascular:  ?   Rate and Rhythm: Normal rate  and regular rhythm.  ?Pulmonary:  ?   Effort: Pulmonary effort is normal.  ?   Breath sounds: Normal breath sounds.  ?Abdominal:  ?   General: Bowel sounds are normal.  ?   Palpations: Abdomen is soft.  ?Musculoskeletal:     ?   General: Normal range of motion.  ?   Cervical back: Normal range of motion.  ?Skin: ?   General: Skin is warm and dry.  ?Neurological:  ?   Mental Status: She is alert and oriented to person, place, and time.  ?Psychiatric:     ?   Mood and Affect: Mood normal.     ?   Behavior: Behavior normal.     ?   Thought Content: Thought content normal.     ?   Judgment: Judgment normal.  ? ? ?ROS ?Comprehensive ROS Pertinent positive and negative noted in HPI   ?Last 3 Office BP readings: ?BP Readings from Last 3 Encounters:  ?04/20/21 (!) 145/88  ?04/19/21 (!) 143/86  ?03/15/21 128/90  ? ? ?BMET ?   ?Component Value Date/Time  ? NA 138 04/20/2021 2042  ? NA 139 02/16/2021 0803  ? K 3.2 (L) 04/20/2021 2042  ? CL 100 04/20/2021 2042  ? CO2 30 04/20/2021 2042  ? GLUCOSE 100 (H) 04/20/2021 2042  ? BUN 9 04/20/2021 2042  ? BUN 11 02/16/2021 0803  ? CREATININE 0.65 04/20/2021 2042  ? CALCIUM 9.6 04/20/2021 2042  ? GFRNONAA >60 04/20/2021 2042  ? ? ?Renal function: ?Estimated Creatinine Clearance: 73.6 mL/min (by C-G formula based on SCr of 0.65 mg/dL). ? ?Clinical ASCVD: Yes  ?The 10-year ASCVD risk score (Arnett DK, et al., 2019) is: 9.2% ?  Values used to calculate the score: ?    Age: 38 years ?    Sex: Female ?    Is Non-Hispanic African American: Yes ?    Diabetic: No ?    Tobacco smoker: No ?    Systolic Blood Pressure: 145 mmHg ?    Is BP treated: Yes ?    HDL Cholesterol: 44 mg/dL ?    Total Cholesterol: 223 mg/dL ? ?ASCVD risk factors include- 2020 ? ? ?ASSESSMENT & PLAN: ? ?Erin Pollard was seen today for follow-up. ? ?Diagnoses and all orders for this visit: ? ?Essential hypertension ?-Counseled on lifestyle modifications for blood pressure control including reduced dietary sodium, increased  exercise, weight reduction and adequate sleep. Also, educated patient about the risk for cardiovascular events, stroke and heart attack. Also counseled patient about the importance of  medication adherence. If you participate in smoking, it is important to stop using tobacco as this will increase the risks associated with uncontrolled blood pressure.  ?Goal BP:  ?For patients younger than 60: Goal BP < 130/80. ?For patients 60 and older: Goal BP < 140/90. ?For patients with diabetes: Goal BP < 130/80. ?Your most recent BP: 143/86 ?Minimize salt intake. ?Minimize alcohol intake ? ?Class 2 severe obesity due to excess calories with serious comorbidity in adult, unspecified BMI (HCC) ?Obesity is 30-39 indicating an excess in caloric intake or underlining conditions. This may lead to other co-morbidities. Lifestyle modifications of diet and exercise may reduce obesity.   ?  ? ?This note has been created with Education officer, environmental. Any transcriptional errors are unintentional.  ? ?Grayce Sessions, NP ?04/29/2021, 10:01 PM ?  ?

## 2021-04-29 NOTE — Progress Notes (Deleted)
?[facility] ? ? ?Subjective:  ? Erin Pollard is a 51 y.o. female presents for hospital follow up and establish care. Admit date to the hospital was 04/20/21, patient was discharged from the hospital on 04/20/21, patient was admitted for:  ? ?Past Medical History:  ?Diagnosis Date  ? Anxiety   ? Cervical radiculopathy   ? GERD (gastroesophageal reflux disease)   ? Hypertension   ? Myocardial infarction [facility])   ? Panic attack   ?  ? ?Allergies  ?Allergen Reactions  ? Amlodipine Hives  ?  Achy legs and s.o.b ?Achy legs and s.o.b ?  ? Hydrocodone-Acetaminophen Hives and Rash  ? Iodinated Contrast Media Hives  ? Iodine Hives and Rash  ? Hydrocortisone-Iodoquinol   ? Propoxyphene   ? ? ?  ?Current Outpatient Medications on File Prior to Visit  ?Medication Sig Dispense Refill  ? atorvastatin (LIPITOR) 40 MG tablet Take 1 tablet (40 mg total) by mouth at bedtime. 30 tablet 2  ? clonazePAM (KLONOPIN) 0.5 MG tablet Take 0.5 mg by mouth 3 (three) times daily.    ? hydrALAZINE (APRESOLINE) 25 MG tablet Take 1 tablet (25 mg total) by mouth 3 (three) times daily. 270 tablet 1  ? Iron, Ferrous Sulfate, 325 (65 Fe) MG TABS Take 325 mg by mouth daily. 90 tablet 1  ? KLOR-CON M10 10 MEQ tablet TAKE 1 TABLET BY MOUTH EVERY DAY 30 tablet 0  ? labetalol (NORMODYNE) 200 MG tablet Take 1 tablet (200 mg total) by mouth daily. 90 tablet 1  ? mirtazapine (REMERON) 15 MG tablet Take 15 mg by mouth at bedtime.    ? ondansetron (ZOFRAN-ODT) 4 MG disintegrating tablet Take 4 mg by mouth as needed.    ? hydrochlorothiazide (HYDRODIURIL) 25 MG tablet Take 1 tablet (25 mg total) by mouth every morning. 30 tablet 0  ? losartan (COZAAR) 100 MG tablet Take 1 tablet (100 mg total) by mouth daily at 10 pm. 30 tablet 0  ? ?No current facility-administered medications on file prior to visit.  ? ? ? ?Review of System: ?Comprehensive ROS Pertinent positive and negative noted in HPI  ? ? ?Objective:  ?BP (!) 143/86 (BP Location: Right Arm,  Patient Position: Sitting, Cuff Size: Large)   Pulse 88   Temp 98.4 ?F (36.9 ?C) (Oral)   Ht 4\' 11"  (1.499 m)   Wt 169 lb 9.6 oz (76.9 kg)   SpO2 95%   BMI 34.26 kg/m?  ? Weights  ? 04/19/21 1624  ?Weight: 169 lb 9.6 oz (76.9 kg)  ? ? ?Physical Exam: ? ? ?General Appearance: Well nourished, in no apparent distress. ?Eyes: PERRLA, EOMs, conjunctiva no swelling or erythema ?Sinuses: No Frontal/maxillary tenderness ?ENT/Mouth: Ext aud canals clear, TMs without erythema, bulging. No erythema, swelling, or exudate on post pharynx.  Tonsils not swollen or erythematous. Hearing normal.  ?Neck: Supple, thyroid normal.  ?Respiratory: Respiratory effort normal, BS equal bilaterally without rales, rhonchi, wheezing or stridor.  ?Cardio: RRR with no MRGs. Brisk peripheral pulses without edema.  ?Abdomen: Soft, + BS.  Non tender, no guarding, rebound, hernias, masses. ?Lymphatics: Non tender without lymphadenopathy.  ?Musculoskeletal: Full ROM, 5/5 strength, normal gait.  ?Skin: Warm, dry without rashes, lesions, ecchymosis.  ?Neuro: Cranial nerves intact. Normal muscle tone, no cerebellar symptoms. Sensation intact.  ?Psych: Awake and oriented X 3, normal affect, Insight and Judgment appropriate.  ? ? ?Assessment:  ? ?No diagnosis found. ? ?No orders of the defined types were placed in  this encounter. ? ? ?This note has been created with Education officer, environmental. Any transcriptional errors are unintentional.  ? ?Grayce Sessions, NP ?04/29/2021, 9:55 PM ?   ?

## 2021-05-11 ENCOUNTER — Ambulatory Visit: Payer: Self-pay | Admitting: Cardiology

## 2021-05-25 ENCOUNTER — Ambulatory Visit: Payer: Self-pay | Admitting: Cardiology

## 2021-05-25 ENCOUNTER — Ambulatory Visit: Payer: Medicaid Other | Admitting: Cardiology

## 2021-05-25 ENCOUNTER — Encounter: Payer: Self-pay | Admitting: Cardiology

## 2021-05-25 VITALS — BP 139/84 | HR 77 | Temp 97.9°F | Resp 16 | Ht 59.0 in | Wt 168.4 lb

## 2021-05-25 DIAGNOSIS — Z87891 Personal history of nicotine dependence: Secondary | ICD-10-CM

## 2021-05-25 DIAGNOSIS — H0263 Xanthelasma of right eye, unspecified eyelid: Secondary | ICD-10-CM

## 2021-05-25 DIAGNOSIS — E7800 Pure hypercholesterolemia, unspecified: Secondary | ICD-10-CM

## 2021-05-25 DIAGNOSIS — I10 Essential (primary) hypertension: Secondary | ICD-10-CM

## 2021-05-25 DIAGNOSIS — Z8249 Family history of ischemic heart disease and other diseases of the circulatory system: Secondary | ICD-10-CM

## 2021-05-25 NOTE — Progress Notes (Signed)
? ?Date:  05/25/2021  ? ?ID:  Erin Pollard, DOB 05-05-1970, MRN [id number] ? ?PCP:  Grayce Sessions, NP  ?Cardiologist:  Tessa Lerner, DO, [facility] (established care February 01, 2021) ?Former Cardiology Providers: Dr. Aniceto Boss [facility], [place]) ? ?Date: 05/25/21 ?Last Office Visit: 03/15/2021 ? ?Chief Complaint  ?Patient presents with  ? Follow-up  ?  Reevaluation of chest pain. ?Discussed test results and lipid management  ? ? ?HPI  ?Erin Pollard is a 51 y.o. African-American female whose past medical history and cardiovascular risk factors include: Panic attack, anxiety, history of myocardial infarction (04/2015) no intervention, family history of premature CAD, benign essential hypertension, GERD, former smoker, obesity due to excess calories.  ? ?Given her precordial pain she underwent treadmill stress test which was abnormal and subsequently since last visit has undergone exercise nuclear stress test.  Results were independently reviewed with her at today's office visit and noted below for further reference.  Clinically she has not had any chest pain or shortness of breath since improvement in her blood pressures. ? ?Patient was noted to have xanthelasmas on physical examination during her initial consultation back in January 2023.  At that time her total cholesterol was 223 and LDL 158 mg/dL.  She has never been on cholesterol medications prior to that.  I started her on atorvastatin 40 mg p.o. nightly with intentions of up titration of medical therapy based on her response.  However, due to financial constraints she has not had any follow-up labs to reevaluate lipids or LFTs.  The importance of this was discussed again at today's visit.  Clinically her xanthelasmas have improved and are less noticeable but still present.  She is very happy with the progress.  ? ?Family history of premature CAD with mother having a myocardial infarction at the age of 52 and passed. ? ?Home blood pressure logs  independently reviewed and visual estimation systolic blood pressures between 130 mmHg. ? ?On the last echocardiogram she was noted to have a 3.3 x 4 cm liver cyst and a dedicated ultrasound of the abdomen was recommended.  This is still pending due to insurance coverage and cost. ? ?FUNCTIONAL STATUS: ?No structured exercise program or daily routine.  ? ?ALLERGIES: ?Allergies  ?Allergen Reactions  ? Amlodipine Hives  ?  Achy legs and s.o.b ?Achy legs and s.o.b ?  ? Hydrocodone-Acetaminophen Hives and Rash  ? Iodinated Contrast Media Hives  ? Iodine Hives and Rash  ? Hydrocortisone-Iodoquinol   ? Propoxyphene   ? ? ?MEDICATION LIST PRIOR TO VISIT: ?Current Meds  ?Medication Sig  ? atorvastatin (LIPITOR) 40 MG tablet Take 1 tablet (40 mg total) by mouth at bedtime.  ? clonazePAM (KLONOPIN) 0.5 MG tablet Take 0.5 mg by mouth 3 (three) times daily.  ? docusate sodium (COLACE) 100 MG capsule Take 1 capsule (100 mg total) by mouth every 12 (twelve) hours. Cut capsule and instill liquid into affected ear 2x daily for 2 weeks.  ? hydrALAZINE (APRESOLINE) 25 MG tablet Take 1 tablet (25 mg total) by mouth 3 (three) times daily.  ? hydrochlorothiazide (HYDRODIURIL) 25 MG tablet Take 1 tablet (25 mg total) by mouth every morning.  ? Iron, Ferrous Sulfate, 325 (65 Fe) MG TABS Take 325 mg by mouth daily.  ? KLOR-CON M10 10 MEQ tablet TAKE 1 TABLET BY MOUTH EVERY DAY  ? labetalol (NORMODYNE) 200 MG tablet Take 1 tablet (200 mg total) by mouth daily.  ? losartan (COZAAR) 100 MG tablet Take 1  tablet (100 mg total) by mouth daily at 10 pm.  ? mirtazapine (REMERON) 15 MG tablet Take 15 mg by mouth at bedtime.  ? omeprazole (PRILOSEC) 20 MG capsule Take 1 capsule (20 mg total) by mouth daily.  ? ondansetron (ZOFRAN-ODT) 4 MG disintegrating tablet Take 4 mg by mouth as needed.  ? sucralfate (CARAFATE) 1 g tablet Take 1 tablet (1 g total) by mouth 4 (four) times daily -  with meals and at bedtime.  ?  ? ?PAST MEDICAL HISTORY: ?Past  Medical History:  ?Diagnosis Date  ? Anxiety   ? Cervical radiculopathy   ? GERD (gastroesophageal reflux disease)   ? Hypertension   ? Myocardial infarction [facility](HCC)   ? Panic attack   ? ? ?PAST SURGICAL HISTORY: ?Past Surgical History:  ?Procedure Laterality Date  ? LEFT HEART CATH    ? TUBAL LIGATION    ? ? ?FAMILY HISTORY: ?The patient family history includes Cancer in her father; Heart attack in her maternal grandmother and mother; Heart disease in her maternal grandfather and maternal grandmother; Stroke in her father, maternal grandfather, and sister. ? ?SOCIAL HISTORY:  ?The patient  reports that she has quit smoking. Her smoking use included cigarettes. She does not have any smokeless tobacco history on file. She reports that she does not currently use alcohol. She reports that she does not use drugs. ? ?REVIEW OF SYSTEMS: ?Review of Systems  ?Cardiovascular:  Negative for chest pain, dyspnea on exertion, leg swelling, orthopnea, palpitations, paroxysmal nocturnal dyspnea and syncope.  ?Respiratory:  Negative for shortness of breath.   ? ?PHYSICAL EXAM: ? ?  05/25/2021  ?  1:49 PM 04/20/2021  ? 11:09 PM 04/20/2021  ?  8:00 PM  ?Vitals with BMI  ?Height 4\' 11"     ?Weight 168 lbs 6 oz    ?BMI 33.99    ?Systolic 139 145 [id number]  ?Diastolic 84 88 78  ?Pulse 77 80 72  ? ? ?CONSTITUTIONAL: Well-developed and well-nourished. No acute distress.  ?SKIN: Skin is warm and dry. No rash noted. No cyanosis. No pallor. No jaundice ?HEAD: Normocephalic and atraumatic.  ?EYES: No scleral icterus, xanthomas ?MOUTH/THROAT: Moist oral membranes.  ?NECK: No JVD present. No thyromegaly noted. No carotid bruits  ?LYMPHATIC: No visible cervical adenopathy.  ?CHEST Normal respiratory effort. No intercostal retractions  ?LUNGS: Clear to auscultation bilaterally.  No stridor. No wheezes. No rales.  ?CARDIOVASCULAR: Regular rate and rhythm, positive S1-S2, no murmurs rubs or gallops appreciated. ?ABDOMINAL: Obese, soft, nontender, nondistended,  positive bowel sounds in all 4 quadrants, no apparent ascites.  ?EXTREMITIES: No peripheral edema, warm to touch, 2+ bilateral DP and PT pulses ?HEMATOLOGIC: No significant bruising ?NEUROLOGIC: Oriented to person, place, and time. Nonfocal. Normal muscle tone.  ?PSYCHIATRIC: Normal mood and affect. Normal behavior. Cooperative ? ?RADIOLOGY DATABASE: ?Chest x-ray 11/13/2020: ?No active cardiopulmonary disease. ? ?CARDIAC DATABASE: ?EKG: ?02/01/2021: NSR, 70 bpm, without underlying ischemia or injury pattern. ? ?Echocardiogram: ?[facility] available in Care Everywhere: ?LVEF 60 to 65%, normal diastolic filling pattern, normal right ventricular size and function, normal left atrial size, no significant valvular heart disease. ? ?02/23/2021: ?Normal LV systolic function with visual EF 60-65%. Left ventricle cavity is normal in size. Normal left ventricular wall thickness. Normal global wall motion. Normal diastolic filling pattern, normal LAP. ?No significant valvular heart disease. ?Anechoic structure within the liver parenchyma measuring 3.34 x 4.05cm likely a cyst, consider dedicated ultrasound of abdomen if clinically indicated.  ?Compared to outside study  04/2016 no significant change.  ? ?Stress Testing: ?Exercise treadmill stress test 02/23/2021: ?Functional status: Fair. ?Chest pain: No. ?Reason for stopping exercise: Dyspnea, patient request. ?Hypertensive response to exercise: No. ?Exercise time 5 minutes 59 seconds on Bruce protocol, achieved 7.05 METS, 88% of age-predicted maximum heart rate (APMHR).  ?Stress ECG positive for ischemia.  ?Intermediate risk study. ?Consider further cardiac work up if clinically indicated. Clinical correlation required.  ? ?Exercise nuclear stress test 04/02/2021: ?Normal ECG stress. The patient exercised for 5 minutes and 0 seconds of a Bruce protocol, achieving approximately 7.05 METs. Exercise capacity reduced. The blood pressure response was  normal. ?Myocardial perfusion is normal. ?Overall LV systolic function is normal without regional wall motion abnormalities. Stress LV EF: 69%.  ?No previous exam available for comparison. Low risk. ? ?Heart Cathete

## 2021-05-29 ENCOUNTER — Ambulatory Visit: Payer: Self-pay | Admitting: Cardiology

## 2021-06-02 ENCOUNTER — Other Ambulatory Visit (INDEPENDENT_AMBULATORY_CARE_PROVIDER_SITE_OTHER): Payer: Self-pay | Admitting: Primary Care

## 2021-06-04 NOTE — Telephone Encounter (Signed)
Dosage increased by cardiology on 02/01/21, will refuse this request due to this. ? ? ?Requested Prescriptions  ?Pending Prescriptions Disp Refills  ?? losartan (COZAAR) 50 MG tablet [Pharmacy Med Name: LOSARTAN POTASSIUM 50 MG TAB] 90 tablet 1  ?  Sig: TAKE 1 TABLET BY MOUTH EVERY DAY  ?  ? Cardiovascular:  Angiotensin Receptor Blockers Failed - 06/02/2021  8:31 AM  ?  ?  Failed - K in normal range and within 180 days  ?  Potassium  ?Date Value Ref Range Status  ?04/20/2021 3.2 (L) 3.5 - 5.1 mmol/L Final  ?   ?  ?  Passed - Cr in normal range and within 180 days  ?  Creatinine, Ser  ?Date Value Ref Range Status  ?04/20/2021 0.65 0.44 - 1.00 mg/dL Final  ?   ?  ?  Passed - Patient is not pregnant  ?  ?  Passed - Last BP in normal range  ?  BP Readings from Last 1 Encounters:  ?05/25/21 139/84  ?   ?  ?  Passed - Valid encounter within last 6 months  ?  Recent Outpatient Visits   ?      ? 1 month ago Essential hypertension  ? [facility] Grayce Sessions, NP  ? 4 months ago Screening for diabetes mellitus  ? [facility] Grayce Sessions, NP  ? 5 months ago Encounter to establish care  ? [facility] Grayce Sessions, NP  ?  ?  ?Future Appointments   ?        ? In 5 months Tessa Lerner, DO [facility]  ?  ? ?  ?  ?  ? ? ?

## 2021-06-05 ENCOUNTER — Telehealth (INDEPENDENT_AMBULATORY_CARE_PROVIDER_SITE_OTHER): Payer: Self-pay | Admitting: Primary Care

## 2021-06-05 ENCOUNTER — Ambulatory Visit (INDEPENDENT_AMBULATORY_CARE_PROVIDER_SITE_OTHER): Payer: Self-pay | Admitting: *Deleted

## 2021-06-05 NOTE — Telephone Encounter (Signed)
Medication Refill - Medication: omeprazole (PRILOSEC) 20 MG capsule ? ?Pt has symptoms, upset stomach  ? ?Has the patient contacted their pharmacy? Yes.   ?(Agent: If no, request that the patient contact the pharmacy for the refill. If patient does not wish to contact the pharmacy document the reason why and proceed with request.) ?(Agent: If yes, when and what did the pharmacy advise?) ? ?Preferred Pharmacy (with phone number or street name):  ?[id number] [place], [place] - [address]  ?[address] [place] [address]  ?Phone: [phone] Fax: [phone]  ? ?Has the patient been seen for an appointment in the last year OR does the patient have an upcoming appointment? Yes.   ? ?Agent: Please be advised that RX refills may take up to 3 business days. We ask that you follow-up with your pharmacy. ? ?

## 2021-06-05 NOTE — Telephone Encounter (Signed)
See triage encounter for triage of symptoms (upset stomach).  ?

## 2021-06-05 NOTE — Telephone Encounter (Signed)
Request routed to PCP ?

## 2021-06-05 NOTE — Telephone Encounter (Signed)
Summary: Rx request, has symptoms  ? Medication Refill - Medication: omeprazole (PRILOSEC) 20 MG capsule  ? ?Pt has symptoms, upset stomach  ? ?Has the patient contacted their pharmacy? Yes.    ?(Agent: If no, request that the patient contact the pharmacy for the refill. If patient does not wish to contact the pharmacy document the reason why and proceed with request.)  ?(Agent: If yes, when and what did the pharmacy advise?)  ? ?Preferred Pharmacy (with phone number or street name):  ?[id number] [place], [place] - [address]  ?[address] [place] [address]  ?Phone: [phone] Fax: [phone]  ? ?Has the patient been seen for an appointment in the last year OR does the patient have an upcoming appointment? Yes.    ? ?Agent: Please be advised that RX refills may take up to 3 business days. We ask that you follow-up with your pharmacy.   ?  ? ? ?Called patient to review medication request. Patient reports she is "ok" just needs Rx for omprazole 20 mg she received while in hospital. Reports she was given 30 supply at discharge and has seen PCP since. Requesting PCP to prescribe omeprazole Prilosec 20 mg . C/o gastric pain since Saturday after eating "crab cakes" and run out of medication. No other sx reported.  Please advise. Offered appt and patient reports she only needs Rx prescribed.  ? ? ? ? ?Reason for Disposition ? [1] Prescription refill request for NON-ESSENTIAL medicine (i.e., no harm to patient if med not taken) AND [2] triager unable to refill per department policy ? ?Answer Assessment - Initial Assessment Questions ?1. DRUG NAME: "What medicine do you need to have refilled?" ?    Omeprazole prilosec 20 mg  ?2. REFILLS REMAINING: "How many refills are remaining?" (Note: The label on the medicine or pill bottle will show how many refills are remaining. If there are no refills remaining, then a renewal may be needed.) ?    none ?3. EXPIRATION DATE: "What is the expiration  date?" (Note: The label states when the prescription will expire, and thus can no longer be refilled.) ?    na ?4. PRESCRIBING HCP: "Who prescribed it?" Reason: If prescribed by specialist, call should be referred to that group. ?    Prescribed while in hospital  ?5. SYMPTOMS: "Do you have any symptoms?" ?    Yes since Saturday after eating "crab cakes" ?6. PREGNANCY: "Is there any chance that you are pregnant?" "When was your last menstrual period?" ?    na ? ?Protocols used: Medication Refill and Renewal Call-A-AH ? ?

## 2021-06-06 ENCOUNTER — Other Ambulatory Visit (INDEPENDENT_AMBULATORY_CARE_PROVIDER_SITE_OTHER): Payer: Self-pay | Admitting: Primary Care

## 2021-06-06 DIAGNOSIS — E876 Hypokalemia: Secondary | ICD-10-CM

## 2021-06-07 ENCOUNTER — Other Ambulatory Visit (INDEPENDENT_AMBULATORY_CARE_PROVIDER_SITE_OTHER): Payer: Self-pay | Admitting: Primary Care

## 2021-06-07 DIAGNOSIS — K2100 Gastro-esophageal reflux disease with esophagitis, without bleeding: Secondary | ICD-10-CM

## 2021-06-07 MED ORDER — OMEPRAZOLE 20 MG PO CPDR: 20.0000 mg | DELAYED_RELEASE_CAPSULE | Freq: Every day | ORAL | 0 refills | Status: DC

## 2021-06-07 NOTE — Telephone Encounter (Signed)
Requested Prescriptions  Pending Prescriptions Disp Refills  . KLOR-CON M10 10 MEQ tablet [Pharmacy Med Name: KLOR-CON M10 TABLET] 90 tablet 0    Sig: TAKE 1 TABLET BY MOUTH EVERY DAY     Endocrinology:  Minerals - Potassium Supplementation Failed - 06/06/2021  6:34 PM      Failed - K in normal range and within 360 days    Potassium  Date Value Ref Range Status  04/20/2021 3.2 (L) 3.5 - 5.1 mmol/L Final         Passed - Cr in normal range and within 360 days    Creatinine, Ser  Date Value Ref Range Status  04/20/2021 0.65 0.44 - 1.00 mg/dL Final         Passed - Valid encounter within last 12 months    Recent Outpatient Visits          1 month ago Essential hypertension   [facility] Grayce Sessions, NP   4 months ago Screening for diabetes mellitus   [facility] Grayce Sessions, NP   5 months ago Encounter to establish care   [facility] Grayce Sessions, NP      Future Appointments            In 5 months Odis Hollingshead, [place], DO [facility]

## 2021-06-09 ENCOUNTER — Emergency Department (HOSPITAL_COMMUNITY)
Admission: EM | Admit: 2021-06-09 | Discharge: 2021-06-10 | Disposition: A | Discharge status: Home / Self Care | Payer: Self-pay | Attending: Emergency Medicine | Admitting: Emergency Medicine

## 2021-06-09 ENCOUNTER — Encounter (HOSPITAL_COMMUNITY): Payer: Self-pay | Admitting: Emergency Medicine

## 2021-06-09 ENCOUNTER — Emergency Department (HOSPITAL_COMMUNITY): Payer: Self-pay

## 2021-06-09 DIAGNOSIS — R251 Tremor, unspecified: Secondary | ICD-10-CM | POA: Insufficient documentation

## 2021-06-09 DIAGNOSIS — R030 Elevated blood-pressure reading, without diagnosis of hypertension: Secondary | ICD-10-CM | POA: Insufficient documentation

## 2021-06-09 DIAGNOSIS — R0789 Other chest pain: Secondary | ICD-10-CM | POA: Insufficient documentation

## 2021-06-09 DIAGNOSIS — F419 Anxiety disorder, unspecified: Secondary | ICD-10-CM | POA: Insufficient documentation

## 2021-06-09 NOTE — ED Triage Notes (Signed)
BIB EMS from home.  Had change to her BP meds on Thurs.  Has not been able to control her BP since then. Burning to left side of chest with tightness. 190/94 with EMS.

## 2021-06-09 NOTE — ED Provider Triage Note (Signed)
Emergency Medicine Provider Triage Evaluation Note  Erin Pollard , a 51 y.o. female  was evaluated in triage.  Pt complains of chest pain and hypertension.  Patient states that she was seen by her cardiologist on Thursday and was started on atorvastatin and potassium.  She states that atorvastatin is her only new medication.  She states that she started taking it yesterday and noticed that her blood pressure was elevated.  She states that it was up to 170s systolic.  States that her blood pressure is normally in the 130s systolic.  She states that since then she has had intermittent episodes of left-sided chest tightness.  Nonradiating.  She denies shortness of breath or palpitations.  Review of Systems  Positive:  Negative:   Physical Exam  BP (!) 163/87 (BP Location: Left Arm)   Pulse 64   Temp 98.4 F (36.9 C) (Oral)   Resp 18   Ht 5\' 6"  (1.676 m)   Wt 75.8 kg   SpO2 95%   BMI 26.95 kg/m  Gen:   Awake, no distress   Resp:  Normal effort  MSK:   Moves extremities without difficulty  Other:    Medical Decision Making  Medically screening exam initiated at 11:31 PM.  Appropriate orders placed.  Erin Pollard was informed that the remainder of the evaluation will be completed by another provider, this initial triage assessment does not replace that evaluation, and the importance of remaining in the ED until their evaluation is complete.     Excell Seltzer, PA-C 06/09/21 2332

## 2021-06-10 LAB — COMPREHENSIVE METABOLIC PANEL
ALT: 16 U/L (ref 0–44)
AST: 16 U/L (ref 15–41)
Albumin: 4.2 g/dL (ref 3.5–5.0)
Alkaline Phosphatase: 75 U/L (ref 38–126)
Anion gap: 7 (ref 5–15)
BUN: 9 mg/dL (ref 6–20)
CO2: 29 mmol/L (ref 22–32)
Calcium: 9.4 mg/dL (ref 8.9–10.3)
Chloride: 104 mmol/L (ref 98–111)
Creatinine, Ser: 0.6 mg/dL (ref 0.44–1.00)
GFR, Estimated: >60 mL/min (ref >60)
Glucose, Bld: 89 mg/dL (ref 70–99)
Potassium: 3.3 mmol/L — ABNORMAL LOW (ref 3.5–5.1)
Sodium: 140 mmol/L (ref 135–145)
Total Bilirubin: 0.5 mg/dL (ref 0.3–1.2)
Total Protein: 8 g/dL (ref 6.5–8.1)

## 2021-06-10 LAB — CBC
HCT: 38.4 % (ref 36.0–46.0)
Hemoglobin: 12.3 g/dL (ref 12.0–15.0)
MCH: 25.5 pg — ABNORMAL LOW (ref 26.0–34.0)
MCHC: 32 g/dL (ref 30.0–36.0)
MCV: 79.7 fL — ABNORMAL LOW (ref 80.0–100.0)
Platelets: 313 10*3/uL (ref 150–400)
RBC: 4.82 MIL/uL (ref 3.87–5.11)
RDW: 15.9 % — ABNORMAL HIGH (ref 11.5–15.5)
WBC: 5.7 10*3/uL (ref 4.0–10.5)
nRBC: 0 % (ref 0.0–0.2)

## 2021-06-10 LAB — I-STAT BETA HCG BLOOD, ED (MC, WL, AP ONLY): I-stat hCG, quantitative: <5 m[IU]/mL (ref <5)

## 2021-06-10 LAB — TROPONIN I (HIGH SENSITIVITY): Troponin I (High Sensitivity): 3 ng/L (ref <18)

## 2021-06-10 MED ORDER — POTASSIUM CHLORIDE CRYS ER 20 MEQ PO TBCR
40.0000 meq | EXTENDED_RELEASE_TABLET | Freq: Once | ORAL | Status: AC
  Administered 2021-06-10: 40 meq via ORAL
  Filled 2021-06-10: qty 2

## 2021-06-10 MED ORDER — HYDRALAZINE HCL 25 MG PO TABS
50.0000 mg | ORAL_TABLET | Freq: Once | ORAL | Status: AC
  Administered 2021-06-10: 50 mg via ORAL
  Filled 2021-06-10: qty 2

## 2021-06-10 NOTE — Discharge Instructions (Signed)
No certain cause for your elevated blood pressures was found tonight.  I recommend that you discuss this with your medical team.   Your potassium was slightly low tonight (3.3), you were given some extra potassium vitamins.  If you have worsening chest pain or any other new or worsening symptoms, please return to the ER or see your doctor.

## 2021-06-10 NOTE — ED Provider Notes (Signed)
[facility]-EMERGENCY DEPT [facility] Emergency Department Provider Note MRN:  [id number]  Arrival date & time: 06/10/21     Chief Complaint   Hypertension   History of Present Illness   Erin Pollard is a 51 y.o. year-old female presents to the ED with chief complaint of elevated blood pressure.  She states that she recently started atorvastatin, and states that her blood pressure has been high since then.  She states that tonight she felt very anxious and also had some left-sided chest tightness which was associated with anxiety and "shakiness" at home.  She states that this feels improved.  She states that her blood pressure remains higher than normal for her.  She denies any new changes with her blood pressure medicine.  Denies any other associated symptoms tonight..  History provided by patient.   Review of Systems  Pertinent review of systems noted in HPI.    Physical Exam   Vitals:   06/10/21 0200 06/10/21 0230  BP: (!) 170/87 (!) 157/71  Pulse: 60 62  Resp: 16   Temp:    SpO2: 100% 97%    CONSTITUTIONAL:  well-appearing, NAD NEURO:  Alert and oriented x 3, CN 3-12 grossly intact EYES:  eyes equal and reactive ENT/NECK:  Supple, no stridor  CARDIO:  normal rate, regular rhythm, appears well-perfused  PULM:  No respiratory distress,  GI/GU:  non-distended,  MSK/SPINE:  No gross deformities, no edema, moves all extremities  SKIN:  no rash, atraumatic   *Additional and/or pertinent findings included in MDM below  Diagnostic and Interventional Summary    EKG Interpretation  Date/Time:  Saturday Jun 09 2021 23:26:07 EDT Ventricular Rate:  63 PR Interval:  140 QRS Duration: 83 QT Interval:  408 QTC Calculation: 418 R Axis:   27 Text Interpretation: Sinus rhythm Normal ECG Confirmed by Geoffery Lyons ([id number]) on 06/10/2021 2:08:54 AM       Labs Reviewed  CBC - Abnormal; Notable for the following components:      Result Value   MCV 79.7 (*)    MCH 25.5 (*)     RDW 15.9 (*)    All other components within normal limits  COMPREHENSIVE METABOLIC PANEL - Abnormal; Notable for the following components:   Potassium 3.3 (*)    All other components within normal limits  I-STAT BETA HCG BLOOD, ED ([facility], [facility], [facility] ONLY)  TROPONIN I (HIGH SENSITIVITY)    DG Chest 2 View  Final Result      Medications  potassium chloride SA (KLOR-CON M) CR tablet 40 mEq (has no administration in time range)  hydrALAZINE (APRESOLINE) tablet 50 mg (50 mg Oral Given 06/10/21 0148)     Procedures  /  Critical Care Procedures  ED Course and Medical Decision Making  I have reviewed the triage vital signs, the nursing notes, and pertinent available records from the EMR.  Social Determinants Affecting Complexity of Care: Patient .   ED Course:   Patient here with elevated BP.  Top differential diagnoses include anxiety, medication non-compliance, poor response to BP meds. Medical Decision Making Patient here with elevated BP reading and some left sided chest pain that she associated with being very anxious and shaky tonight.  We discussed that her elevated BP could be stress induced.  I doubt ACS given normal trop and reassuring EKG.    Problems Addressed: Elevated blood pressure reading: complicated acute illness or injury  Amount and/or Complexity of Data Reviewed Labs: ordered.    Details: trop  is 3, doubt ACS. K is 3.3, repleted with PO K-dur. Radiology: ordered and independent interpretation performed.    Details: no opacity or effusion ECG/medicine tests: ordered and independent interpretation performed.    Details: no ischemic changes.  Risk Prescription drug management.     Consultants: No consultations were needed in caring for this patient.   Treatment and Plan: Emergency department workup does not suggest an emergent condition requiring admission or immediate intervention beyond  what has been performed at this time. The patient is safe for  discharge and has  been instructed to return immediately for worsening symptoms, change in  symptoms or any other concerns    Final Clinical Impressions(s) / ED Diagnoses     ICD-10-CM   1. Elevated blood pressure reading  R03.0       ED Discharge Orders     None         Discharge Instructions Discussed with and Provided to Patient:     Discharge Instructions      No certain cause for your elevated blood pressures was found tonight.  I recommend that you discuss this with your medical team.   Your potassium was slightly low tonight (3.3), you were given some extra potassium vitamins.  If you have worsening chest pain or any other new or worsening symptoms, please return to the ER or see your doctor.       Roxy Horseman, PA-C 06/10/21 0246    Geoffery Lyons, MD 06/10/21 [phone]

## 2021-06-27 ENCOUNTER — Ambulatory Visit (INDEPENDENT_AMBULATORY_CARE_PROVIDER_SITE_OTHER): Payer: Commercial Managed Care - HMO | Admitting: Physician Assistant

## 2021-06-27 ENCOUNTER — Encounter (HOSPITAL_COMMUNITY): Payer: Self-pay | Admitting: Physician Assistant

## 2021-06-27 VITALS — BP 186/81 | HR 64 | Temp 98.0°F | Ht 60.0 in | Wt 166.0 lb

## 2021-06-27 DIAGNOSIS — F410 Panic disorder [episodic paroxysmal anxiety] without agoraphobia: Secondary | ICD-10-CM

## 2021-06-27 DIAGNOSIS — F411 Generalized anxiety disorder: Secondary | ICD-10-CM

## 2021-06-27 DIAGNOSIS — G479 Sleep disorder, unspecified: Secondary | ICD-10-CM

## 2021-06-27 MED ORDER — MIRTAZAPINE 15 MG PO TABS: 15.0000 mg | ORAL_TABLET | Freq: Every day | ORAL | 2 refills | Status: DC

## 2021-06-27 MED ORDER — CLONAZEPAM 0.5 MG PO TABS: 0.5000 mg | ORAL_TABLET | Freq: Three times a day (TID) | ORAL | 1 refills | Status: DC
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
## 2021-06-29 ENCOUNTER — Other Ambulatory Visit (INDEPENDENT_AMBULATORY_CARE_PROVIDER_SITE_OTHER): Payer: Self-pay | Admitting: Primary Care

## 2021-06-29 NOTE — Telephone Encounter (Signed)
Requested medication (s) are due for refill today - unsure  Requested medication (s) are on the active medication list -yes  Future visit scheduled -no  Last refill: historical  Notes to clinic: Medication has 2 dosing listed in current meidctaion list- sent for provider review for which dose is current   Requested Prescriptions  Pending Prescriptions Disp Refills   losartan (COZAAR) 50 MG tablet [Pharmacy Med Name: LOSARTAN POTASSIUM 50 MG TAB] 90 tablet 1    Sig: TAKE 1 TABLET BY MOUTH EVERY DAY     Cardiovascular:  Angiotensin Receptor Blockers Failed - 06/29/2021 12:31 PM      Failed - K in normal range and within 180 days    Potassium  Date Value Ref Range Status  06/10/2021 3.3 (L) 3.5 - 5.1 mmol/L Final         Failed - Last BP in normal range    BP Readings from Last 1 Encounters:  06/10/21 (!) 157/71         Passed - Cr in normal range and within 180 days    Creatinine, Ser  Date Value Ref Range Status  06/10/2021 0.60 0.44 - 1.00 mg/dL Final         Passed - Patient is not pregnant      Passed - Valid encounter within last 6 months    Recent Outpatient Visits           2 months ago Essential hypertension   [facility] Grayce Sessions, NP   5 months ago Screening for diabetes mellitus   [facility] Grayce Sessions, NP   6 months ago Encounter to establish care   [facility] Grayce Sessions, NP       Future Appointments             In 4 months Tessa Lerner, DO [facility]               Requested Prescriptions  Pending Prescriptions Disp Refills   losartan (COZAAR) 50 MG tablet [Pharmacy Med Name: LOSARTAN POTASSIUM 50 MG TAB] 90 tablet 1    Sig: TAKE 1 TABLET BY MOUTH EVERY DAY     Cardiovascular:  Angiotensin Receptor Blockers Failed - 06/29/2021 12:31 PM      Failed - K in normal range and within 180 days    Potassium  Date Value Ref Range  Status  06/10/2021 3.3 (L) 3.5 - 5.1 mmol/L Final         Failed - Last BP in normal range    BP Readings from Last 1 Encounters:  06/10/21 (!) 157/71         Passed - Cr in normal range and within 180 days    Creatinine, Ser  Date Value Ref Range Status  06/10/2021 0.60 0.44 - 1.00 mg/dL Final         Passed - Patient is not pregnant      Passed - Valid encounter within last 6 months    Recent Outpatient Visits           2 months ago Essential hypertension   [facility] Grayce Sessions, NP   5 months ago Screening for diabetes mellitus   [facility] Grayce Sessions, NP   6 months ago Encounter to establish care   [facility] Grayce Sessions, NP  Future Appointments             In 4 months Tolia, [place], DO [facility]

## 2021-06-30 ENCOUNTER — Encounter (HOSPITAL_COMMUNITY): Payer: Self-pay | Admitting: Physician Assistant

## 2021-06-30 ENCOUNTER — Other Ambulatory Visit (INDEPENDENT_AMBULATORY_CARE_PROVIDER_SITE_OTHER): Payer: Self-pay | Admitting: Primary Care

## 2021-06-30 DIAGNOSIS — I10 Essential (primary) hypertension: Secondary | ICD-10-CM

## 2021-07-02 NOTE — Telephone Encounter (Signed)
Routed to PCP 

## 2021-07-04 ENCOUNTER — Other Ambulatory Visit: Payer: Self-pay

## 2021-07-04 ENCOUNTER — Other Ambulatory Visit: Payer: Self-pay | Admitting: Cardiology

## 2021-07-04 DIAGNOSIS — I10 Essential (primary) hypertension: Secondary | ICD-10-CM

## 2021-07-04 MED ORDER — HYDRALAZINE HCL 25 MG PO TABS: 25.0000 mg | ORAL_TABLET | Freq: Three times a day (TID) | ORAL | 5 refills | Status: DC

## 2021-07-07 ENCOUNTER — Other Ambulatory Visit (INDEPENDENT_AMBULATORY_CARE_PROVIDER_SITE_OTHER): Payer: Self-pay | Admitting: Primary Care

## 2021-07-07 DIAGNOSIS — I10 Essential (primary) hypertension: Secondary | ICD-10-CM

## 2021-07-09 NOTE — Telephone Encounter (Signed)
Needs appt

## 2021-07-09 NOTE — Telephone Encounter (Signed)
Sent to PCP ?

## 2021-07-11 ENCOUNTER — Telehealth: Payer: Self-pay

## 2021-07-11 ENCOUNTER — Other Ambulatory Visit: Payer: Self-pay | Admitting: Cardiology

## 2021-07-11 ENCOUNTER — Other Ambulatory Visit (HOSPITAL_COMMUNITY): Payer: Self-pay | Admitting: Physician Assistant

## 2021-07-11 DIAGNOSIS — F410 Panic disorder [episodic paroxysmal anxiety] without agoraphobia: Secondary | ICD-10-CM

## 2021-07-11 DIAGNOSIS — I10 Essential (primary) hypertension: Secondary | ICD-10-CM

## 2021-07-11 MED ORDER — LOSARTAN POTASSIUM 50 MG PO TABS: 50.0000 mg | ORAL_TABLET | Freq: Every day | ORAL | 0 refills | Status: DC

## 2021-07-11 NOTE — Telephone Encounter (Signed)
Patient requesting to reduce her losartan dose to 50mg  po qday.  Script sent.  Please have her check her BP twice daily. She recently had an ED visit for elevated BP.   ST

## 2021-07-27 ENCOUNTER — Telehealth (HOSPITAL_COMMUNITY): Payer: Self-pay | Admitting: *Deleted

## 2021-07-27 NOTE — Telephone Encounter (Signed)
Erin Pollard is OUT of Office sending Refill Request to Dr B Doyne Keel  Dispense quantity : 30 not adding  up should be # 90  clonazePAM (KLONOPIN) 0.5 MG tablet Take 1 tablet (0.5 mg total) by mouth 3 (three) times daily., Starting Sat 07/21/2021, Normal  Dispense : 30

## 2021-07-29 ENCOUNTER — Encounter (HOSPITAL_COMMUNITY): Payer: Self-pay

## 2021-07-29 ENCOUNTER — Ambulatory Visit (HOSPITAL_COMMUNITY)
Admission: EM | Admit: 2021-07-29 | Discharge: 2021-07-29 | Disposition: A | Discharge status: Home / Self Care | Payer: Commercial Managed Care - HMO | Attending: Internal Medicine | Admitting: Internal Medicine

## 2021-07-29 DIAGNOSIS — Y93H9 Activity, other involving exterior property and land maintenance, building and construction: Secondary | ICD-10-CM | POA: Diagnosis not present

## 2021-07-29 DIAGNOSIS — R110 Nausea: Secondary | ICD-10-CM | POA: Insufficient documentation

## 2021-07-29 DIAGNOSIS — T148XXA Other injury of unspecified body region, initial encounter: Secondary | ICD-10-CM | POA: Insufficient documentation

## 2021-07-29 DIAGNOSIS — Z20822 Contact with and (suspected) exposure to covid-19: Secondary | ICD-10-CM | POA: Diagnosis not present

## 2021-07-29 DIAGNOSIS — M62838 Other muscle spasm: Secondary | ICD-10-CM | POA: Diagnosis not present

## 2021-07-29 LAB — POCT URINALYSIS DIPSTICK, ED / UC
Bilirubin Urine: NEGATIVE
Glucose, UA: NEGATIVE mg/dL
Hgb urine dipstick: NEGATIVE
Leukocytes,Ua: NEGATIVE
Nitrite: NEGATIVE
Protein, ur: NEGATIVE mg/dL
Specific Gravity, Urine: 1.015 (ref 1.005–1.030)
Urobilinogen, UA: 4 mg/dL — ABNORMAL HIGH (ref 0.0–1.0)
pH: 7 (ref 5.0–8.0)

## 2021-07-29 MED ORDER — METHOCARBAMOL 500 MG PO TABS: 500.0000 mg | ORAL_TABLET | Freq: Two times a day (BID) | ORAL | 0 refills | Status: DC

## 2021-07-29 MED ORDER — NAPROXEN 500 MG PO TABS: 500.0000 mg | ORAL_TABLET | Freq: Two times a day (BID) | ORAL | 0 refills | Status: DC

## 2021-07-29 MED ORDER — ONDANSETRON 4 MG PO TBDP: 4.0000 mg | ORAL_TABLET | Freq: Three times a day (TID) | ORAL | 0 refills | Status: DC | PRN

## 2021-07-29 NOTE — ED Triage Notes (Signed)
Nausea and body pain for the last few days. Has been able to eat and no emesis. Worse today.   Patient states it feels like her body is on fire, states everything hurts to the touch. Patient states she has been home and only around her 53 year old grand daughter.   Patient is pre menopausal. Last period in may for a few days and no period for 6 months before that.   Patient having low back pain and abdominal cramping but no cycle.

## 2021-07-29 NOTE — Discharge Instructions (Addendum)
Take naproxen twice daily for the next 2 to 3 days for your body aches, then as needed.  Take this medicine with food to avoid stomach upset.  Do not take any other NSAID containing medications while taking naproxen.  You may take Robaxin muscle relaxer at nighttime for increased comfort.  Do not take Robaxin while driving, working, or drinking alcohol as it can make you sleepy.  I believe that your body aches and muscle pain is due to muscle strain from disassembling the pool approximately 3 to 4 days ago.  I believe that you overexerted yourself and need to rest your muscles.  Continue to drink plenty of water and stay well-hydrated.  You may take Zofran every 8 hours as needed for any nausea you may experience.  Your urine looks normal today.  If you develop any new or worsening symptoms or do not improve in the next 2 to 3 days, please return.  If your symptoms are severe, please go to the emergency room.  Follow-up with your primary care provider for further evaluation and management of your symptoms as well as ongoing wellness visits.  I hope you feel better!

## 2021-07-29 NOTE — ED Provider Notes (Signed)
[facility]    CSN: [id number] Arrival date & time: 07/29/21  1704      History   Chief Complaint Chief Complaint  Patient presents with   Nausea   Generalized Body Aches    HPI Erin Pollard is a 51 y.o. female.   Patient presents to urgent care for evaluation of 3-day history of generalized body aches, nausea, and "feeling off".  Patient states that 3 days ago, she helped to disassemble a large pool and physically exerted herself more than her normal amount.  She woke up the next day with generalized body aches.  She states that she drinks plenty of water, but she was out in the heat while physically exerting herself the day before helping her family to disassemble the pool.  She is questioning whether or not this has any relation to her menstrual cycle as she states that she is experiencing some nausea and abdominal cramping without vomiting.  She has an appointment with her OB/GYN tomorrow.  Her last menstrual cycle was in May and before that it was 6 months prior.  She is premenopausal and states that she has hot flashes frequently.  She reports feeling hot then cold at home but attributes this to the after mentioned hot flashes.  No recent antibiotic use.  Denies diarrhea, constipation, numbness and tingling to bilateral lower extremities, dry mouth, feeling shaky, decreased appetite, and sick contacts.  No dizziness reported.     Past Medical History:  Diagnosis Date   Anxiety    Cervical radiculopathy    GERD (gastroesophageal reflux disease)    Hypertension    Myocardial infarction [facility])    Panic attack     Patient Active Problem List   Diagnosis Date Noted   Generalized anxiety disorder with panic attacks 06/27/2021   Sleep disturbances 06/27/2021    Past Surgical History:  Procedure Laterality Date   LEFT HEART CATH     TUBAL LIGATION      OB History   No obstetric history on file.      Home Medications    Prior to Admission medications    Medication Sig Start Date End Date Taking? Authorizing Provider  cholecalciferol (VITAMIN D3) 25 MCG (1000 UNIT) tablet Take 1,000 Units by mouth daily.   Yes [provider]  clonazePAM (KLONOPIN) 0.5 MG tablet Take 1 tablet (0.5 mg total) by mouth 3 (three) times daily. 07/21/21  Yes Nwoko, Tommas Olp, PA  Iron, Ferrous Sulfate, 325 (65 Fe) MG TABS Take 325 mg by mouth daily. 12/25/20  Yes Grayce Sessions, NP  KLOR-CON M10 10 MEQ tablet TAKE 1 TABLET BY MOUTH EVERY DAY 06/07/21  Yes Grayce Sessions, NP  labetalol (NORMODYNE) 200 MG tablet TAKE 1 TABLET BY MOUTH EVERY DAY 07/09/21  Yes Grayce Sessions, NP  losartan (COZAAR) 50 MG tablet Take 1 tablet (50 mg total) by mouth daily. 07/11/21 10/09/21 Yes Tolia, Sunit, DO  mirtazapine (REMERON) 15 MG tablet TAKE 1 TABLET BY MOUTH EVERYDAY AT BEDTIME 07/11/21  Yes Nwoko, Uchenna E, PA  Omega-3 Fatty Acids (FISH OIL) 1000 MG CAPS Take 1 capsule by mouth daily.   Yes [provider]  omeprazole (PRILOSEC) 20 MG capsule Take 1 capsule (20 mg total) by mouth daily. 06/07/21  Yes Grayce Sessions, NP  ondansetron (ZOFRAN-ODT) 4 MG disintegrating tablet Take 4 mg by mouth every 8 (eight) hours as needed for nausea or vomiting.   Yes [provider]  sucralfate (CARAFATE)  1 g tablet Take 1 tablet (1 g total) by mouth 4 (four) times daily -  with meals and at bedtime. 04/20/21  Yes Roxy Horseman, PA-C  atorvastatin (LIPITOR) 40 MG tablet Take 1 tablet (40 mg total) by mouth at bedtime. 03/15/21 06/13/21  Tessa Lerner, DO    Family History Family History  Problem Relation Age of Onset   Heart attack Mother    Cancer Father    Stroke Father    Stroke Sister    Heart disease Maternal Grandmother    Heart attack Maternal Grandmother    Heart disease Maternal Grandfather    Stroke Maternal Grandfather     Social History Social History   Tobacco Use   Smoking status: Former    Types: Cigarettes  Vaping Use   Vaping  Use: Never used  Substance Use Topics   Alcohol use: Not Currently   Drug use: Never     Allergies   Amlodipine, Hydrocodone-acetaminophen, Iodinated contrast media, Iodine, Hydrocortisone-iodoquinol, and Propoxyphene   Review of Systems Review of Systems   Physical Exam Triage Vital Signs ED Triage Vitals  Enc Vitals Group     BP 07/29/21 1809 138/80     Pulse Rate 07/29/21 1809 78     Resp 07/29/21 1809 16     Temp 07/29/21 1809 98.7 F (37.1 C)     Temp Source 07/29/21 1809 Oral     SpO2 07/29/21 1809 93 %     Weight 07/29/21 1812 166 lb 3.2 oz (75.4 kg)     Height 07/29/21 1812 5' (1.524 m)     Head Circumference --      Peak Flow --      Pain Score 07/29/21 1812 4     Pain Loc --      Pain Edu? --      Excl. in GC? --    No data found.  Updated Vital Signs BP 138/80 (BP Location: Left Arm)   Pulse 78   Temp 98.7 F (37.1 C) (Oral)   Resp 16   Ht 5' (1.524 m)   Wt 166 lb 3.2 oz (75.4 kg)   SpO2 93%   BMI 32.46 kg/m   Visual Acuity Right Eye Distance:   Left Eye Distance:   Bilateral Distance:    Right Eye Near:   Left Eye Near:    Bilateral Near:     Physical Exam   UC Treatments / Results  Labs (all labs ordered are listed, but only abnormal results are displayed) Labs Reviewed - No data to display  EKG   Radiology No results found.  Procedures Procedures (including critical care time)  Medications Ordered in UC Medications - No data to display  Initial Impression / Assessment and Plan / UC Course  I have reviewed the triage vital signs and the nursing notes.  Pertinent labs & imaging results that were available during my care of the patient were reviewed by me and considered in my medical decision making (see chart for details).     *** Final Clinical Impressions(s) / UC Diagnoses   Final diagnoses:  None   Discharge Instructions   None    ED Prescriptions   None    PDMP not reviewed this encounter.

## 2021-07-30 ENCOUNTER — Other Ambulatory Visit (HOSPITAL_COMMUNITY): Payer: Self-pay | Admitting: Psychiatry

## 2021-07-30 DIAGNOSIS — F411 Generalized anxiety disorder: Secondary | ICD-10-CM

## 2021-07-30 LAB — SARS CORONAVIRUS 2 (TAT 6-24 HRS): SARS Coronavirus 2: NEGATIVE

## 2021-07-30 MED ORDER — CLONAZEPAM 0.5 MG PO TABS: 0.5000 mg | ORAL_TABLET | Freq: Three times a day (TID) | ORAL | 1 refills | Status: DC

## 2021-07-30 NOTE — Telephone Encounter (Signed)
Medication refilled and sent to preferred pharmacy

## 2021-07-31 ENCOUNTER — Other Ambulatory Visit (INDEPENDENT_AMBULATORY_CARE_PROVIDER_SITE_OTHER): Payer: Self-pay | Admitting: Primary Care

## 2021-07-31 DIAGNOSIS — E876 Hypokalemia: Secondary | ICD-10-CM

## 2021-07-31 MED ORDER — POTASSIUM CHLORIDE CRYS ER 10 MEQ PO TBCR: 10.0000 meq | EXTENDED_RELEASE_TABLET | Freq: Every day | ORAL | 0 refills | Status: DC

## 2021-07-31 NOTE — Telephone Encounter (Signed)
Routed to PCP 

## 2021-08-08 ENCOUNTER — Other Ambulatory Visit (INDEPENDENT_AMBULATORY_CARE_PROVIDER_SITE_OTHER): Payer: Self-pay | Admitting: Primary Care

## 2021-08-08 DIAGNOSIS — I10 Essential (primary) hypertension: Secondary | ICD-10-CM

## 2021-08-08 NOTE — Telephone Encounter (Signed)
Discontinued 01/11/21 Requested Prescriptions  Pending Prescriptions Disp Refills  . hydrochlorothiazide (HYDRODIURIL) 12.5 MG tablet [Pharmacy Med Name: HYDROCHLOROTHIAZIDE 12.5 MG TB] 90 tablet 1    Sig: TAKE 1 TABLET BY MOUTH EVERY DAY     Cardiovascular: Diuretics - Thiazide Failed - 08/08/2021  2:28 AM      Failed - K in normal range and within 180 days    Potassium  Date Value Ref Range Status  06/10/2021 3.3 (L) 3.5 - 5.1 mmol/L Final         Passed - Cr in normal range and within 180 days    Creatinine, Ser  Date Value Ref Range Status  06/10/2021 0.60 0.44 - 1.00 mg/dL Final         Passed - Na in normal range and within 180 days    Sodium  Date Value Ref Range Status  06/10/2021 140 135 - 145 mmol/L Final  02/16/2021 139 134 - 144 mmol/L Final         Passed - Last BP in normal range    BP Readings from Last 1 Encounters:  07/29/21 138/80         Passed - Valid encounter within last 6 months    Recent Outpatient Visits          3 months ago Essential hypertension   [facility] Grayce Sessions, NP   6 months ago Screening for diabetes mellitus   [facility] Grayce Sessions, NP   7 months ago Encounter to establish care   [facility] Grayce Sessions, NP      Future Appointments            In 1 week Randa Evens, Kinnie Scales, NP [facility]   In 3 months Odis Hollingshead, Sunit, DO [facility]

## 2021-08-20 ENCOUNTER — Ambulatory Visit (INDEPENDENT_AMBULATORY_CARE_PROVIDER_SITE_OTHER): Payer: Medicaid Other | Admitting: Primary Care

## 2021-08-20 ENCOUNTER — Telehealth (INDEPENDENT_AMBULATORY_CARE_PROVIDER_SITE_OTHER): Payer: Self-pay | Admitting: Primary Care

## 2021-08-20 NOTE — Telephone Encounter (Signed)
Patient aware that no appointments available any sooner. Scheduled for 09/04/21 at 10:10

## 2021-08-20 NOTE — Telephone Encounter (Signed)
Copied from CRM [id number]. Topic: Appointment Scheduling - Scheduling Inquiry for Clinic >> Aug 20, 2021  9:34 AM PQDIYMEB J wrote: Reason for CRM: pt called in to reschedule her appt. Offered pt next available (August) pt declined rescheduling.  pt would like to know if provider could see her sooner. Pt says that she is unable to make her apt today due to an emergency.   CB: [id number]

## 2021-08-27 ENCOUNTER — Telehealth (INDEPENDENT_AMBULATORY_CARE_PROVIDER_SITE_OTHER): Payer: Commercial Managed Care - HMO | Admitting: Student in an Organized Health Care Education/Training Program

## 2021-08-27 DIAGNOSIS — F411 Generalized anxiety disorder: Secondary | ICD-10-CM | POA: Diagnosis not present

## 2021-08-27 DIAGNOSIS — F410 Panic disorder [episodic paroxysmal anxiety] without agoraphobia: Secondary | ICD-10-CM

## 2021-08-27 MED ORDER — MIRTAZAPINE 30 MG PO TABS: 30.0000 mg | ORAL_TABLET | Freq: Every day | ORAL | 2 refills | Status: DC

## 2021-08-27 MED ORDER — CLONAZEPAM 0.5 MG PO TABS: 0.5000 mg | ORAL_TABLET | Freq: Three times a day (TID) | ORAL | 1 refills | Status: DC
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
## 2021-08-29 ENCOUNTER — Other Ambulatory Visit: Payer: Self-pay | Admitting: Cardiology

## 2021-08-29 DIAGNOSIS — I10 Essential (primary) hypertension: Secondary | ICD-10-CM

## 2021-09-04 ENCOUNTER — Encounter (INDEPENDENT_AMBULATORY_CARE_PROVIDER_SITE_OTHER): Payer: Self-pay | Admitting: Primary Care

## 2021-09-04 ENCOUNTER — Telehealth (INDEPENDENT_AMBULATORY_CARE_PROVIDER_SITE_OTHER): Payer: Self-pay

## 2021-09-04 ENCOUNTER — Ambulatory Visit (INDEPENDENT_AMBULATORY_CARE_PROVIDER_SITE_OTHER): Payer: Commercial Managed Care - HMO | Admitting: Primary Care

## 2021-09-04 VITALS — BP 145/83 | HR 70 | Temp 98.0°F | Ht 60.0 in | Wt 164.5 lb

## 2021-09-04 DIAGNOSIS — K7689 Other specified diseases of liver: Secondary | ICD-10-CM

## 2021-09-04 DIAGNOSIS — M797 Fibromyalgia: Secondary | ICD-10-CM | POA: Diagnosis not present

## 2021-09-04 DIAGNOSIS — F410 Panic disorder [episodic paroxysmal anxiety] without agoraphobia: Secondary | ICD-10-CM

## 2021-09-04 DIAGNOSIS — F411 Generalized anxiety disorder: Secondary | ICD-10-CM

## 2021-09-04 DIAGNOSIS — I10 Essential (primary) hypertension: Secondary | ICD-10-CM | POA: Diagnosis not present

## 2021-09-04 DIAGNOSIS — Z1211 Encounter for screening for malignant neoplasm of colon: Secondary | ICD-10-CM

## 2021-09-04 NOTE — Telephone Encounter (Signed)
Copied from CRM [id number]. Topic: General - Other >> Sep 04, 2021  1:04 PM Ja-Kwan M wrote: Reason for CRM: Pt reports that she had pap smear and mammogram completed 07/30/21 with Dr. Hoover Browns at [facility] [address]. Ph# [phone]

## 2021-09-04 NOTE — Progress Notes (Unsigned)
[facility]  Erin Pollard, is a 51 y.o. female  KYH:[id number]  TDV:[id number]  DOB - Jan 13, 1971  Chief Complaint  Patient presents with   cyst on liver    Found during echocardiogram       Subjective:   Erin Pollard is a 51 y.o. obese female here today for concerns with a cyst found on her liver. She also, voices concerns with right arm parenthesis. Starts at her elbow radiates to her fingers and has a hx of cervical radicular and cervical neuropathy .   Patient has No headache, No chest pain, No abdominal pain - No Nausea,  No Cough - shortness of breath. Aunt  is having open heart surgery No problems updated.  Allergies  Allergen Reactions   Amlodipine Hives    Achy legs and s.o.b Achy legs and s.o.b    Hydrocodone-Acetaminophen Hives and Rash   Iodinated Contrast Media Hives   Iodine Hives and Rash   Hydrocortisone-Iodoquinol    Propoxyphene     Past Medical History:  Diagnosis Date   Anxiety    Cervical radiculopathy    GERD (gastroesophageal reflux disease)    Hypertension    Myocardial infarction (HCC)    Panic attack     Current Outpatient Medications on File Prior to Visit  Medication Sig Dispense Refill   cholecalciferol (VITAMIN D3) 25 MCG (1000 UNIT) tablet Take 1,000 Units by mouth daily.     clonazePAM (KLONOPIN) 0.5 MG tablet Take 1 tablet (0.5 mg total) by mouth 3 (three) times daily. 90 tablet 1   hydrALAZINE (APRESOLINE) 25 MG tablet Take 25 mg by mouth 3 (three) times daily.     hydrochlorothiazide (MICROZIDE) 12.5 MG capsule Take 12.5 mg by mouth daily.     Iron, Ferrous Sulfate, 325 (65 Fe) MG TABS Take 325 mg by mouth daily. 90 tablet 1   labetalol (NORMODYNE) 200 MG tablet TAKE 1 TABLET BY MOUTH EVERY DAY 90 tablet 0   losartan (COZAAR) 50 MG tablet TAKE 1 TABLET BY MOUTH EVERY DAY 30 tablet 2   methocarbamol (ROBAXIN) 500 MG tablet Take 1 tablet (500 mg total) by mouth 2 (two) times daily. 20 tablet 0   mirtazapine  (REMERON) 30 MG tablet Take 1 tablet (30 mg total) by mouth at bedtime. 30 tablet 2   naproxen (NAPROSYN) 500 MG tablet Take 1 tablet (500 mg total) by mouth 2 (two) times daily. 30 tablet 0   Omega-3 Fatty Acids (FISH OIL) 1000 MG CAPS Take 1 capsule by mouth daily.     omeprazole (PRILOSEC) 20 MG capsule Take 1 capsule (20 mg total) by mouth daily. 90 capsule 0   ondansetron (ZOFRAN-ODT) 4 MG disintegrating tablet Take 1 tablet (4 mg total) by mouth every 8 (eight) hours as needed for nausea or vomiting. 20 tablet 0   potassium chloride (KLOR-CON 10) 10 MEQ tablet Take 1 tablet (10 mEq total) by mouth daily. 90 tablet 0   sucralfate (CARAFATE) 1 g tablet Take 1 tablet (1 g total) by mouth 4 (four) times daily -  with meals and at bedtime. 120 tablet 0   atorvastatin (LIPITOR) 40 MG tablet Take 1 tablet (40 mg total) by mouth at bedtime. 30 tablet 2   No current facility-administered medications on file prior to visit.    Objective:   Vitals:   09/04/21 1029  BP: (!) 145/83  Pulse: 70  Temp: 98 F (36.7 C)  TempSrc: Oral  SpO2: 97%  Weight: 164 lb  8 oz (74.6 kg)  Height: 5' (1.524 m)    Exam General appearance : Awake, alert, not in any distress. Speech Clear. Not toxic looking HEENT: Atraumatic and Normocephalic, pupils equally reactive to light and accomodation Neck: Supple, no JVD. No cervical lymphadenopathy.  Chest: Good air entry bilaterally, no added sounds  CVS: S1 S2 regular, no murmurs.  Abdomen: Bowel sounds present, Non tender and not distended with no gaurding, rigidity or rebound. Extremities: B/L Lower Ext shows no edema, both legs are warm to touch Neurology: Awake alert, and oriented X 3,  Non focal Skin: No Rash  Data Review Lab Results  Component Value Date   HGBA1C 5.5 01/11/2021    Assessment & Plan  Erin Pollard was seen today for cyst on liver.  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 DIET: Limit salt intake, read nutrition labels to check salt content, limit fried and high fatty foods  Avoid using multisymptom OTC cold preparations that generally contain sudafed which can rise BP. Consult with pharmacist on best cold relief products to use for persons with HTN EXERCISE Discussed incorporating exercise such as walking - 30 minutes most days of the week and can do in 10 minute intervals     Generalized anxiety disorder with panic attacks Managed outside of practice  Fibromyalgia affecting multiple sites -     Rheumatoid Arthritis Profile  Colon cancer screening -     Ambulatory referral to Gastroenterology   Patient have been counseled extensively about nutrition and exercise. Other issues discussed during this visit include: low cholesterol diet, weight control and daily exercise, foot care, annual eye examinations at Ophthalmology, importance of adherence with medications and regular follow-up. We also discussed long term complications of uncontrolled diabetes and hypertension.   Return for annual physical.  The patient was given clear instructions to go to ER or return to medical center if symptoms don't improve, worsen or new problems develop. The patient verbalized understanding. The patient was told to call to get lab results if they haven't heard anything in the next week.   This note has been created with Education officer, environmental. Any transcriptional errors are unintentional.   Grayce Sessions, NP 09/05/2021, 9:01 PM

## 2021-09-06 LAB — RHEUMATOID ARTHRITIS PROFILE
Cyclic Citrullin Peptide Ab: 5 units (ref 0–19)
Rheumatoid fact SerPl-aCnc: <10 IU/mL (ref <14.0)

## 2021-09-11 ENCOUNTER — Other Ambulatory Visit: Payer: Self-pay

## 2021-09-11 DIAGNOSIS — E876 Hypokalemia: Secondary | ICD-10-CM

## 2021-09-11 DIAGNOSIS — I10 Essential (primary) hypertension: Secondary | ICD-10-CM

## 2021-09-11 MED ORDER — HYDRALAZINE HCL 25 MG PO TABS: 25.0000 mg | ORAL_TABLET | Freq: Three times a day (TID) | ORAL | 1 refills | Status: DC

## 2021-09-11 MED ORDER — HYDROCHLOROTHIAZIDE 12.5 MG PO CAPS: 12.5000 mg | ORAL_CAPSULE | Freq: Every day | ORAL | 1 refills | Status: DC

## 2021-09-11 MED ORDER — POTASSIUM CHLORIDE ER 10 MEQ PO TBCR: 10.0000 meq | EXTENDED_RELEASE_TABLET | Freq: Every day | ORAL | 1 refills | Status: DC

## 2021-09-11 MED ORDER — LABETALOL HCL 200 MG PO TABS: 200.0000 mg | ORAL_TABLET | Freq: Every day | ORAL | 1 refills | Status: DC

## 2021-09-11 MED ORDER — LOSARTAN POTASSIUM 50 MG PO TABS: 50.0000 mg | ORAL_TABLET | Freq: Every day | ORAL | 1 refills | Status: DC

## 2021-09-11 MED ORDER — NAPROXEN 500 MG PO TABS: 500.0000 mg | ORAL_TABLET | Freq: Two times a day (BID) | ORAL | 1 refills | Status: DC

## 2021-09-13 ENCOUNTER — Emergency Department (HOSPITAL_COMMUNITY)
Admission: EM | Admit: 2021-09-13 | Discharge: 2021-09-14 | Disposition: A | Discharge status: Home / Self Care | Payer: Commercial Managed Care - HMO | Attending: Emergency Medicine | Admitting: Emergency Medicine

## 2021-09-13 ENCOUNTER — Encounter (HOSPITAL_COMMUNITY): Payer: Self-pay

## 2021-09-13 ENCOUNTER — Other Ambulatory Visit: Payer: Self-pay

## 2021-09-13 DIAGNOSIS — Z79899 Other long term (current) drug therapy: Secondary | ICD-10-CM | POA: Diagnosis not present

## 2021-09-13 DIAGNOSIS — I10 Essential (primary) hypertension: Secondary | ICD-10-CM | POA: Diagnosis present

## 2021-09-13 NOTE — ED Triage Notes (Signed)
Was cooking dinner tonight and "felt a little off" checked blood pressure and it was >200sbp.   Took all prescribed antihypertensives.

## 2021-09-14 LAB — CBC WITH DIFFERENTIAL/PLATELET
Abs Immature Granulocytes: 0.01 10*3/uL (ref 0.00–0.07)
Basophils Absolute: 0 10*3/uL (ref 0.0–0.1)
Basophils Relative: 0 %
Eosinophils Absolute: 0.1 10*3/uL (ref 0.0–0.5)
Eosinophils Relative: 2 %
HCT: 36 % (ref 36.0–46.0)
Hemoglobin: 11.7 g/dL — ABNORMAL LOW (ref 12.0–15.0)
Immature Granulocytes: 0 %
Lymphocytes Relative: 39 %
Lymphs Abs: 2 10*3/uL (ref 0.7–4.0)
MCH: 26.3 pg (ref 26.0–34.0)
MCHC: 32.5 g/dL (ref 30.0–36.0)
MCV: 80.9 fL (ref 80.0–100.0)
Monocytes Absolute: 0.5 10*3/uL (ref 0.1–1.0)
Monocytes Relative: 9 %
Neutro Abs: 2.6 10*3/uL (ref 1.7–7.7)
Neutrophils Relative %: 50 %
Platelets: 316 10*3/uL (ref 150–400)
RBC: 4.45 MIL/uL (ref 3.87–5.11)
RDW: 14.9 % (ref 11.5–15.5)
WBC: 5.2 10*3/uL (ref 4.0–10.5)
nRBC: 0 % (ref 0.0–0.2)

## 2021-09-14 LAB — COMPREHENSIVE METABOLIC PANEL
ALT: 15 U/L (ref 0–44)
AST: 13 U/L — ABNORMAL LOW (ref 15–41)
Albumin: 3.7 g/dL (ref 3.5–5.0)
Alkaline Phosphatase: 78 U/L (ref 38–126)
Anion gap: 7 (ref 5–15)
BUN: 9 mg/dL (ref 6–20)
CO2: 26 mmol/L (ref 22–32)
Calcium: 8.9 mg/dL (ref 8.9–10.3)
Chloride: 107 mmol/L (ref 98–111)
Creatinine, Ser: 0.65 mg/dL (ref 0.44–1.00)
GFR, Estimated: >60 mL/min (ref >60)
Glucose, Bld: 100 mg/dL — ABNORMAL HIGH (ref 70–99)
Potassium: 3.4 mmol/L — ABNORMAL LOW (ref 3.5–5.1)
Sodium: 140 mmol/L (ref 135–145)
Total Bilirubin: 0.5 mg/dL (ref 0.3–1.2)
Total Protein: 6.9 g/dL (ref 6.5–8.1)

## 2021-09-14 LAB — LIPID PANEL
Cholesterol: 214 mg/dL — ABNORMAL HIGH (ref 0–200)
HDL: 43 mg/dL (ref >40)
LDL Cholesterol: 160 mg/dL — ABNORMAL HIGH (ref 0–99)
Total CHOL/HDL Ratio: 5 RATIO
Triglycerides: 57 mg/dL (ref <150)
VLDL: 11 mg/dL (ref 0–40)

## 2021-09-14 LAB — LDL CHOLESTEROL, DIRECT: Direct LDL: 169 mg/dL — ABNORMAL HIGH (ref 0–99)

## 2021-09-14 LAB — TROPONIN I (HIGH SENSITIVITY): Troponin I (High Sensitivity): 4 ng/L (ref <18)

## 2021-09-14 MED ORDER — ACETAMINOPHEN 500 MG PO TABS
1000.0000 mg | ORAL_TABLET | Freq: Once | ORAL | Status: AC
  Administered 2021-09-14: 1000 mg via ORAL
  Filled 2021-09-14: qty 2

## 2021-09-14 NOTE — ED Provider Notes (Signed)
[facility]   CSN: [id number] Arrival date & time: 09/13/21  2319     History  Chief Complaint  Patient presents with   Hypertension    Erin Pollard is a 51 y.o. female.  51 year old female who presents the ER today with hypertension.  Patient states that she has anxiety and wonders if that could be the cause.  She states that she had a somewhat stressful phone call with her aunt.  After she got off the phone she was standing up cooking and she started to feel little bit "funny and not right".  Patient states that she has felt this before with high blood pressure so she checked her blood pressure and it was elevated above 200 systolic.  She initially called EMS who stated that they could send a nonemergent truck but then when her blood pressure was so high they sent a paramedic.  Still had high blood pressure but not as high so they recommended she be checked out he has some chest pain while feeling not right which she had before with anxiety.  She had no currents of breath, headache.  No recent changes in urination or defecation.  No lower extremity swelling.  No other associated symptoms.   Hypertension       Home Medications Prior to Admission medications   Medication Sig Start Date End Date Taking? Authorizing Provider  atorvastatin (LIPITOR) 40 MG tablet Take 1 tablet (40 mg total) by mouth at bedtime. 03/15/21 06/13/21  Tolia, Sunit, DO  cholecalciferol (VITAMIN D3) 25 MCG (1000 UNIT) tablet Take 1,000 Units by mouth daily.    [provider]  clonazePAM (KLONOPIN) 0.5 MG tablet Take 1 tablet (0.5 mg total) by mouth 3 (three) times daily. 08/27/21   Bobbye Morton, MD  hydrALAZINE (APRESOLINE) 25 MG tablet Take 1 tablet (25 mg total) by mouth 3 (three) times daily. 09/11/21   Tolia, Sunit, DO  hydrochlorothiazide (MICROZIDE) 12.5 MG capsule Take 1 capsule (12.5 mg total) by mouth daily. 09/11/21   Tolia, Sunit, DO  Iron,  Ferrous Sulfate, 325 (65 Fe) MG TABS Take 325 mg by mouth daily. 12/25/20   Grayce Sessions, NP  KLOR-CON M10 10 MEQ tablet Take 10 mEq by mouth daily. 08/31/21   [provider]  labetalol (NORMODYNE) 200 MG tablet Take 1 tablet (200 mg total) by mouth daily. 09/11/21   Tolia, Sunit, DO  losartan (COZAAR) 50 MG tablet Take 1 tablet (50 mg total) by mouth daily. 09/11/21   Tolia, Sunit, DO  methocarbamol (ROBAXIN) 500 MG tablet Take 1 tablet (500 mg total) by mouth 2 (two) times daily. 07/29/21   Carlisle Beers, FNP  mirtazapine (REMERON) 30 MG tablet Take 1 tablet (30 mg total) by mouth at bedtime. 08/27/21 08/27/22  Bobbye Morton, MD  naproxen (NAPROSYN) 500 MG tablet Take 1 tablet (500 mg total) by mouth 2 (two) times daily. 09/11/21   Tolia, Sunit, DO  Omega-3 Fatty Acids (FISH OIL) 1000 MG CAPS Take 1 capsule by mouth daily.    [provider]  omeprazole (PRILOSEC) 20 MG capsule Take 1 capsule (20 mg total) by mouth daily. 06/07/21   Grayce Sessions, NP  ondansetron (ZOFRAN-ODT) 4 MG disintegrating tablet Take 1 tablet (4 mg total) by mouth every 8 (eight) hours as needed for nausea or vomiting. 07/29/21   Carlisle Beers, FNP  potassium chloride (KLOR-CON 10) 10 MEQ tablet Take 1 tablet (10 mEq total)  by mouth daily. 09/11/21   Tolia, Sunit, DO  sucralfate (CARAFATE) 1 g tablet Take 1 tablet (1 g total) by mouth 4 (four) times daily -  with meals and at bedtime. 04/20/21   Roxy Horseman, PA-C      Allergies    Amlodipine, Hydrocodone-acetaminophen, Iodinated contrast media, Iodine, Hydrocortisone-iodoquinol, and Propoxyphene    Review of Systems   Review of Systems  Physical Exam Updated Vital Signs BP (!) 140/71 (BP Location: Right Arm)   Pulse 76   Temp 98.1 F (36.7 C) (Oral)   Resp 15   Ht 5' (1.524 m)   Wt 74.4 kg   SpO2 100%   BMI 32.03 kg/m  Physical Exam Vitals and nursing note reviewed.  Constitutional:      Appearance: She is  well-developed.  HENT:     Head: Normocephalic and atraumatic.     Mouth/Throat:     Mouth: Mucous membranes are moist.     Pharynx: Oropharynx is clear.  Eyes:     Pupils: Pupils are equal, round, and reactive to light.  Cardiovascular:     Rate and Rhythm: Normal rate and regular rhythm.  Pulmonary:     Effort: No respiratory distress.     Breath sounds: No stridor. No wheezing or rales.  Abdominal:     General: There is no distension.  Musculoskeletal:        General: No swelling.     Cervical back: Normal range of motion.  Neurological:     Mental Status: She is alert.     ED Results / Procedures / Treatments   Labs (all labs ordered are listed, but only abnormal results are displayed) Labs Reviewed  LIPID PANEL - Abnormal; Notable for the following components:      Result Value   Cholesterol 214 (*)    LDL Cholesterol 160 (*)    All other components within normal limits  CBC WITH DIFFERENTIAL/PLATELET - Abnormal; Notable for the following components:   Hemoglobin 11.7 (*)    All other components within normal limits  COMPREHENSIVE METABOLIC PANEL - Abnormal; Notable for the following components:   Potassium 3.4 (*)    Glucose, Bld 100 (*)    AST 13 (*)    All other components within normal limits  LDL CHOLESTEROL, DIRECT  TROPONIN I (HIGH SENSITIVITY)    EKG EKG Interpretation  Date/Time:  Friday September 14 2021 02:06:27 EDT Ventricular Rate:  67 PR Interval:  161 QRS Duration: 80 QT Interval:  393 QTC Calculation: 415 R Axis:   8 Text Interpretation: Sinus rhythm Confirmed by Marily Memos [phone]) on 09/14/2021 3:01:09 AM  Radiology No results found.  Procedures Procedures    Medications Ordered in ED Medications  acetaminophen (TYLENOL) tablet 1,000 mg (1,000 mg Oral Given 09/14/21 0204)    ED Course/ Medical Decision Making/ A&P                           Medical Decision Making Amount and/or Complexity of Data Reviewed Labs:  ordered. ECG/medicine tests: ordered.  Risk OTC drugs.  No e/o end organ damage related to BP. However, with CP, will check ECG/troponin/labs. Will also check cholesterol as she has scheduled for an upcoming PCP appointment.  Labs reassuring near baseline. BP's stable in 140 range. Stable for discharge.   Final Clinical Impression(s) / ED Diagnoses Final diagnoses:  Hypertension, unspecified type    Rx / DC Orders ED Discharge Orders  None         Oryon Gary, Barbara Cower, MD 09/14/21 [phone]

## 2021-09-14 NOTE — ED Notes (Signed)
LT green tube recollected and sent to main lab

## 2021-09-14 NOTE — ED Notes (Signed)
Lab confirmed receipt of blood sample for pending labs.

## 2021-09-20 ENCOUNTER — Telehealth: Payer: Self-pay | Admitting: Gastroenterology

## 2021-09-20 LAB — CMP14+EGFR
ALT: 13 IU/L (ref 0–32)
AST: 12 IU/L (ref 0–40)
Albumin/Globulin Ratio: 1.6 (ref 1.2–2.2)
Albumin: 4.4 g/dL (ref 3.8–4.9)
Alkaline Phosphatase: 101 IU/L (ref 44–121)
BUN/Creatinine Ratio: 12 (ref 9–23)
BUN: 8 mg/dL (ref 6–24)
Bilirubin Total: 0.3 mg/dL (ref 0.0–1.2)
CO2: 24 mmol/L (ref 20–29)
Calcium: 9.5 mg/dL (ref 8.7–10.2)
Chloride: 100 mmol/L (ref 96–106)
Creatinine, Ser: 0.69 mg/dL (ref 0.57–1.00)
Globulin, Total: 2.8 g/dL (ref 1.5–4.5)
Glucose: 81 mg/dL (ref 70–99)
Potassium: 3.8 mmol/L (ref 3.5–5.2)
Sodium: 140 mmol/L (ref 134–144)
Total Protein: 7.2 g/dL (ref 6.0–8.5)
eGFR: 104 mL/min/{1.73_m2} (ref >59)

## 2021-09-20 LAB — LIPID PANEL WITH LDL/HDL RATIO
Cholesterol, Total: 221 mg/dL — ABNORMAL HIGH (ref 100–199)
Cholesterol, Total: 225 mg/dL — ABNORMAL HIGH (ref 100–199)
HDL: 50 mg/dL (ref >39)
HDL: 52 mg/dL (ref >39)
LDL Chol Calc (NIH): 154 mg/dL — ABNORMAL HIGH (ref 0–99)
LDL Chol Calc (NIH): 156 mg/dL — ABNORMAL HIGH (ref 0–99)
LDL/HDL Ratio: 3 ratio (ref 0.0–3.2)
LDL/HDL Ratio: 3.1 ratio (ref 0.0–3.2)
Triglycerides: 97 mg/dL (ref 0–149)
Triglycerides: 98 mg/dL (ref 0–149)
VLDL Cholesterol Cal: 17 mg/dL (ref 5–40)
VLDL Cholesterol Cal: 17 mg/dL (ref 5–40)

## 2021-09-20 NOTE — Telephone Encounter (Signed)
Hi Dr. Barron Alvine,  We received records on this patient who is requesting a transfer of care to you.  Records will be sent to you for your consideration.  Please let me know how you would like to proceed.  Thank you.

## 2021-09-24 ENCOUNTER — Other Ambulatory Visit (INDEPENDENT_AMBULATORY_CARE_PROVIDER_SITE_OTHER): Payer: Self-pay | Admitting: Primary Care

## 2021-09-24 DIAGNOSIS — I10 Essential (primary) hypertension: Secondary | ICD-10-CM

## 2021-09-26 NOTE — Progress Notes (Signed)
Called and spoke to patient she said she was on 40 mg but couple days ago she went to the ED because her blood pressure was high around 179/80s and it wouldn't come down they said it could be the Atorvastatin because since she started it her bp keep being elevated. The ED told her that they never have heard statins caused high bp but she stop medication and her bp came down therefore she is not taking nothing right now

## 2021-09-27 ENCOUNTER — Encounter: Payer: Self-pay | Admitting: Gastroenterology

## 2021-09-27 NOTE — Telephone Encounter (Signed)
Records received and reviewed and notable for the following:  Family history notable for father with colon cancer.  Several family members with Crohn's disease.  Has been following at [facility] for GERD and diarrhea likely IBS.  Reflux treated with omeprazole 40 mg/day.  Previously treated with Nexium which eventually lost efficacy.  Diarrhea treated with Lomotil on demand.  Does have a history of H. pylori in the past.  - 04/16/2019: EGD: Gastritis (path negative for H. pylori) - 03/19/2019: Colonoscopy: Diverticulosis.  Repeat in 5 years due to family history - 05/2020: ER evaluation for epigastric pain.  Diagnosed with gastritis, treated with Carafate and Zofran.  Improved with GI cocktail.  Reportedly negative work-up to include CT and x-ray - 06/08/2020: Follow-up in the [facility].  Prescribed Prilosec 40 mg daily and recommended 1 month follow-up   Ok to schedule OV with me for evaluation and to establish care.

## 2021-10-02 ENCOUNTER — Telehealth (INDEPENDENT_AMBULATORY_CARE_PROVIDER_SITE_OTHER): Payer: Commercial Managed Care - HMO | Admitting: Physician Assistant

## 2021-10-02 ENCOUNTER — Encounter (HOSPITAL_COMMUNITY): Payer: Self-pay | Admitting: Physician Assistant

## 2021-10-02 DIAGNOSIS — F410 Panic disorder [episodic paroxysmal anxiety] without agoraphobia: Secondary | ICD-10-CM | POA: Diagnosis not present

## 2021-10-02 DIAGNOSIS — F411 Generalized anxiety disorder: Secondary | ICD-10-CM

## 2021-10-02 MED ORDER — MIRTAZAPINE 30 MG PO TABS: 30.0000 mg | ORAL_TABLET | Freq: Every day | ORAL | 0 refills | Status: DC

## 2021-10-02 MED ORDER — CLONAZEPAM 0.5 MG PO TABS
0.5000 mg | ORAL_TABLET | Freq: Three times a day (TID) | ORAL | 0 refills | Status: DC
Start: 2021-10-02 — End: 2021-12-05

## 2021-10-02 NOTE — Progress Notes (Unsigned)
BH MD/PA/NP OP Progress Note  Virtual Visit via Video Note  I connected with Erin Pollard on 10/02/21 at  9:30 AM EDT by a video enabled telemedicine application and verified that I am speaking with the correct person using two identifiers.  Location: Patient: Home Provider: Clinic   I discussed the limitations of evaluation and management by telemedicine and the availability of in person appointments. The patient expressed understanding and agreed to proceed.  Follow Up Instructions:  I discussed the assessment and treatment plan with the patient. The patient was provided an opportunity to ask questions and all were answered. The patient agreed with the plan and demonstrated an understanding of the instructions.   The patient was advised to call back or seek an in-person evaluation if the symptoms worsen or if the condition fails to improve as anticipated.  I provided 37 minutes of non-face-to-face time during this encounter.  Meta Hatchet, PA   10/02/2021 6:04 PM Erin Pollard  MRN:  [id number]  Chief Complaint:  Chief Complaint  Patient presents with   Follow-up   Medication Refill   HPI: ***  Erin Pollard  Visit Diagnosis:    ICD-10-CM   1. Generalized anxiety disorder with panic attacks  F41.1 mirtazapine (REMERON) 30 MG tablet   F41.0 clonazePAM (KLONOPIN) 0.5 MG tablet      Past Psychiatric History:  Generalized anxiety disorder with panic attacks  Past Medical History:  Past Medical History:  Diagnosis Date   Anxiety    Cervical radiculopathy    GERD (gastroesophageal reflux disease)    Hypertension    Myocardial infarction (HCC)    Panic attack     Past Surgical History:  Procedure Laterality Date   LEFT HEART CATH     TUBAL LIGATION      Family Psychiatric History:  Patient denies a family history of psychiatric illness.  She does report that 2 of her eldest sons have struggled with drug abuse  Family History:  Family History   Problem Relation Age of Onset   Heart attack Mother    Cancer Father    Stroke Father    Stroke Sister    Heart disease Maternal Grandmother    Heart attack Maternal Grandmother    Heart disease Maternal Grandfather    Stroke Maternal Grandfather     Social History:  Social History   Socioeconomic History   Marital status: Divorced    Spouse name: Not on file   Number of children: Not on file   Years of education: Not on file   Highest education level: Not on file  Occupational History   Not on file  Tobacco Use   Smoking status: Former    Types: Cigarettes   Smokeless tobacco: Not on file  Vaping Use   Vaping Use: Never used  Substance and Sexual Activity   Alcohol use: Not Currently   Drug use: Never   Sexual activity: Yes  Other Topics Concern   Not on file  Social History Narrative   Not on file   Social Determinants of Health   Financial Resource Strain: Not on file  Food Insecurity: Not on file  Transportation Needs: Not on file  Physical Activity: Not on file  Stress: Not on file  Social Connections: Not on file    Allergies:  Allergies  Allergen Reactions   Amlodipine Hives    Achy legs and s.o.b Achy legs and s.o.b    Hydrocodone-Acetaminophen Hives and Rash  Iodinated Contrast Media Hives   Iodine Hives and Rash   Hydrocortisone-Iodoquinol    Propoxyphene     Metabolic Disorder Labs: Lab Results  Component Value Date   HGBA1C 5.5 01/11/2021   No results found for: "PROLACTIN" Lab Results  Component Value Date   CHOL 221 (H) 09/19/2021   TRIG 98 09/19/2021   HDL 50 09/19/2021   CHOLHDL 5.0 09/14/2021   VLDL 11 09/14/2021   LDLCALC 154 (H) 09/19/2021   LDLCALC 156 (H) 09/19/2021   No results found for: "TSH"  Therapeutic Level Labs: No results found for: "LITHIUM" No results found for: "VALPROATE" No results found for: "CBMZ"  Current Medications: Current Outpatient Medications  Medication Sig Dispense Refill    atorvastatin (LIPITOR) 40 MG tablet Take 1 tablet (40 mg total) by mouth at bedtime. 30 tablet 2   cholecalciferol (VITAMIN D3) 25 MCG (1000 UNIT) tablet Take 1,000 Units by mouth daily.     clonazePAM (KLONOPIN) 0.5 MG tablet Take 1 tablet (0.5 mg total) by mouth 3 (three) times daily. 270 tablet 0   hydrALAZINE (APRESOLINE) 25 MG tablet Take 1 tablet (25 mg total) by mouth 3 (three) times daily. 90 tablet 1   hydrochlorothiazide (MICROZIDE) 12.5 MG capsule Take 1 capsule (12.5 mg total) by mouth daily. 90 capsule 1   Iron, Ferrous Sulfate, 325 (65 Fe) MG TABS Take 325 mg by mouth daily. 90 tablet 1   KLOR-CON M10 10 MEQ tablet Take 10 mEq by mouth daily.     labetalol (NORMODYNE) 200 MG tablet Take 1 tablet (200 mg total) by mouth daily. 90 tablet 1   losartan (COZAAR) 50 MG tablet Take 1 tablet (50 mg total) by mouth daily. 90 tablet 1   methocarbamol (ROBAXIN) 500 MG tablet Take 1 tablet (500 mg total) by mouth 2 (two) times daily. 20 tablet 0   mirtazapine (REMERON) 30 MG tablet Take 1 tablet (30 mg total) by mouth at bedtime. 90 tablet 0   naproxen (NAPROSYN) 500 MG tablet Take 1 tablet (500 mg total) by mouth 2 (two) times daily. 90 tablet 1   Omega-3 Fatty Acids (FISH OIL) 1000 MG CAPS Take 1 capsule by mouth daily.     omeprazole (PRILOSEC) 20 MG capsule Take 1 capsule (20 mg total) by mouth daily. 90 capsule 0   ondansetron (ZOFRAN-ODT) 4 MG disintegrating tablet Take 1 tablet (4 mg total) by mouth every 8 (eight) hours as needed for nausea or vomiting. 20 tablet 0   potassium chloride (KLOR-CON 10) 10 MEQ tablet Take 1 tablet (10 mEq total) by mouth daily. 90 tablet 1   sucralfate (CARAFATE) 1 g tablet Take 1 tablet (1 g total) by mouth 4 (four) times daily -  with meals and at bedtime. 120 tablet 0   No current facility-administered medications for this visit.     Musculoskeletal: Strength & Muscle Tone: within normal limits Gait & Station: normal Patient leans:  N/A  Psychiatric Specialty Exam: Review of Systems  Psychiatric/Behavioral:  Negative for decreased concentration, dysphoric mood, hallucinations, self-injury, sleep disturbance and suicidal ideas. The patient is nervous/anxious. The patient is not hyperactive.     There were no vitals taken for this visit.There is no height or weight on file to calculate BMI.  General Appearance: Casual  Eye Contact:  Good  Speech:  Clear and Coherent and Normal Rate  Volume:  Normal  Mood:  Anxious and Irritable  Affect:  Congruent  Thought Process:  Coherent, Goal Directed, and  Descriptions of Associations: Intact  Orientation:  Full (Time, Place, and Person)  Thought Content: WDL   Suicidal Thoughts:  No  Homicidal Thoughts:  No  Memory:  Immediate;   Good Recent;   Good Remote;   Good  Judgement:  Good  Insight:  Good  Psychomotor Activity:  Normal  Concentration:  Concentration: Good and Attention Span: Good  Recall:  Good  Fund of Knowledge: Good  Language: Good  Akathisia:  No  Handed:  Right  AIMS (if indicated): not done  Assets:  Communication Skills Desire for Improvement Housing Social Support Transportation  ADL's:  Intact  Cognition: WNL  Sleep:  Good   Screenings: GAD-7    Flowsheet Row Video Visit from 10/02/2021 in [facility] Office Visit from 06/27/2021 in [facility] Office Visit from 04/19/2021 in [facility] Office Visit from 01/11/2021 in [facility]  Total GAD-7 Score 9 8 6  0      PHQ2-9    Flowsheet Row Video Visit from 10/02/2021 in [facility] Office Visit from 06/27/2021 in [facility] Office Visit from 04/19/2021 in [facility] Office Visit from 01/11/2021 in [facility]  PHQ-2 Total Score 1 1 1  0  PHQ-9 Total Score -- -- -- 0      Flowsheet Row Video  Visit from 10/02/2021 in [facility] ED from 09/13/2021 in [facility] DEPT ED from 07/29/2021 in [facility] RISK CATEGORY No Risk No Risk No Risk        Assessment and Plan: ***    Collaboration of Care: Collaboration of Care: Medication Management AEB provider managing patient's psychiatric medications, Primary Care Provider AEB patient being followed by a primary care provider, Psychiatrist AEB patient being followed by mental health provider, and Other provider involved in patient's care AEB patient being seen by cardiology  Patient/Guardian was advised Release of Information must be obtained prior to any record release in order to collaborate their care with an outside provider. Patient/Guardian was advised if they have not already done so to contact the registration department to sign all necessary forms in order for [facility] to release information regarding their care.   Consent: Patient/Guardian gives verbal consent for treatment and assignment of benefits for services provided during this visit. Patient/Guardian expressed understanding and agreed to proceed.   1. Generalized anxiety disorder with panic attacks  - mirtazapine (REMERON) 30 MG tablet; Take 1 tablet (30 mg total) by mouth at bedtime.  Dispense: 90 tablet; Refill: 0 - clonazePAM (KLONOPIN) 0.5 MG tablet; Take 1 tablet (0.5 mg total) by mouth 3 (three) times daily.  Dispense: 270 tablet; Refill: 0  Patient to follow-up in 2 months Provider spent a total of 37 minutes with the patient/reviewing patient's chart  [facility], PA 10/02/2021, 6:04 PM

## 2021-10-03 ENCOUNTER — Other Ambulatory Visit: Payer: Self-pay | Admitting: Cardiology

## 2021-10-03 ENCOUNTER — Telehealth: Payer: Self-pay

## 2021-10-03 DIAGNOSIS — E7800 Pure hypercholesterolemia, unspecified: Secondary | ICD-10-CM

## 2021-10-03 MED ORDER — ROSUVASTATIN CALCIUM 20 MG PO TABS: 20.0000 mg | ORAL_TABLET | Freq: Every day | ORAL | 0 refills | Status: DC

## 2021-10-03 NOTE — Progress Notes (Signed)
Spoke to the patient over the phone regarding her recent labs.  Cholesterol still not well controlled.  She stopped taking atorvastatin sometime back because she thought the atorvastatin was causing her blood pressures to trend up.  She is working with her PCP with regards to work-up of inflammatory disease.  Given her lipid profiles, xanthelasmas, family history of CAD recommended reattempting atorvastatin or considering Crestor.  Patient would like to proceed with Crestor for now.  We will send a prescription in for Crestor 20 mg p.o. nightly.  Labs in 6 weeks to reevaluate lipids and liver function test..    Erin Pollard [facility]  Pager: [phone] Office: [phone]

## 2021-10-04 NOTE — Telephone Encounter (Signed)
Crestor like other statins may at times cause liver problems. That's why I told her to get labs done 6 weeks after starting the medication to monitor for this potential side effect.   Uncontrolled cholesterol can cause heart diease, stroke, and other co-morbid condition.   Dr. Odis Hollingshead

## 2021-10-05 ENCOUNTER — Other Ambulatory Visit: Payer: Self-pay

## 2021-10-05 DIAGNOSIS — E7800 Pure hypercholesterolemia, unspecified: Secondary | ICD-10-CM

## 2021-10-05 MED ORDER — ATORVASTATIN CALCIUM 40 MG PO TABS: 40.0000 mg | ORAL_TABLET | Freq: Every day | ORAL | 1 refills | Status: DC

## 2021-10-05 NOTE — Telephone Encounter (Signed)
Medication has been sent and orders have been placed and released

## 2021-10-05 NOTE — Telephone Encounter (Signed)
Spoke with patient and informed her of the message that was for her,  she states that she would rather go back on the Atorvastatin rather than the rosuvastatin due to the side affects.  Please advise

## 2021-10-05 NOTE — Telephone Encounter (Signed)
Please send in a prescription for atorvastatin 40 mg p.o. nightly. Labs in 6 weeks to check fasting lipid profile, direct LDL, and CMP. Sherine Cortese Erie, DO, [facility]

## 2021-10-05 NOTE — Telephone Encounter (Signed)
Please follow up.   Dr. Toshio Slusher

## 2021-10-25 ENCOUNTER — Encounter (INDEPENDENT_AMBULATORY_CARE_PROVIDER_SITE_OTHER): Payer: Commercial Managed Care - HMO | Admitting: Primary Care

## 2021-10-31 ENCOUNTER — Other Ambulatory Visit (INDEPENDENT_AMBULATORY_CARE_PROVIDER_SITE_OTHER): Payer: Self-pay | Admitting: Primary Care

## 2021-10-31 DIAGNOSIS — E876 Hypokalemia: Secondary | ICD-10-CM

## 2021-10-31 NOTE — Telephone Encounter (Signed)
Will forward to provider  

## 2021-11-01 ENCOUNTER — Ambulatory Visit (INDEPENDENT_AMBULATORY_CARE_PROVIDER_SITE_OTHER): Payer: Self-pay | Admitting: *Deleted

## 2021-11-01 NOTE — Telephone Encounter (Signed)
Summary: Pt requesting medication for congestion   Pt requests that a Rx for a medication for congestion be sent to [id number] - [place], [place] if possible. Pt requests call back to discuss and/or advise. Cb# [phone]        Chief Complaint: sinus pain and nasal congestion requesting medication / VV appt. Symptoms: sneezing, nasal congestion, post nasal drip sore throat. Reports nasal stuffiness at times. Difficulty breathing through nose can breath through mouth. Light headache. No taste. Took at home covid test this am and was negative. Has tried nasal washes with no relief. Took hot shower with some relief and then nose stuffy again after shower. Reports she can not take OTC due to BP medications .  Frequency: 2 days Pertinent Negatives: Patient denies fever, no chest pain reported.  Disposition: [] ED /[x] Urgent Care (no appt availability in office) / [] Appointment(In office/virtual)/ []  [place] [facility]/ [] Home Care/ [x] Refused Recommended Disposition /[] [place] [facility]/ []  Follow-up with PCP Additional Notes:   Requesting VV appt with PCP due to she does not have any money for UC or UC VV. Please advise if medication can be prescribed and what OTC is safe. Recommended patient call pharmacist for recommendations for OTC . Please advise . Patient would like a call back .        Reason for Disposition  [1] Sinus congestion (pressure, fullness) AND [2] present > 10 days    Less than 10 days  Answer Assessment - Initial Assessment Questions 1. LOCATION: "Where does it hurt?"      Forehead and face  2. ONSET: "When did the sinus pain start?"  (e.g., hours, days)      2 days  3. SEVERITY: "How bad is the pain?"   (Scale 1-10; mild, moderate or severe)   - MILD (1-3): doesn't interfere with normal activities    - MODERATE (4-7): interferes with normal activities (e.g., work or school) or awakens from sleep   - SEVERE (8-10): excruciating  pain and patient unable to do any normal activities        Mild to moderate can sleep  4. RECURRENT SYMPTOM: "Have you ever had sinus problems before?" If Yes, ask: "When was the last time?" and "What happened that time?"      Na  5. NASAL CONGESTION: "Is the nose blocked?" If Yes, ask: "Can you open it or must you breathe through your mouth?"     Yes must breathe through mouth  6. NASAL DISCHARGE: "Do you have discharge from your nose?" If so ask, "What color?"     Clear hint of yellow at this time  7. FEVER: "Do you have a fever?" If Yes, ask: "What is it, how was it measured, and when did it start?"      No  8. OTHER SYMPTOMS: "Do you have any other symptoms?" (e.g., sore throat, cough, earache, difficulty breathing)     Nasal congestion, post nasal drip causes sore throat  , sneezing , difficulty breathing through nose, pain in forehead and face. Light headache . No cough no fever. Covid at home test today negative.  9. PREGNANCY: "Is there any chance you are pregnant?" "When was your last menstrual period?"     na  Protocols used: Sinus Pain or Congestion-A-AH

## 2021-11-02 ENCOUNTER — Ambulatory Visit (INDEPENDENT_AMBULATORY_CARE_PROVIDER_SITE_OTHER): Payer: Self-pay | Admitting: *Deleted

## 2021-11-02 ENCOUNTER — Other Ambulatory Visit (INDEPENDENT_AMBULATORY_CARE_PROVIDER_SITE_OTHER): Payer: Self-pay | Admitting: Primary Care

## 2021-11-02 MED ORDER — OXYMETAZOLINE HCL 0.05 % NA SOLN: 1.0000 | Freq: Two times a day (BID) | NASAL | 0 refills | Status: DC

## 2021-11-02 MED ORDER — LORATADINE 10 MG PO TABS: 10.0000 mg | ORAL_TABLET | Freq: Every day | ORAL | 6 refills | Status: AC

## 2021-11-02 NOTE — Telephone Encounter (Signed)
Reason for Disposition  [1] Using nasal washes and pain medicine > 24 hours AND [2] sinus pain (around cheekbone or eye) persists    No appts.   Requesting something for the congestion.   Can't take OTC meds due to hypertension per pt.  Answer Assessment - Initial Assessment Questions 1. LOCATION: "Where does it hurt?"      See triage note from yesterday 11/01/2021.   She called back in and I returned her call.   She has not heard back from the provider so was following up. Still having a lot of sinus pain and pressure with congestion.  Requesting something for the congestion. I let her know we were waiting on a response from Juluis Mire, NP.   I'll send another message letting Sharyn Lull know you had called back in.   Pt agreeable to this and thanked me for calling her back.  2. ONSET: "When did the sinus pain start?"  (e.g., hours, days)      Not asked   See triage notes from yesterday 3. SEVERITY: "How bad is the pain?"   (Scale 1-10; mild, moderate or severe)   - MILD (1-3): doesn't interfere with normal activities    - MODERATE (4-7): interferes with normal activities (e.g., work or school) or awakens from sleep   - SEVERE (8-10): excruciating pain and patient unable to do any normal activities         4. RECURRENT SYMPTOM: "Have you ever had sinus problems before?" If Yes, ask: "When was the last time?" and "What happened that time?"       5. NASAL CONGESTION: "Is the nose blocked?" If Yes, ask: "Can you open it or must you breathe through your mouth?"      6. NASAL DISCHARGE: "Do you have discharge from your nose?" If so ask, "What color?"      7. FEVER: "Do you have a fever?" If Yes, ask: "What is it, how was it measured, and when did it start?"       8. OTHER SYMPTOMS: "Do you have any other symptoms?" (e.g., sore throat, cough, earache, difficulty breathing)      9. PREGNANCY: "Is there any chance you are pregnant?" "When was your last menstrual period?"  Protocols used:  Sinus Pain or Congestion-A-AH  Chief Complaint: Continued sinus pain, pressure and congestion and headache  (See triage notes from yesterday 11/01/2021.   Never heard back from provider.   No appts.  Requesting medication be called in for her. Symptoms: above Frequency: 2-3 days now  Pertinent Negatives: Patient denies Fever Disposition: [] ED /[] Urgent Care (no appt availability in office) / [] Appointment(In office/virtual)/ []  [place] [facility]/ [] Home Care/ [] Refused Recommended Disposition /[] [place] [facility]/ [x]  Follow-up with PCP Additional Notes: I have sent another message to Juluis Mire, NP.   Pt agreeable to someone calling her back.   There are no appts. Available and she doesn't have the money to go to urgent care or do virtual visit.

## 2021-11-02 NOTE — Telephone Encounter (Signed)
Contacted pt and lvm making her aware Erin Pollard sent in medication for sinus and if she has questions or concerns to give [place] a call

## 2021-11-02 NOTE — Telephone Encounter (Signed)
Message from Erick Blinks sent at 11/02/2021 10:13 AM EDT  Summary: Congestion, seeking appt nothing soon enough.   Pt is congested, seeking Rx from PCP. She says this is day three for her symptoms. Runny nose, all in her face she is very congested she says.   Best contact: [phone]   Seeking virtual appt, nothing soon enough.           Call History   Type Contact Phone/Fax User  11/02/2021 10:12 AM EDT Phone ([address] [place], [place] (Self) [phone] Lemmie Evens) Marlan Palau

## 2021-11-02 NOTE — Telephone Encounter (Signed)
Will forward to provider  

## 2021-11-02 NOTE — Telephone Encounter (Signed)
Medication sent for sinus

## 2021-11-02 NOTE — Telephone Encounter (Signed)
This has already been addressed

## 2021-11-14 ENCOUNTER — Ambulatory Visit (INDEPENDENT_AMBULATORY_CARE_PROVIDER_SITE_OTHER): Payer: Commercial Managed Care - HMO | Admitting: Gastroenterology

## 2021-11-14 ENCOUNTER — Encounter: Payer: Self-pay | Admitting: Gastroenterology

## 2021-11-14 VITALS — BP 110/72 | HR 81 | Ht 60.0 in | Wt 171.0 lb

## 2021-11-14 DIAGNOSIS — Z800 Family history of malignant neoplasm of digestive organs: Secondary | ICD-10-CM

## 2021-11-14 DIAGNOSIS — K219 Gastro-esophageal reflux disease without esophagitis: Secondary | ICD-10-CM | POA: Diagnosis not present

## 2021-11-14 DIAGNOSIS — K580 Irritable bowel syndrome with diarrhea: Secondary | ICD-10-CM

## 2021-11-14 DIAGNOSIS — R143 Flatulence: Secondary | ICD-10-CM

## 2021-11-14 DIAGNOSIS — K7689 Other specified diseases of liver: Secondary | ICD-10-CM | POA: Diagnosis not present

## 2021-11-14 NOTE — Progress Notes (Signed)
Chief Complaint: Diarrhea, GERD   Referring Provider:     Grayce Sessions, NP   HPI:     Erin Pollard is a 51 y.o. female referred to the [facility]astro[facility]ology Clinic for evaluation of cyst, diarrhea, GERD and to establish/transfer of GI care.  Has a hx of IBS-D. Worse with gravy, salad, leafy greens.  Tends to take probiotics daily, but interestingly if she has flares symptoms will tend to resolve after taking dose of probiotic.  No hematochezia.  Rarely uses Lomotil on demand.  Main issue today is increased flatus and gas production.  She has not been able to identify foods that tend to trigger the symptoms.  Separately, longstanding history of GERD.  Symptoms well controlled on Prilosec 40 mg daily.  Rarely uses Pepcid 20 mg prn breakthrough.  Previously used Nexium which eventually lost efficacy.  Reports a history of H. pylori in the past.   Family history notable for father with colon cancer.  Several family members with Crohn's disease.   Has been following at [facility] for GERD and IBS-D, and now establishing care with [facility] after moving to [place].    - 04/16/2019: EGD: Gastritis (path negative for H. pylori) - 03/19/2019: Colonoscopy: Diverticulosis.  Repeat in 5 years due to family history - 05/31/2019: CT A/P: Hepatic cysts with largest measuring 4.6 cm in the left hepatic lobe.  Otherwise normal liver.  Diverticulosis. - 04/13/2020: CT A/P: Diverticulosis without diverticulitis, stable 4.8 x 3.4 x 3.1 cm hepatic cyst with otherwise normal.  Liver, large uterus likely containing fibroid - 05/2020: ER evaluation for epigastric pain.  Diagnosed with gastritis, treated with Carafate and Zofran.  Improved with GI cocktail.  Reportedly negative work-up to include CT and x-ray - 06/08/2020: Follow-up in the [facility].  Prescribed Prilosec 40 mg daily and recommended 1 month follow-up    - 09/19/21: Normal CMP   Past Medical History:  Diagnosis Date    Anxiety    Cervical radiculopathy    GERD (gastroesophageal reflux disease)    Hypertension    Myocardial infarction (HCC)    Panic attack      Past Surgical History:  Procedure Laterality Date   LEFT HEART CATH     TUBAL LIGATION     Family History  Problem Relation Age of Onset   Heart attack Mother    Cancer Father    Stroke Father    Stroke Sister    Heart disease Maternal Grandmother    Heart attack Maternal Grandmother    Heart disease Maternal Grandfather    Stroke Maternal Grandfather    Social History   Tobacco Use   Smoking status: Former    Types: Cigarettes  Building services engineer Use: Never used  Substance Use Topics   Alcohol use: Not Currently   Drug use: Never   Current Outpatient Medications  Medication Sig Dispense Refill   atorvastatin (LIPITOR) 40 MG tablet Take 1 tablet (40 mg total) by mouth at bedtime. 30 tablet 1   cholecalciferol (VITAMIN D3) 25 MCG (1000 UNIT) tablet Take 1,000 Units by mouth daily.     clonazePAM (KLONOPIN) 0.5 MG tablet Take 1 tablet (0.5 mg total) by mouth 3 (three) times daily. 270 tablet 0   famotidine (PEPCID) 20 MG tablet Take 20 mg by mouth as needed.     hydrALAZINE (APRESOLINE) 25 MG tablet Take 1 tablet (25  mg total) by mouth 3 (three) times daily. 90 tablet 1   hydrochlorothiazide (MICROZIDE) 12.5 MG capsule Take 1 capsule (12.5 mg total) by mouth daily. 90 capsule 1   Iron, Ferrous Sulfate, 325 (65 Fe) MG TABS Take 325 mg by mouth daily. 90 tablet 1   KLOR-CON M10 10 MEQ tablet Take 10 mEq by mouth daily.     labetalol (NORMODYNE) 200 MG tablet Take 1 tablet (200 mg total) by mouth daily. 90 tablet 1   loratadine (CLARITIN) 10 MG tablet Take 1 tablet (10 mg total) by mouth daily. 30 tablet 6   losartan (COZAAR) 50 MG tablet Take 1 tablet (50 mg total) by mouth daily. 90 tablet 1   methocarbamol (ROBAXIN) 500 MG tablet Take 1 tablet (500 mg total) by mouth 2 (two) times daily. 20 tablet 0   mirtazapine (REMERON) 30  MG tablet Take 1 tablet (30 mg total) by mouth at bedtime. 90 tablet 0   naproxen (NAPROSYN) 500 MG tablet Take 1 tablet (500 mg total) by mouth 2 (two) times daily. 90 tablet 1   Omega-3 Fatty Acids (FISH OIL) 1000 MG CAPS Take 1 capsule by mouth daily.     omeprazole (PRILOSEC) 40 MG capsule Take 40 mg by mouth daily.     ondansetron (ZOFRAN-ODT) 4 MG disintegrating tablet Take 1 tablet (4 mg total) by mouth every 8 (eight) hours as needed for nausea or vomiting. 20 tablet 0   oxymetazoline (AFRIN NASAL SPRAY) 0.05 % nasal spray Place 1 spray into both nostrils 2 (two) times daily. 15 mL 0   potassium chloride (KLOR-CON 10) 10 MEQ tablet Take 1 tablet (10 mEq total) by mouth daily. 90 tablet 1   sucralfate (CARAFATE) 1 g tablet Take 1 tablet (1 g total) by mouth 4 (four) times daily -  with meals and at bedtime. 120 tablet 0   No current facility-administered medications for this visit.   Allergies  Allergen Reactions   Amlodipine Hives    Achy legs and s.o.b Achy legs and s.o.b    Hydrocodone-Acetaminophen Hives and Rash   Iodinated Contrast Media Hives   Iodine Hives and Rash   Hydrocortisone-Iodoquinol    Propoxyphene      Review of Systems: All systems reviewed and negative except where noted in HPI.     Physical Exam:    Wt Readings from Last 3 Encounters:  11/14/21 171 lb (77.6 kg)  09/13/21 164 lb (74.4 kg)  09/04/21 164 lb 8 oz (74.6 kg)    BP 110/72   Pulse 81   Ht 5' (1.524 m)   Wt 171 lb (77.6 kg)   BMI 33.40 kg/m  Constitutional:  Pleasant, in no acute distress. Psychiatric: Normal mood and affect. Behavior is normal. Cardiovascular: Normal rate, regular rhythm. No edema Pulmonary/chest: Effort normal and breath sounds normal. No wheezing, rales or rhonchi. Abdominal: Soft, nondistended, nontender. Bowel sounds active throughout. There are no masses palpable. No hepatomegaly. Neurological: Alert and oriented to person place and time. Skin: Skin is warm  and dry. No rashes noted.   ASSESSMENT AND PLAN;   1) GERD Longstanding history of GERD, well controlled on current therapy. - Resume Prilosec 40 mg/day - Resume Pepcid prn breakthrough - Continue antireflux lifestyle/diet modifications with avoidance of exacerbating foods  2) IBS-D 3) Increased flatus/Increased gas - Trial low FODMAP diet.  Provided with chart and detailed instructions today - Trial OTC simethicone such as Beano, Gas-X, etc. - If symptoms not improving, check fecal  pancreatic elastase and possibly repeat colonoscopy with random/directed biopsies to r/o [facility] -Continue avoidance of exacerbating type foods  4) Hepatic cyst CT in 05/2019 with 4.6 cm hepatic cyst and repeat CT in 3/thousand 22 with stable 4.8 cm cyst.  Liver otherwise normal appearing.  Normal liver enzymes. - Stable hepatic cyst on serial imaging studies.  Do not think additional imaging needed at this juncture  5) Family history of colon cancer (father) - Repeat colonoscopy in 2026 for ongoing screening/surveillance  RTC in 6 months or sooner prn    Lavena Bullion, DO, FACG  11/14/2021, 11:43 AM   Kerin Perna, NP

## 2021-11-14 NOTE — Patient Instructions (Signed)
Please follow up in 6 months. Give [place] a call at [phone] to schedule an appointment.   Take Gas-X, Beano as needed.  Due to recent changes in healthcare laws, you may see the results of your imaging and laboratory studies on MyChart before your provider has had a chance to review them.  We understand that in some cases there may be results that are confusing or concerning to you. Not all laboratory results come back in the same time frame and the provider may be waiting for multiple results in order to interpret others.  Please give [place] 48 hours in order for your provider to thoroughly review all the results before contacting the office for clarification of your results.     Thank you for choosing me and [facility].  Vito Cirigliano, D.O.  Low-FODMAP Eating Plan  FODMAP stands for fermentable oligosaccharides, disaccharides, monosaccharides, and polyols. These are sugars that are hard for some people to digest. A low-FODMAP eating plan may help some people who have irritable bowel syndrome (IBS) and certain other bowel (intestinal) diseases to manage their symptoms. This meal plan can be complicated to follow. Work with a diet and nutrition specialist (dietitian) to make a low-FODMAP eating plan that is right for you. A dietitian can help make sure that you get enough nutrition from this diet. What are tips for following this plan? Reading food labels Check labels for hidden FODMAPs such as: High-fructose syrup. Honey. Agave. Natural fruit flavors. Onion or garlic powder. Choose low-FODMAP foods that contain 3-4 grams of fiber per serving. Check food labels for serving sizes. Eat only one serving at a time to make sure FODMAP levels stay low. Shopping Shop with a list of foods that are recommended on this diet and make a meal plan. Meal planning Follow a low-FODMAP eating plan for up to 6 weeks, or as told by your health care provider or dietitian. To follow the eating  plan: Eliminate high-FODMAP foods from your diet completely. Choose only low-FODMAP foods to eat. You will do this for 2-6 weeks. Gradually reintroduce high-FODMAP foods into your diet one at a time. Most people should wait a few days before introducing the next new high-FODMAP food into their meal plan. Your dietitian can recommend how quickly you may reintroduce foods. Keep a daily record of what and how much you eat and drink. Make note of any symptoms that you have after eating. Review your daily record with a dietitian regularly to identify which foods you can eat and which foods you should avoid. General tips Drink enough fluid each day to keep your urine pale yellow. Avoid processed foods. These often have added sugar and may be high in FODMAPs. Avoid most dairy products, whole grains, and sweeteners. Work with a dietitian to make sure you get enough fiber in your diet. Avoid high FODMAP foods at meals to manage symptoms. Recommended foods Fruits Bananas, oranges, tangerines, lemons, limes, blueberries, raspberries, strawberries, grapes, cantaloupe, honeydew melon, kiwi, papaya, passion fruit, and pineapple. Limited amounts of dried cranberries, banana chips, and shredded coconut. Vegetables Eggplant, zucchini, cucumber, peppers, green beans, bean sprouts, lettuce, arugula, kale, Swiss chard, spinach, collard greens, bok choy, summer squash, potato, and tomato. Limited amounts of corn, carrot, and sweet potato. Green parts of scallions. Grains Gluten-free grains, such as rice, oats, buckwheat, quinoa, corn, polenta, and millet. Gluten-free pasta, bread, or cereal. Rice noodles. Corn tortillas. Meats and other proteins Unseasoned beef, pork, poultry, or fish. Eggs. Tomasa Blase. Tofu (firm) and tempeh.  Limited amounts of nuts and seeds, such as almonds, walnuts, [place] nuts, pecans, peanuts, nut butters, pumpkin seeds, chia seeds, and sunflower seeds. Dairy Lactose-free milk, yogurt, and kefir.  Lactose-free cottage cheese and ice cream. Non-dairy milks, such as almond, coconut, hemp, and rice milk. Non-dairy yogurt. Limited amounts of goat cheese, brie, mozzarella, parmesan, swiss, and other hard cheeses. Fats and oils Butter-free spreads. Vegetable oils, such as olive, canola, and sunflower oil. Seasoning and other foods Artificial sweeteners with names that do not end in "ol," such as aspartame, saccharine, and stevia. Maple syrup, white table sugar, raw sugar, brown sugar, and molasses. Mayonnaise, soy sauce, and tamari. Fresh basil, coriander, parsley, rosemary, and thyme. Beverages Water and mineral water. Sugar-sweetened soft drinks. Small amounts of orange juice or cranberry juice. Black and green tea. Most dry wines. Coffee. The items listed above may not be a complete list of foods and beverages you can eat. Contact a dietitian for more information. Foods to avoid Fruits Fresh, dried, and juiced forms of apple, pear, watermelon, peach, plum, cherries, apricots, blackberries, boysenberries, figs, nectarines, and mango. Avocado. Vegetables Chicory root, artichoke, asparagus, cabbage, snow peas, Brussels sprouts, broccoli, sugar snap peas, mushrooms, celery, and cauliflower. Onions, garlic, leeks, and the white part of scallions. Grains Wheat, including kamut, durum, and semolina. Barley and bulgur. Couscous. Wheat-based cereals. Wheat noodles, bread, crackers, and pastries. Meats and other proteins Fried or fatty meat. Sausage. Cashews and pistachios. Soybeans, baked beans, black beans, chickpeas, kidney beans, fava beans, navy beans, lentils, black-eyed peas, and split peas. Dairy Milk, yogurt, ice cream, and soft cheese. Cream and sour cream. Milk-based sauces. Custard. Buttermilk. Soy milk. Seasoning and other foods Any sugar-free gum or candy. Foods that contain artificial sweeteners such as sorbitol, mannitol, isomalt, or xylitol. Foods that contain honey, high-fructose corn  syrup, or agave. Bouillon, vegetable stock, beef stock, and chicken stock. Garlic and onion powder. Condiments made with onion, such as hummus, chutney, pickles, relish, salad dressing, and salsa. Tomato paste. Beverages Chicory-based drinks. Coffee substitutes. Chamomile tea. Fennel tea. Sweet or fortified wines such as port or sherry. Diet soft drinks made with isomalt, mannitol, maltitol, sorbitol, or xylitol. Apple, pear, and mango juice. Juices with high-fructose corn syrup. The items listed above may not be a complete list of foods and beverages you should avoid. Contact a dietitian for more information. Summary FODMAP stands for fermentable oligosaccharides, disaccharides, monosaccharides, and polyols. These are sugars that are hard for some people to digest. A low-FODMAP eating plan is a short-term diet that helps to ease symptoms of certain bowel diseases. The eating plan usually lasts up to 6 weeks. After that, high-FODMAP foods are reintroduced gradually and one at a time. This can help you find out which foods may be causing symptoms. A low-FODMAP eating plan can be complicated. It is best to work with a dietitian who has experience with this type of plan. This information is not intended to replace advice given to you by your health care provider. Make sure you discuss any questions you have with your health care provider. Document Revised: 05/27/2019 Document Reviewed: 05/27/2019 Elsevier Patient Education  [place].

## 2021-11-15 LAB — LIPID PANEL WITH LDL/HDL RATIO
Cholesterol, Total: 173 mg/dL (ref 100–199)
HDL: 48 mg/dL (ref >39)
LDL Chol Calc (NIH): 112 mg/dL — ABNORMAL HIGH (ref 0–99)
LDL/HDL Ratio: 2.3 ratio (ref 0.0–3.2)
Triglycerides: 65 mg/dL (ref 0–149)
VLDL Cholesterol Cal: 13 mg/dL (ref 5–40)

## 2021-11-15 LAB — LDL CHOLESTEROL, DIRECT: LDL Direct: 109 mg/dL — ABNORMAL HIGH (ref 0–99)

## 2021-11-23 ENCOUNTER — Encounter (INDEPENDENT_AMBULATORY_CARE_PROVIDER_SITE_OTHER): Payer: Commercial Managed Care - HMO | Admitting: Primary Care

## 2021-11-23 ENCOUNTER — Ambulatory Visit: Payer: Commercial Managed Care - HMO | Admitting: Cardiology

## 2021-11-26 ENCOUNTER — Ambulatory Visit: Payer: Commercial Managed Care - HMO | Admitting: Cardiology

## 2021-12-04 ENCOUNTER — Other Ambulatory Visit (HOSPITAL_COMMUNITY): Payer: Self-pay

## 2021-12-04 ENCOUNTER — Telehealth (HOSPITAL_COMMUNITY): Payer: Commercial Managed Care - HMO | Admitting: Psychiatry

## 2021-12-04 ENCOUNTER — Telehealth (HOSPITAL_COMMUNITY): Payer: Self-pay

## 2021-12-04 NOTE — Patient Instructions (Signed)
Thank you for attending your appointment today.  -- We did not make any medication changes today. Please continue medications as prescribed.  Please do not make any changes to medications without first discussing with your provider. If you are experiencing a psychiatric emergency, please call 911 or present to your nearest emergency department. Additional crisis, medication management, and therapy resources are included below.  [facility]  [address], [place], [place] [address] [phone] WALK-IN URGENT CARE 24/7 FOR ANYONE [address], [place], [place]  [phone] Fax: [phone] [email or web address] *Interpreters available *Accepts all insurance and uninsured for Urgent Care needs *Accepts Medicaid and uninsured for outpatient treatment (below)      ONLY FOR [place] Residents  Below:    Outpatient New Patient Assessment/Therapy Walk-ins:        Monday -Thursday 8am until slots are full.        Every Friday 1pm-4pm  (first come, first served)                   New Patient Psychiatry/Medication Management        Monday-Friday 8am-11am (first come, first served)               For all walk-ins we ask that you arrive by 7:15am, because patients will be seen in the order of arrival.   

## 2021-12-04 NOTE — Progress Notes (Unsigned)
BH MD Outpatient Progress Note  12/05/2021 12:05 PM Erin Pollard  MRN:  [id number]  Assessment:  Erin Pollard presents for follow-up evaluation. Today, 12/05/21, patient reports stability of mood although with mild exacerbation of anxiety in setting of recent stressors.  She endorses panic attacks about every other day for which she feels Klonopin has been helpful.  Extensively discussed risks of and lack of recommendation for long-term use; patient expresses understanding of risks and prefers to remain at current dosing for the time being however is amenable to exploring alternative methods of managing anxiety including referral to psychotherapy.  Will plan to explore past medication trials further at next visit and consider options for standing or as needed anxiolytics to assist in gradual taper of Klonopin.  No changes to plan of care at this time.  Plan to return to care in 2 months.  Identifying Information: Erin Pollard is a 51 y.o. female with a history of generalized anxiety disorder with panic attacks, HTN, past MI, and IBS who is an established patient with [facility] participating in follow-up via video conferencing.   Plan:  # GAD with panic attacks Past medication trials: Zoloft, Valium (nausea), patient reports "numerous others" that requires further exploration Status of problem: chronic with mild exacerbation Interventions: -- Continue mirtazapine 30 mg qHS -- Continue Klonopin 0.5 mg TID PRN panic attacks (patient using TID)  -- Risks of long-term use have been extensively discussed and patient expressed understanding and desire to continue  -- Discussed the ultimate goal of gradually tapering this medication as patient establishes in therapy and alternative options for medication management are explored  -- PDMP reviewed with appropriate filling -- Referral placed for individual psychotherapy  Patient was given contact information for  behavioral health clinic and was instructed to call 911 for emergencies.   Subjective:  Chief Complaint:  Chief Complaint  Patient presents with   Medication Management    Interval History:   Last seen by Erin Back, PA on 10/02/21. At that time, managed on: Mirtazapine 30 mg qHS Klonopin 0.5 mg TID PRN  Today, patient reports she has been having significant anxiety related to son being in a group home in [place] and being introduced to illicit drugs. Notes the holiday season typically worsens anxiety as well. Endorses panic attacks occur out of the blue characterized by intense overwhelm, heart racing, dizziness. Occur about every other day; using Klonopin three times a day - states she was told to take it scheduled by her cardiologist/previous mental health provider as it prevents rise in BP and keeps anxiety better controlled.   Mood has been a bit emotional as she is missing a friend who passed away from cancer but otherwise denies feeling persistently depressed. Denies SI, HI, AVH. States her family keeps her going.   Feels Remeron works well for her for sleep; sleeping about 8 hours nightly.   Has been in therapy in the past when in [place] but not currently; would be amenable to referral to therapist in our clinic.  Extensively discussed risks of long-term Klonopin use including dependence/tolerance, withdrawal, worsened anxiety, disrupted sleep architecture, and memory impairment and that indication is typically for short term use only.  She reports she has been on Klonopin for years and it is one of the few medications she has found helpful; reports when she has tried to go down to twice a day in the past her anxiety and blood pressure increased.  Provided education that  this was likely a manifestation of withdrawal from Klonopin.  Discussed goal to explore alternative methods of managing anxiety such as through therapy and alternative medications.  She acknowledges understanding of  the risks of Klonopin use and opts to remain at current dosing for time being as she establishes in therapy and as she establishes relationship with this provider.   PDMP: -- Klonopin 0.5 mg QTY 90 last filled 11/03/21 (rx dating Pollard to 2021)  Visit Diagnosis:    ICD-10-CM   1. Generalized anxiety disorder with panic attacks  F41.1 clonazePAM (KLONOPIN) 0.5 MG tablet   F41.0 mirtazapine (REMERON) 30 MG tablet      Past Psychiatric History:  Diagnoses: GAD with panic attacks Medication trials: Zoloft, Valium (nausea), patient reports "numerous others" that requires further exploration Hospitalizations: x1 in 1996 for suicide attempt Suicide attempts: x1 in 1997 via cutting Substance use: denies use of etoh, tobacco, or illicit drugs  Past Medical History:  Past Medical History:  Diagnosis Date   Anxiety    Cervical radiculopathy    GERD (gastroesophageal reflux disease)    Hypertension    Myocardial infarction (HCC)    Panic attack     Past Surgical History:  Procedure Laterality Date   LEFT HEART CATH     TUBAL LIGATION      Family Psychiatric History:  Reports 2 of her sons have struggled with substance use  Family History:  Family History  Problem Relation Age of Onset   Heart attack Mother    Cancer Father    Stroke Father    Stroke Sister    Heart disease Maternal Grandmother    Heart attack Maternal Grandmother    Heart disease Maternal Grandfather    Stroke Maternal Grandfather     Social History:  Social History   Socioeconomic History   Marital status: Divorced    Spouse name: Not on file   Number of children: Not on file   Years of education: Not on file   Highest education level: Not on file  Occupational History   Not on file  Tobacco Use   Smoking status: Former    Types: Cigarettes   Smokeless tobacco: Not on file  Vaping Use   Vaping Use: Never used  Substance and Sexual Activity   Alcohol use: Not Currently   Drug use: Never    Sexual activity: Yes  Other Topics Concern   Not on file  Social History Narrative   Not on file   Social Determinants of Health   Financial Resource Strain: Not on file  Food Insecurity: Not on file  Transportation Needs: Not on file  Physical Activity: Not on file  Stress: Not on file  Social Connections: Not on file    Allergies:  Allergies  Allergen Reactions   Amlodipine Hives    Achy legs and s.o.b Achy legs and s.o.b    Hydrocodone-Acetaminophen Hives and Rash   Iodinated Contrast Media Hives   Iodine Hives and Rash   Hydrocortisone-Iodoquinol    Propoxyphene     Current Medications: Current Outpatient Medications  Medication Sig Dispense Refill   atorvastatin (LIPITOR) 40 MG tablet Take 1 tablet (40 mg total) by mouth at bedtime. 30 tablet 1   cholecalciferol (VITAMIN D3) 25 MCG (1000 UNIT) tablet Take 1,000 Units by mouth daily.     clonazePAM (KLONOPIN) 0.5 MG tablet Take 1 tablet (0.5 mg total) by mouth 3 (three) times daily. 90 tablet 2   famotidine (PEPCID) 20 MG  tablet Take 20 mg by mouth as needed.     hydrALAZINE (APRESOLINE) 25 MG tablet Take 1 tablet (25 mg total) by mouth 3 (three) times daily. 90 tablet 1   hydrochlorothiazide (MICROZIDE) 12.5 MG capsule Take 1 capsule (12.5 mg total) by mouth daily. 90 capsule 1   Iron, Ferrous Sulfate, 325 (65 Fe) MG TABS Take 325 mg by mouth daily. 90 tablet 1   KLOR-CON M10 10 MEQ tablet Take 10 mEq by mouth daily.     labetalol (NORMODYNE) 200 MG tablet Take 1 tablet (200 mg total) by mouth daily. 90 tablet 1   loratadine (CLARITIN) 10 MG tablet Take 1 tablet (10 mg total) by mouth daily. 30 tablet 6   losartan (COZAAR) 50 MG tablet Take 1 tablet (50 mg total) by mouth daily. 90 tablet 1   methocarbamol (ROBAXIN) 500 MG tablet Take 1 tablet (500 mg total) by mouth 2 (two) times daily. 20 tablet 0   mirtazapine (REMERON) 30 MG tablet Take 1 tablet (30 mg total) by mouth at bedtime. 90 tablet 0   naproxen  (NAPROSYN) 500 MG tablet Take 1 tablet (500 mg total) by mouth 2 (two) times daily. 90 tablet 1   Omega-3 Fatty Acids (FISH OIL) 1000 MG CAPS Take 1 capsule by mouth daily.     omeprazole (PRILOSEC) 40 MG capsule Take 40 mg by mouth daily.     ondansetron (ZOFRAN-ODT) 4 MG disintegrating tablet Take 1 tablet (4 mg total) by mouth every 8 (eight) hours as needed for nausea or vomiting. 20 tablet 0   oxymetazoline (AFRIN NASAL SPRAY) 0.05 % nasal spray Place 1 spray into both nostrils 2 (two) times daily. 15 mL 0   potassium chloride (KLOR-CON 10) 10 MEQ tablet Take 1 tablet (10 mEq total) by mouth daily. 90 tablet 1   sucralfate (CARAFATE) 1 g tablet Take 1 tablet (1 g total) by mouth 4 (four) times daily -  with meals and at bedtime. 120 tablet 0   No current facility-administered medications for this visit.    ROS: Does not endorse any physical complaints  Objective:  Psychiatric Specialty Exam: There were no vitals taken for this visit.There is no height or weight on file to calculate BMI.  General Appearance: Casual and Well Groomed  Eye Contact:  Good  Speech:  Clear and Coherent and Normal Rate  Volume:  Normal  Mood:   "good but anxious"  Affect:   Euthymic, full range, bright  Thought Content:  Denies AVH; IOR; paranoia    Suicidal Thoughts:  No  Homicidal Thoughts:  No  Thought Process:  Goal Directed and Linear  Orientation:  Full (Time, Place, and Person)    Memory:   Grossly intact  Judgment:  Good  Insight:  Fair  Concentration:  Concentration: Good  Recall:  NA  Fund of Knowledge: Good  Language: Good  Psychomotor Activity:  Normal  Akathisia:  No  AIMS (if indicated): not done  Assets:  Communication Skills Desire for Improvement Housing Leisure Time Resilience Social Support Talents/Skills Transportation  ADL's:  Intact  Cognition: WNL  Sleep:  Good   PE: General: sits comfortably in view of camera; no acute distress  Pulm: no increased work of  breathing on room air  MSK: all extremity movements appear intact  Neuro: no focal neurological deficits observed  Gait & Station: unable to assess by video    Metabolic Disorder Labs: Lab Results  Component Value Date   HGBA1C 5.5 01/11/2021  No results found for: "PROLACTIN" Lab Results  Component Value Date   CHOL 173 11/14/2021   TRIG 65 11/14/2021   HDL 48 11/14/2021   CHOLHDL 5.0 09/14/2021   VLDL 11 09/14/2021   LDLCALC 112 (H) 11/14/2021   LDLCALC 154 (H) 09/19/2021   No results found for: "TSH"  Therapeutic Level Labs: No results found for: "LITHIUM" No results found for: "VALPROATE" No results found for: "CBMZ"  Screenings:  GAD-7    Flowsheet Row Video Visit from 10/02/2021 in [facility] Office Visit from 06/27/2021 in [facility] Office Visit from 04/19/2021 in [facility] Office Visit from 01/11/2021 in [facility]  Total GAD-7 Score 9 8 6  0      PHQ2-9    Flowsheet Row Video Visit from 10/02/2021 in [facility] Office Visit from 06/27/2021 in [facility] Office Visit from 04/19/2021 in [facility] Office Visit from 01/11/2021 in [facility]  PHQ-2 Total Score 1 1 1  0  PHQ-9 Total Score -- -- -- 0      Flowsheet Row Video Visit from 10/02/2021 in [facility] ED from 09/13/2021 in [facility] ED from 07/29/2021 in [facility] RISK CATEGORY No Risk No Risk No Risk       Collaboration of Care: Collaboration of Care: Medication Management AEB ongoing medication management, Psychiatrist AEB established with this provider, and Referral or follow-up with counselor/therapist AEB referral for individual psychotherapy  Patient/Guardian was advised Release of Information  must be obtained prior to any record release in order to collaborate their care with an outside provider. Patient/Guardian was advised if they have not already done so to contact the registration department to sign all necessary forms in order for [facility] to release information regarding their care.   Consent: Patient/Guardian gives verbal consent for treatment and assignment of benefits for services provided during this visit. Patient/Guardian expressed understanding and agreed to proceed.   Televisit via video: I connected with patient on 12/05/21 at 10:00 AM EST by a video enabled telemedicine application and verified that I am speaking with the correct person using two identifiers.  Location: Patient: Home address in [place] Provider: remote office in [place]   I discussed the limitations of evaluation and management by telemedicine and the availability of in person appointments. The patient expressed understanding and agreed to proceed.  I discussed the assessment and treatment plan with the patient. The patient was provided an opportunity to ask questions and all were answered. The patient agreed with the plan and demonstrated an understanding of the instructions.   The patient was advised to call [facility] or seek an in-person evaluation if the symptoms worsen or if the condition fails to improve as anticipated.  I provided 50 minutes of non-face-to-face time during this encounter.  Chellsea Beckers A Shatana Saxton 12/05/2021, 12:05 PM

## 2021-12-05 ENCOUNTER — Telehealth (INDEPENDENT_AMBULATORY_CARE_PROVIDER_SITE_OTHER): Payer: Commercial Managed Care - HMO | Admitting: Psychiatry

## 2021-12-05 ENCOUNTER — Encounter (HOSPITAL_COMMUNITY): Payer: Self-pay | Admitting: Psychiatry

## 2021-12-05 DIAGNOSIS — F411 Generalized anxiety disorder: Secondary | ICD-10-CM

## 2021-12-05 DIAGNOSIS — F410 Panic disorder [episodic paroxysmal anxiety] without agoraphobia: Secondary | ICD-10-CM | POA: Diagnosis not present

## 2021-12-05 MED ORDER — MIRTAZAPINE 30 MG PO TABS: 30.0000 mg | ORAL_TABLET | Freq: Every evening | ORAL | 0 refills | Status: DC

## 2021-12-05 MED ORDER — CLONAZEPAM 0.5 MG PO TABS: 0.5000 mg | ORAL_TABLET | Freq: Three times a day (TID) | ORAL | 2 refills | Status: DC

## 2021-12-10 ENCOUNTER — Telehealth (HOSPITAL_COMMUNITY): Payer: Self-pay

## 2021-12-11 ENCOUNTER — Encounter (INDEPENDENT_AMBULATORY_CARE_PROVIDER_SITE_OTHER): Payer: Self-pay | Admitting: Primary Care

## 2021-12-11 ENCOUNTER — Ambulatory Visit (INDEPENDENT_AMBULATORY_CARE_PROVIDER_SITE_OTHER): Payer: Commercial Managed Care - HMO | Admitting: Primary Care

## 2021-12-11 ENCOUNTER — Ambulatory Visit: Payer: Commercial Managed Care - HMO

## 2021-12-11 VITALS — BP 142/85 | HR 71 | Resp 16 | Ht 60.0 in | Wt 170.2 lb

## 2021-12-11 VITALS — BP 147/87 | HR 80 | Resp 17 | Ht 60.0 in | Wt 171.6 lb

## 2021-12-11 DIAGNOSIS — H0263 Xanthelasma of right eye, unspecified eyelid: Secondary | ICD-10-CM

## 2021-12-11 DIAGNOSIS — I10 Essential (primary) hypertension: Secondary | ICD-10-CM | POA: Diagnosis not present

## 2021-12-11 DIAGNOSIS — Z1159 Encounter for screening for other viral diseases: Secondary | ICD-10-CM | POA: Diagnosis not present

## 2021-12-11 DIAGNOSIS — Z87891 Personal history of nicotine dependence: Secondary | ICD-10-CM

## 2021-12-11 DIAGNOSIS — Z114 Encounter for screening for human immunodeficiency virus [HIV]: Secondary | ICD-10-CM | POA: Diagnosis not present

## 2021-12-11 DIAGNOSIS — Z131 Encounter for screening for diabetes mellitus: Secondary | ICD-10-CM

## 2021-12-11 DIAGNOSIS — E7800 Pure hypercholesterolemia, unspecified: Secondary | ICD-10-CM

## 2021-12-11 DIAGNOSIS — Z0000 Encounter for general adult medical examination without abnormal findings: Secondary | ICD-10-CM | POA: Diagnosis not present

## 2021-12-11 NOTE — Progress Notes (Signed)
Date:  12/11/2021   ID:  [name], DOB 1971-01-09, MRN [id number]  PCP:  Kerin Perna, NP  Cardiologist:  Ernst Spell, DO, [facility] (established care February 01, 2021) Former Cardiology Providers: Dr. Delfina Redwood [facility], [place])  Date: 12/11/21 Last Office Visit: 03/15/2021  Chief Complaint  Patient presents with   Hypertension   Hyperlipidemia   Follow-up    6 month    HPI  Erin Pollard is a 51 y.o. African-American female whose past medical history and cardiovascular risk factors include: Panic attack, anxiety, history of myocardial infarction (04/2015) no intervention, family history of premature CAD, benign essential hypertension, GERD, former smoker, obesity due to excess calories.   Given her precordial pain she underwent treadmill stress test which was abnormal and subsequently since last visit has undergone exercise nuclear stress test.  Results were independently reviewed with her at today's office visit and noted below for further reference.  Clinically she has not had any chest pain or shortness of breath since improvement in her blood pressures.  Patient was noted to have xanthelasmas on physical examination during her initial consultation back in January 2023.  At that time her total cholesterol was 223 and LDL 158 mg/dL.  She has never been on cholesterol medications prior to that.  I started her on atorvastatin 40 mg p.o. nightly with intentions of up titration of medical therapy based on her response.  However, due to financial constraints she has not had any follow-up labs to reevaluate lipids or LFTs.  The importance of this was discussed again at today's visit.  Clinically her xanthelasmas have improved and are less noticeable but still present.  She is very happy with the progress.   Family history of premature CAD with mother having a myocardial infarction at the age of 60 and passed.  She presents today for 32-month follow-up.  At last  office visit LDLs were still elevated therefore she was started on atorvastatin 40 mg nightly however she stopped taking this due to unknown reasons and was restarted on Crestor.  However, she did not want to continue on Crestor and preferred to switch back to atorvastatin.  Aside from these changes, she was overall stable from a cardiovascular standpoint. She is doing well today without any complaints.  She is continuing to work on dietary changes and incorporating exercise as tolerated. She denies chest pain, shortness of breath, palpitations, leg edema, orthopnea, PND, TIA/syncope.  FUNCTIONAL STATUS: No structured exercise program or daily routine.   ALLERGIES: Allergies  Allergen Reactions   Amlodipine Hives    Achy legs and s.o.b Achy legs and s.o.b    Hydrocodone-Acetaminophen Hives and Rash   Iodinated Contrast Media Hives   Iodine Hives and Rash   Codeine    Hydrocortisone-Iodoquinol    Propoxyphene     MEDICATION LIST PRIOR TO VISIT: Current Meds  Medication Sig   atorvastatin (LIPITOR) 40 MG tablet Take 1 tablet (40 mg total) by mouth at bedtime.   cholecalciferol (VITAMIN D3) 25 MCG (1000 UNIT) tablet Take 1,000 Units by mouth daily.   clonazePAM (KLONOPIN) 0.5 MG tablet Take 1 tablet (0.5 mg total) by mouth 3 (three) times daily.   famotidine (PEPCID) 20 MG tablet Take 20 mg by mouth as needed.   hydrALAZINE (APRESOLINE) 25 MG tablet Take 1 tablet (25 mg total) by mouth 3 (three) times daily.   hydrochlorothiazide (MICROZIDE) 12.5 MG capsule Take 1 capsule (12.5 mg total) by mouth daily.   Iron,  Ferrous Sulfate, 325 (65 Fe) MG TABS Take 325 mg by mouth daily.   KLOR-CON M10 10 MEQ tablet Take 10 mEq by mouth daily.   labetalol (NORMODYNE) 200 MG tablet Take 1 tablet (200 mg total) by mouth daily.   loratadine (CLARITIN) 10 MG tablet Take 1 tablet (10 mg total) by mouth daily.   losartan (COZAAR) 50 MG tablet Take 1 tablet (50 mg total) by mouth daily.   methocarbamol  (ROBAXIN) 500 MG tablet Take 1 tablet (500 mg total) by mouth 2 (two) times daily.   mirtazapine (REMERON) 30 MG tablet Take 1 tablet (30 mg total) by mouth at bedtime.   naproxen (NAPROSYN) 500 MG tablet Take 1 tablet (500 mg total) by mouth 2 (two) times daily.   Omega-3 Fatty Acids (FISH OIL) 1000 MG CAPS Take 1 capsule by mouth daily.   ondansetron (ZOFRAN-ODT) 4 MG disintegrating tablet Take 1 tablet (4 mg total) by mouth every 8 (eight) hours as needed for nausea or vomiting.   oxymetazoline (AFRIN NASAL SPRAY) 0.05 % nasal spray Place 1 spray into both nostrils 2 (two) times daily.   potassium chloride (KLOR-CON 10) 10 MEQ tablet Take 1 tablet (10 mEq total) by mouth daily.   sucralfate (CARAFATE) 1 g tablet Take 1 tablet (1 g total) by mouth 4 (four) times daily -  with meals and at bedtime.     PAST MEDICAL HISTORY: Past Medical History:  Diagnosis Date   Anxiety    Cervical radiculopathy    GERD (gastroesophageal reflux disease)    Hypertension    Myocardial infarction ([place])    Panic attack     PAST SURGICAL HISTORY: Past Surgical History:  Procedure Laterality Date   LEFT HEART CATH     TUBAL LIGATION      FAMILY HISTORY: The patient family history includes Cancer in her father; Heart attack in her maternal grandmother and mother; Heart disease in her maternal grandfather and maternal grandmother; Stroke in her father, maternal grandfather, and sister.  SOCIAL HISTORY:  The patient  reports that she quit smoking about 6 years ago. Her smoking use included cigarettes. She does not have any smokeless tobacco history on file. She reports that she does not currently use alcohol. She reports that she does not use drugs.  REVIEW OF SYSTEMS: Review of Systems  Cardiovascular:  Negative for chest pain, dyspnea on exertion, leg swelling, orthopnea, palpitations, paroxysmal nocturnal dyspnea and syncope.  Respiratory:  Negative for shortness of breath.     PHYSICAL EXAM:     12/11/2021    1:47 PM 11/14/2021   11:11 AM 09/14/2021    4:40 AM  Vitals with BMI  Height 5\' 0"  5\' 0"    Weight 171 lbs 10 oz 171 lbs   BMI [id number]   Systolic [id number]  Diastolic 87 72 71  Pulse 80 81 76   Physical Exam Cardiovascular:     Rate and Rhythm: Normal rate and regular rhythm.     Pulses: Normal pulses.          Carotid pulses are 2+ on the right side and 2+ on the left side.      Radial pulses are 2+ on the right side and 2+ on the left side.       Dorsalis pedis pulses are 2+ on the right side and 2+ on the left side.     Heart sounds: Normal heart sounds. No murmur heard.    No gallop.  Pulmonary:  Effort: Pulmonary effort is normal. No respiratory distress.     Breath sounds: Normal breath sounds. No wheezing.  Musculoskeletal:     Right lower leg: No edema.     Left lower leg: No edema.    RADIOLOGY DATABASE: Chest x-ray 11/13/2020: No active cardiopulmonary disease.  CARDIAC DATABASE: EKG 12/11/2021: Normal sinus rhythm at rate of 76 bpm.  Left axis.  Left atrial enlargement.  No evidence of ischemia or underlying injury pattern.  Compared to previous EKG on 02/01/2021, no significant change.  Echocardiogram: 04/2016 [facility] available in Care Everywhere: LVEF 60 to 65%, normal diastolic filling pattern, normal right ventricular size and function, normal left atrial size, no significant valvular heart disease.  02/23/2021: Normal LV systolic function with visual EF 60-65%. Left ventricle cavity is normal in size. Normal left ventricular wall thickness. Normal global wall motion. Normal diastolic filling pattern, normal LAP. No significant valvular heart disease. Anechoic structure within the liver parenchyma measuring 3.34 x 4.05cm likely a cyst, consider dedicated ultrasound of abdomen if clinically indicated.  Compared to outside study 04/2016 no significant change.   Stress Testing: Exercise treadmill stress test  02/23/2021: Functional status: Fair. Chest pain: No. Reason for stopping exercise: Dyspnea, patient request. Hypertensive response to exercise: No. Exercise time 5 minutes 59 seconds on Bruce protocol, achieved 7.05 METS, 88% of age-predicted maximum heart rate (APMHR).  Stress ECG positive for ischemia.  Intermediate risk study. Consider further cardiac work up if clinically indicated. Clinical correlation required.   Exercise nuclear stress test 04/02/2021: Normal ECG stress. The patient exercised for 5 minutes and 0 seconds of a Bruce protocol, achieving approximately 7.05 METs. Exercise capacity reduced. The blood pressure response was normal. Myocardial perfusion is normal. Overall LV systolic function is normal without regional wall motion abnormalities. Stress LV EF: 69%.  No previous exam available for comparison. Low risk.  Heart Catheterization: Last left heart catheterization April 2017 per patient.  We will request records.  LABORATORY DATA:    Latest Ref Rng & Units 09/14/2021    1:28 AM 06/10/2021    1:15 AM 04/20/2021    8:42 PM  CBC  WBC 4.0 - 10.5 K/uL 5.2  5.7  6.0   Hemoglobin 12.0 - 15.0 g/dL 37.1  69.6  78.9   Hematocrit 36.0 - 46.0 % 36.0  38.4  36.4   Platelets 150 - 400 K/uL 316  313  338        Latest Ref Rng & Units 09/19/2021    4:35 PM 09/14/2021    3:33 AM 06/10/2021    1:15 AM  CMP  Glucose 70 - 99 mg/dL 81  381  89   BUN 6 - 24 mg/dL 8  9  9    Creatinine 0.57 - 1.00 mg/dL  0.17  5.10   Sodium 134 - 144 mmol/L 140  140  140   Potassium 3.5 - 5.2 mmol/L 3.8  3.4  3.3   Chloride 96 - 106 mmol/L 100  107  104   CO2 20 - 29 mmol/L 24  26  29    Calcium 8.7 - 10.2 mg/dL 9.5  8.9  9.4   Total Protein 6.0 - 8.5 g/dL 7.2  6.9  8.0   Total Bilirubin 0.0 - 1.2 mg/dL 0.3  0.5  0.5   Alkaline Phos 44 - 121 IU/L 101  78  75   AST 0 - 40 IU/L 12  13  16    ALT 0 -  32 IU/L 13  15  16      Lipid Panel     Component Value Date/Time   CHOL 173  11/14/2021 1231   TRIG 65 11/14/2021 1231   HDL 48 11/14/2021 1231   CHOLHDL 5.0 09/14/2021 0128   VLDL 11 09/14/2021 0128   LDLCALC 112 (H) 11/14/2021 1231   LDLDIRECT 109 (H) 11/14/2021 1234   LDLDIRECT 169 (H) 09/14/2021 0333   LABVLDL 13 11/14/2021 1231    BMP Recent Labs    04/20/21 2042 06/10/21 0115 09/14/21 0333 09/19/21 1635  NA 138 140 140 140  K 3.2* 3.3* 3.4* 3.8  CL 100 104 107 100  CO2 30 29 26 24   GLUCOSE 100* 89 100* 81  BUN 9 9 9 8   CREATININE 0.65 0.60 0.65 0.69  CALCIUM 9.6 9.4 8.9 9.5  GFRNONAA >60 >60 >60  --     HEMOGLOBIN A1C Lab Results  Component Value Date   HGBA1C 5.5 01/11/2021    IMPRESSION:    ICD-10-CM   1. Benign hypertension  I10 EKG 12-Lead    2. Pure hypercholesterolemia  E78.00     3. Xanthelasma of eyelid, bilateral  H02.63    H02.66     4. Former smoker  Z87.891        RECOMMENDATIONS: MODELL EISAMAN is a 51 y.o. African-American female whose past medical history and cardiac risk factors include: Panic attack, anxiety, history of myocardial infarction (04/2015) no intervention, family history of premature CAD, benign essential hypertension, GERD, former smoker, obesity due to excess calories.   Pure hypercholesterolemia / Xanthelasma of eyelid, bilateral Initially her was LDL 158 mg/dL with findings of xanthelasmas bilaterally and family history of premature CAD. Continues on atorvastatin 40 mg p.o. nightly.  She is seeing her PCP today and will have labs checked. She would like to continue only on Atorvastatin and not add other medications for cholesterol.  Benign hypertension Home blood pressures are improving. Continue current medical therapy. Reemphasized the importance of low-salt diet.  Former smoker Educated on the importance of continued smoking cessation.   As part of today's office visit discussed management of at least 2 chronic comorbid conditions, independently reviewed her blood pressure log at  today's visit as well as nuclear stress test results.  Patient is encouraged to have follow-up lipids and LFTs checked.  She is also waiting insurance to undergo abdominal ultrasound.  FINAL MEDICATION LIST END OF ENCOUNTER: No orders of the defined types were placed in this encounter.   Medications Discontinued During This Encounter  Medication Reason   omeprazole (PRILOSEC) 40 MG capsule       Current Outpatient Medications:    atorvastatin (LIPITOR) 40 MG tablet, Take 1 tablet (40 mg total) by mouth at bedtime., Disp: 30 tablet, Rfl: 1   cholecalciferol (VITAMIN D3) 25 MCG (1000 UNIT) tablet, Take 1,000 Units by mouth daily., Disp: , Rfl:    clonazePAM (KLONOPIN) 0.5 MG tablet, Take 1 tablet (0.5 mg total) by mouth 3 (three) times daily., Disp: 90 tablet, Rfl: 2   famotidine (PEPCID) 20 MG tablet, Take 20 mg by mouth as needed., Disp: , Rfl:    hydrALAZINE (APRESOLINE) 25 MG tablet, Take 1 tablet (25 mg total) by mouth 3 (three) times daily., Disp: 90 tablet, Rfl: 1   hydrochlorothiazide (MICROZIDE) 12.5 MG capsule, Take 1 capsule (12.5 mg total) by mouth daily., Disp: 90 capsule, Rfl: 1   Iron, Ferrous Sulfate, 325 (65 Fe) MG TABS, Take 325 mg by  mouth daily., Disp: 90 tablet, Rfl: 1   KLOR-CON M10 10 MEQ tablet, Take 10 mEq by mouth daily., Disp: , Rfl:    labetalol (NORMODYNE) 200 MG tablet, Take 1 tablet (200 mg total) by mouth daily., Disp: 90 tablet, Rfl: 1   loratadine (CLARITIN) 10 MG tablet, Take 1 tablet (10 mg total) by mouth daily., Disp: 30 tablet, Rfl: 6   losartan (COZAAR) 50 MG tablet, Take 1 tablet (50 mg total) by mouth daily., Disp: 90 tablet, Rfl: 1   methocarbamol (ROBAXIN) 500 MG tablet, Take 1 tablet (500 mg total) by mouth 2 (two) times daily., Disp: 20 tablet, Rfl: 0   mirtazapine (REMERON) 30 MG tablet, Take 1 tablet (30 mg total) by mouth at bedtime., Disp: 90 tablet, Rfl: 0   naproxen (NAPROSYN) 500 MG tablet, Take 1 tablet (500 mg total) by mouth 2 (two) times  daily., Disp: 90 tablet, Rfl: 1   Omega-3 Fatty Acids (FISH OIL) 1000 MG CAPS, Take 1 capsule by mouth daily., Disp: , Rfl:    ondansetron (ZOFRAN-ODT) 4 MG disintegrating tablet, Take 1 tablet (4 mg total) by mouth every 8 (eight) hours as needed for nausea or vomiting., Disp: 20 tablet, Rfl: 0   oxymetazoline (AFRIN NASAL SPRAY) 0.05 % nasal spray, Place 1 spray into both nostrils 2 (two) times daily., Disp: 15 mL, Rfl: 0   potassium chloride (KLOR-CON 10) 10 MEQ tablet, Take 1 tablet (10 mEq total) by mouth daily., Disp: 90 tablet, Rfl: 1   sucralfate (CARAFATE) 1 g tablet, Take 1 tablet (1 g total) by mouth 4 (four) times daily -  with meals and at bedtime., Disp: 120 tablet, Rfl: 0  Orders Placed This Encounter  Procedures   EKG 12-Lead    There are no Patient Instructions on file for this visit.   --Continue cardiac medications as reconciled in final medication list. --Return in about 6 months (around 06/11/2022) for HTN, HLD. Or sooner if needed. --Continue follow-up with your primary care physician regarding the management of your other chronic comorbid conditions.  Patient's questions and concerns were addressed to her satisfaction. She voices understanding of the instructions provided during this encounter.   This note was created using a voice recognition software as a result there may be grammatical errors inadvertently enclosed that do not reflect the nature of this encounter. Every attempt is made to correct such errors.    Ernst Spell, [place] Pager: [phone] Office: [phone]

## 2021-12-11 NOTE — Progress Notes (Signed)
[place]  [name] is a 51 y.o. female presents to office today for annual physical exam examination.    Concerns today include: 1. Cervical pain   Occupation: N/A, Marital status: D, Substance use: No Diet: low sodium , Exercise: Yes  Health Maintenance  Topic Date Due   COVID-19 Vaccine (1) Never done   Hepatitis C Screening  Never done   Zoster Vaccines- Shingrix (1 of 2) 03/13/2022 (Originally 07/16/2018)   INFLUENZA VACCINE  04/21/2022 (Originally 08/21/2021)   MAMMOGRAM  07/31/2023   PAP SMEAR-Modifier  07/30/2024   COLONOSCOPY (Pts 45-4yr Insurance coverage will need to be confirmed)  03/18/2029   HIV Screening  Completed   HPV VACCINES  Aged Out     Past Medical History:  Diagnosis Date   Anxiety    Cervical radiculopathy    GERD (gastroesophageal reflux disease)    Hypertension    [place]ocardial infarction [place]    Panic attack    Social History   Socioeconomic History   Marital status: Divorced    Spouse name: Not on file   Number of children: Not on file   Years of education: Not on file   Highest education level: Not on file  Occupational History   Not on file  Tobacco Use   Smoking status: Former    Types: Cigarettes    Quit date: 2017    Years since quitting: 6.8   Smokeless tobacco: Not on file  Vaping Use   Vaping Use: Never used  Substance and Sexual Activity   Alcohol use: Not Currently   Drug use: Never   Sexual activity: Yes  Other Topics Concern   Not on file  Social History Narrative   Not on file   Social Determinants of Health   Financial Resource Strain: Not on file  Food Insecurity: Not on file  Transportation Needs: Not on file  Physical Activity: Not on file  Stress: Not on file  Social Connections: Not on file  Intimate Partner Violence: Not on file   Past Surgical History:  Procedure Laterality Date   LEFT HEART CATH     TUBAL LIGATION     Family History  Problem Relation Age of Onset    Heart attack Mother    Cancer Father    Stroke Father    Stroke Sister    Heart disease Maternal Grandmother    Heart attack Maternal Grandmother    Heart disease Maternal Grandfather    Stroke Maternal Grandfather     Current Outpatient Medications:    atorvastatin (LIPITOR) 40 MG tablet, Take 1 tablet (40 mg total) by mouth at bedtime., Disp: 30 tablet, Rfl: 1   cholecalciferol (VITAMIN D3) 25 MCG (1000 UNIT) tablet, Take 1,000 Units by mouth daily., Disp: , Rfl:    clonazePAM (KLONOPIN) 0.5 MG tablet, Take 1 tablet (0.5 mg total) by mouth 3 (three) times daily., Disp: 90 tablet, Rfl: 2   famotidine (PEPCID) 20 MG tablet, Take 20 mg by mouth as needed., Disp: , Rfl:    hydrALAZINE (APRESOLINE) 25 MG tablet, Take 1 tablet (25 mg total) by mouth 3 (three) times daily., Disp: 90 tablet, Rfl: 1   hydrochlorothiazide (MICROZIDE) 12.5 MG capsule, Take 1 capsule (12.5 mg total) by mouth daily., Disp: 90 capsule, Rfl: 1   Iron, Ferrous Sulfate, 325 (65 Fe) MG TABS, Take 325 mg by mouth daily., Disp: 90 tablet, Rfl: 1   KLOR-CON M10 10 MEQ tablet, Take 10 mEq by  mouth daily., Disp: , Rfl:    labetalol (NORMODYNE) 200 MG tablet, Take 1 tablet (200 mg total) by mouth daily., Disp: 90 tablet, Rfl: 1   loratadine (CLARITIN) 10 MG tablet, Take 1 tablet (10 mg total) by mouth daily., Disp: 30 tablet, Rfl: 6   losartan (COZAAR) 50 MG tablet, Take 1 tablet (50 mg total) by mouth daily., Disp: 90 tablet, Rfl: 1   methocarbamol (ROBAXIN) 500 MG tablet, Take 1 tablet (500 mg total) by mouth 2 (two) times daily., Disp: 20 tablet, Rfl: 0   mirtazapine (REMERON) 30 MG tablet, Take 1 tablet (30 mg total) by mouth at bedtime., Disp: 90 tablet, Rfl: 0   naproxen (NAPROSYN) 500 MG tablet, Take 1 tablet (500 mg total) by mouth 2 (two) times daily., Disp: 90 tablet, Rfl: 1   Omega-3 Fatty Acids (FISH OIL) 1000 MG CAPS, Take 1 capsule by mouth daily., Disp: , Rfl:    ondansetron (ZOFRAN-ODT) 4 MG disintegrating  tablet, Take 1 tablet (4 mg total) by mouth every 8 (eight) hours as needed for nausea or vomiting., Disp: 20 tablet, Rfl: 0   oxymetazoline (AFRIN NASAL SPRAY) 0.05 % nasal spray, Place 1 spray into both nostrils 2 (two) times daily., Disp: 15 mL, Rfl: 0   potassium chloride (KLOR-CON 10) 10 MEQ tablet, Take 1 tablet (10 mEq total) by mouth daily., Disp: 90 tablet, Rfl: 1   sucralfate (CARAFATE) 1 g tablet, Take 1 tablet (1 g total) by mouth 4 (four) times daily -  with meals and at bedtime., Disp: 120 tablet, Rfl: 0 Outpatient Encounter Medications as of 12/11/2021  Medication Sig   atorvastatin (LIPITOR) 40 MG tablet Take 1 tablet (40 mg total) by mouth at bedtime.   cholecalciferol (VITAMIN D3) 25 MCG (1000 UNIT) tablet Take 1,000 Units by mouth daily.   clonazePAM (KLONOPIN) 0.5 MG tablet Take 1 tablet (0.5 mg total) by mouth 3 (three) times daily.   famotidine (PEPCID) 20 MG tablet Take 20 mg by mouth as needed.   hydrALAZINE (APRESOLINE) 25 MG tablet Take 1 tablet (25 mg total) by mouth 3 (three) times daily.   hydrochlorothiazide (MICROZIDE) 12.5 MG capsule Take 1 capsule (12.5 mg total) by mouth daily.   Iron, Ferrous Sulfate, 325 (65 Fe) MG TABS Take 325 mg by mouth daily.   KLOR-CON M10 10 MEQ tablet Take 10 mEq by mouth daily.   labetalol (NORMODYNE) 200 MG tablet Take 1 tablet (200 mg total) by mouth daily.   loratadine (CLARITIN) 10 MG tablet Take 1 tablet (10 mg total) by mouth daily.   losartan (COZAAR) 50 MG tablet Take 1 tablet (50 mg total) by mouth daily.   methocarbamol (ROBAXIN) 500 MG tablet Take 1 tablet (500 mg total) by mouth 2 (two) times daily.   mirtazapine (REMERON) 30 MG tablet Take 1 tablet (30 mg total) by mouth at bedtime.   naproxen (NAPROSYN) 500 MG tablet Take 1 tablet (500 mg total) by mouth 2 (two) times daily.   Omega-3 Fatty Acids (FISH OIL) 1000 MG CAPS Take 1 capsule by mouth daily.   ondansetron (ZOFRAN-ODT) 4 MG disintegrating tablet Take 1 tablet (4  mg total) by mouth every 8 (eight) hours as needed for nausea or vomiting.   oxymetazoline (AFRIN NASAL SPRAY) 0.05 % nasal spray Place 1 spray into both nostrils 2 (two) times daily.   potassium chloride (KLOR-CON 10) 10 MEQ tablet Take 1 tablet (10 mEq total) by mouth daily.   sucralfate (CARAFATE) 1 g tablet  Take 1 tablet (1 g total) by mouth 4 (four) times daily -  with meals and at bedtime.   No facility-administered encounter medications on file as of 12/11/2021.    Allergies  Allergen Reactions   Amlodipine Hives    Achy legs and s.o.b Achy legs and s.o.b    Hydrocodone-Acetaminophen Hives and Rash   Iodinated Contrast Media Hives   Iodine Hives and Rash   Codeine    Hydrocortisone-Iodoquinol    Propoxyphene      ROS: Review of Systems Pertinent items are noted in HPI.    Physical exam BP (!) 142/85   Pulse 71   Resp 16   Ht 5' (1.524 m)   Wt 170 lb 3.2 oz (77.2 kg)   SpO2 97%   BMI 33.24 kg/m  General appearance: alert, appears stated age, and mildly obese Head: Normocephalic, without obvious abnormality, atraumatic Eyes: conjunctivae/corneas clear. PERRL, EOM's intact. Fundi benign. Ears: normal TM's and external ear canals both ears Nose: Nares normal. Septum midline. Mucosa normal. No drainage or sinus tenderness. Neck: no adenopathy, no carotid bruit, no JVD, supple, symmetrical, trachea midline, and thyroid not enlarged, symmetric, no tenderness/mass/nodules Back: symmetric, no curvature. ROM normal. No CVA tenderness. Lungs: clear to auscultation bilaterally Heart: regular rate and rhythm, S1, S2 normal, no murmur, click, rub or gallop Abdomen: soft, non-tender; bowel sounds normal; no masses,  no organomegaly Pelvic: cervix normal in appearance, external genitalia normal, no adnexal masses or tenderness, no cervical motion tenderness, rectovaginal septum normal, uterus normal size, shape, and consistency, and vagina normal without discharge Extremities:  extremities normal, atraumatic, no cyanosis or edema Skin: Skin color, texture, turgor normal. No rashes or lesions Lymph nodes: Cervical, supraclavicular, and axillary nodes normal. Neurologic: Alert and oriented X 3, normal strength and tone. Normal symmetric reflexes. Normal coordination and gait   Erin Pollard was seen today for annual exam.  Diagnoses and all orders for this visit:  Encounter for HCV screening test for low risk patient -     HCV Ab w Reflex to Quant PCR  Essential hypertension -     CMP14+EGFR Followed by cardiology   Annual physical exam  Encounter for screening for HIV -     HIV Antibody (routine testing w rflx)  Diabetes mellitus screening -     Hemoglobin A1c    Assessment/ Plan: [name] here for annual physical exam.   No problem-specific Assessment & Plan notes found for this encounter.   Counseled on healthy lifestyle choices, including diet (rich in fruits, vegetables and lean meats and low in salt and simple carbohydrates) and exercise (at least 30 minutes of moderate physical activity daily).  Patient to follow up in 1 year for annual exam or sooner if needed.  The above assessment and management plan was discussed with the patient. The patient verbalized understanding of and has agreed to the management plan. Patient is aware to call the clinic if symptoms persist or worsen. Patient is aware when to return to the clinic for a follow-up visit. Patient educated on when it is appropriate to go to the emergency department.   This note has been created with Surveyor, quantity. Any transcriptional errors are unintentional.   Kerin Perna, NP 12/11/2021, 3:48 PM

## 2021-12-12 ENCOUNTER — Telehealth (INDEPENDENT_AMBULATORY_CARE_PROVIDER_SITE_OTHER): Payer: Self-pay | Admitting: Primary Care

## 2021-12-12 LAB — CMP14+EGFR
ALT: 23 IU/L (ref 0–32)
AST: 18 IU/L (ref 0–40)
Albumin/Globulin Ratio: 1.8 (ref 1.2–2.2)
Albumin: 4.6 g/dL (ref 3.8–4.9)
Alkaline Phosphatase: 102 IU/L (ref 44–121)
BUN/Creatinine Ratio: 13 (ref 9–23)
BUN: 9 mg/dL (ref 6–24)
Bilirubin Total: 0.4 mg/dL (ref 0.0–1.2)
CO2: 25 mmol/L (ref 20–29)
Calcium: 9.6 mg/dL (ref 8.7–10.2)
Chloride: 102 mmol/L (ref 96–106)
Creatinine, Ser: 0.68 mg/dL (ref 0.57–1.00)
Globulin, Total: 2.6 g/dL (ref 1.5–4.5)
Glucose: 82 mg/dL (ref 70–99)
Potassium: 3.9 mmol/L (ref 3.5–5.2)
Sodium: 141 mmol/L (ref 134–144)
Total Protein: 7.2 g/dL (ref 6.0–8.5)
eGFR: 104 mL/min/{1.73_m2} (ref >59)

## 2021-12-12 LAB — CBC WITH DIFFERENTIAL/PLATELET
Basophils Absolute: 0 10*3/uL (ref 0.0–0.2)
Basos: 0 %
EOS (ABSOLUTE): 0.1 10*3/uL (ref 0.0–0.4)
Eos: 2 %
Hematocrit: 36.7 % (ref 34.0–46.6)
Hemoglobin: 12 g/dL (ref 11.1–15.9)
Immature Grans (Abs): 0 10*3/uL (ref 0.0–0.1)
Immature Granulocytes: 0 %
Lymphocytes Absolute: 1.9 10*3/uL (ref 0.7–3.1)
Lymphs: 33 %
MCH: 25.9 pg — ABNORMAL LOW (ref 26.6–33.0)
MCHC: 32.7 g/dL (ref 31.5–35.7)
MCV: 79 fL (ref 79–97)
Monocytes Absolute: 0.5 10*3/uL (ref 0.1–0.9)
Monocytes: 9 %
Neutrophils Absolute: 3.2 10*3/uL (ref 1.4–7.0)
Neutrophils: 56 %
Platelets: 310 10*3/uL (ref 150–450)
RBC: 4.64 x10E6/uL (ref 3.77–5.28)
RDW: 14 % (ref 11.7–15.4)
WBC: 5.8 10*3/uL (ref 3.4–10.8)

## 2021-12-12 LAB — HIV ANTIBODY (ROUTINE TESTING W REFLEX): HIV Screen 4th Generation wRfx: NONREACTIVE

## 2021-12-12 LAB — HCV INTERPRETATION

## 2021-12-12 LAB — HEMOGLOBIN A1C
Est. average glucose Bld gHb Est-mCnc: 114 mg/dL
Hgb A1c MFr Bld: 5.6 % (ref 4.8–5.6)

## 2021-12-12 LAB — HCV AB W REFLEX TO QUANT PCR: HCV Ab: NONREACTIVE

## 2021-12-12 NOTE — Telephone Encounter (Signed)
Pt requests Rx for Ibuprofen 600 mg for neck pain. Pt stated the Rx was suppose to have been sent after her appt yesterday. [id number] [place], [place] - [address] Phone: [phone]  Fax: [phone]

## 2021-12-12 NOTE — Telephone Encounter (Signed)
Will forward to provider  

## 2021-12-17 ENCOUNTER — Telehealth (HOSPITAL_COMMUNITY): Payer: Self-pay

## 2021-12-20 ENCOUNTER — Ambulatory Visit (INDEPENDENT_AMBULATORY_CARE_PROVIDER_SITE_OTHER): Payer: Commercial Managed Care - HMO | Admitting: Licensed Clinical Social Worker

## 2021-12-20 ENCOUNTER — Other Ambulatory Visit: Payer: Self-pay

## 2021-12-20 DIAGNOSIS — F411 Generalized anxiety disorder: Secondary | ICD-10-CM

## 2021-12-20 DIAGNOSIS — F410 Panic disorder [episodic paroxysmal anxiety] without agoraphobia: Secondary | ICD-10-CM

## 2021-12-20 MED ORDER — HYDRALAZINE HCL 25 MG PO TABS: 25.0000 mg | ORAL_TABLET | Freq: Three times a day (TID) | ORAL | 3 refills | Status: DC

## 2021-12-21 ENCOUNTER — Other Ambulatory Visit (HOSPITAL_COMMUNITY): Payer: Self-pay | Admitting: Psychiatry

## 2021-12-21 DIAGNOSIS — F410 Panic disorder [episodic paroxysmal anxiety] without agoraphobia: Secondary | ICD-10-CM

## 2021-12-21 MED ORDER — MIRTAZAPINE 30 MG PO TABS: 30.0000 mg | ORAL_TABLET | Freq: Every evening | ORAL | 0 refills | Status: DC

## 2021-12-22 ENCOUNTER — Telehealth (HOSPITAL_COMMUNITY): Payer: Self-pay
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
## 2021-12-27 ENCOUNTER — Ambulatory Visit (HOSPITAL_COMMUNITY): Payer: Commercial Managed Care - HMO | Admitting: Licensed Clinical Social Worker

## 2022-01-03 ENCOUNTER — Ambulatory Visit (HOSPITAL_COMMUNITY): Payer: Commercial Managed Care - HMO | Admitting: Licensed Clinical Social Worker

## 2022-01-03 ENCOUNTER — Encounter (HOSPITAL_COMMUNITY): Payer: Self-pay

## 2022-01-07 ENCOUNTER — Other Ambulatory Visit (INDEPENDENT_AMBULATORY_CARE_PROVIDER_SITE_OTHER): Payer: Self-pay | Admitting: Primary Care

## 2022-01-07 DIAGNOSIS — I10 Essential (primary) hypertension: Secondary | ICD-10-CM

## 2022-01-07 NOTE — Telephone Encounter (Signed)
The patient called in checking to see that we received the request from her pharmacy to refill her medication. Please assist patient further

## 2022-01-08 ENCOUNTER — Telehealth: Payer: Self-pay | Admitting: Cardiology

## 2022-01-08 ENCOUNTER — Other Ambulatory Visit: Payer: Self-pay

## 2022-01-08 ENCOUNTER — Encounter (HOSPITAL_COMMUNITY): Payer: Self-pay

## 2022-01-08 ENCOUNTER — Ambulatory Visit (HOSPITAL_COMMUNITY): Payer: Commercial Managed Care - HMO | Admitting: Mental Health

## 2022-01-08 DIAGNOSIS — F411 Generalized anxiety disorder: Secondary | ICD-10-CM

## 2022-01-08 DIAGNOSIS — I10 Essential (primary) hypertension: Secondary | ICD-10-CM

## 2022-01-08 MED ORDER — LABETALOL HCL 200 MG PO TABS: 200.0000 mg | ORAL_TABLET | Freq: Every day | ORAL | 1 refills | Status: DC
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
## 2022-01-08 NOTE — Telephone Encounter (Signed)
Patient needs refill on labetalol. Please send to [facility] on [address]. Please notify when completed.

## 2022-01-08 NOTE — Telephone Encounter (Signed)
Ok send

## 2022-02-04 ENCOUNTER — Telehealth (HOSPITAL_COMMUNITY): Payer: Medicaid Other | Admitting: Psychiatry

## 2022-02-04 DIAGNOSIS — F410 Panic disorder [episodic paroxysmal anxiety] without agoraphobia: Secondary | ICD-10-CM

## 2022-02-04 DIAGNOSIS — F411 Generalized anxiety disorder: Secondary | ICD-10-CM

## 2022-02-04 MED ORDER — MIRTAZAPINE 30 MG PO TABS: 30.0000 mg | ORAL_TABLET | Freq: Every evening | ORAL | 2 refills | Status: DC
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
## 2022-02-05 ENCOUNTER — Encounter (HOSPITAL_COMMUNITY): Payer: Self-pay | Admitting: Psychiatry

## 2022-02-11 ENCOUNTER — Ambulatory Visit (HOSPITAL_COMMUNITY): Payer: Self-pay | Admitting: Mental Health

## 2022-02-11 DIAGNOSIS — F411 Generalized anxiety disorder: Secondary | ICD-10-CM

## 2022-02-11 NOTE — Progress Notes (Signed)
Pt connected to [facility] for tele-therapy session and reported to not feel well and would like to reschedule. Rescheduled for 2/12 @ 2pm

## 2022-02-16 ENCOUNTER — Other Ambulatory Visit (INDEPENDENT_AMBULATORY_CARE_PROVIDER_SITE_OTHER): Payer: Self-pay | Admitting: Primary Care

## 2022-02-16 DIAGNOSIS — D509 Iron deficiency anemia, unspecified: Secondary | ICD-10-CM

## 2022-02-18 NOTE — Telephone Encounter (Signed)
Requested Prescriptions  Pending Prescriptions Disp Refills   ferrous sulfate 325 (65 FE) MG EC tablet [Pharmacy Med Name: FERROUS SULF EC 325 MG TABLET] 90 tablet 0    Sig: TAKE 1 TABLET BY MOUTH EVERY DAY     Endocrinology:  Minerals - Iron Supplementation Failed - 02/16/2022  8:20 AM      Failed - Fe (serum) in normal range and within 360 days    No results found for: "IRON", "IRONPCTSAT"       Failed - Ferritin in normal range and within 360 days    No results found for: "FERRITIN"       Passed - HGB in normal range and within 360 days    Hemoglobin  Date Value Ref Range Status  12/11/2021 12.0 11.1 - 15.9 g/dL Final         Passed - HCT in normal range and within 360 days    Hematocrit  Date Value Ref Range Status  12/11/2021 36.7 34.0 - 46.6 % Final         Passed - RBC in normal range and within 360 days    RBC  Date Value Ref Range Status  12/11/2021 4.64 3.77 - 5.28 x10E6/uL Final  09/14/2021 4.45 3.87 - 5.11 MIL/uL Final         Passed - Valid encounter within last 12 months    Recent Outpatient Visits           2 months ago Encounter for HCV screening test for low risk patient   [place], Michelle P, NP   5 months ago Liver cyst   [facility] Kerin Perna, NP   10 months ago Essential hypertension   [place], [place] P, NP   1 year ago Screening for diabetes mellitus   [place] Kerin Perna, NP   1 year ago Encounter to establish care   [facility] Kerin Perna, NP       Future Appointments             In 3 months Rex Kras, DO [facility]

## 2022-02-27 ENCOUNTER — Other Ambulatory Visit: Payer: Self-pay | Admitting: Cardiology

## 2022-02-27 DIAGNOSIS — I10 Essential (primary) hypertension: Secondary | ICD-10-CM

## 2022-03-04 ENCOUNTER — Ambulatory Visit (INDEPENDENT_AMBULATORY_CARE_PROVIDER_SITE_OTHER): Payer: Medicaid Other | Admitting: Mental Health

## 2022-03-04 DIAGNOSIS — F411 Generalized anxiety disorder: Secondary | ICD-10-CM

## 2022-03-04 DIAGNOSIS — F410 Panic disorder [episodic paroxysmal anxiety] without agoraphobia: Secondary | ICD-10-CM
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
## 2022-03-17 ENCOUNTER — Encounter (INDEPENDENT_AMBULATORY_CARE_PROVIDER_SITE_OTHER): Payer: Self-pay | Admitting: Primary Care

## 2022-03-17 DIAGNOSIS — D509 Iron deficiency anemia, unspecified: Secondary | ICD-10-CM

## 2022-03-24 ENCOUNTER — Other Ambulatory Visit: Payer: Self-pay | Admitting: Cardiology

## 2022-03-25 ENCOUNTER — Other Ambulatory Visit: Payer: Self-pay

## 2022-03-25 DIAGNOSIS — I10 Essential (primary) hypertension: Secondary | ICD-10-CM

## 2022-03-25 MED ORDER — HYDRALAZINE HCL 25 MG PO TABS: 25.0000 mg | ORAL_TABLET | Freq: Three times a day (TID) | ORAL | 3 refills | Status: DC

## 2022-03-25 MED ORDER — ATORVASTATIN CALCIUM 40 MG PO TABS: 40.0000 mg | ORAL_TABLET | Freq: Every day | ORAL | 3 refills | Status: DC

## 2022-03-27 ENCOUNTER — Emergency Department (HOSPITAL_COMMUNITY)
Admission: EM | Admit: 2022-03-27 | Discharge: 2022-03-27 | Disposition: A | Discharge status: Home / Self Care | Payer: Medicaid Other | Attending: Emergency Medicine | Admitting: Emergency Medicine

## 2022-03-27 ENCOUNTER — Encounter (HOSPITAL_COMMUNITY): Payer: Self-pay | Admitting: Emergency Medicine

## 2022-03-27 ENCOUNTER — Emergency Department (HOSPITAL_COMMUNITY): Payer: Medicaid Other

## 2022-03-27 ENCOUNTER — Other Ambulatory Visit: Payer: Self-pay

## 2022-03-27 DIAGNOSIS — I10 Essential (primary) hypertension: Secondary | ICD-10-CM | POA: Insufficient documentation

## 2022-03-27 DIAGNOSIS — Z79899 Other long term (current) drug therapy: Secondary | ICD-10-CM | POA: Insufficient documentation

## 2022-03-27 DIAGNOSIS — R5383 Other fatigue: Secondary | ICD-10-CM | POA: Insufficient documentation

## 2022-03-27 LAB — CBC WITH DIFFERENTIAL/PLATELET
Abs Immature Granulocytes: 0.01 10*3/uL (ref 0.00–0.07)
Basophils Absolute: 0 10*3/uL (ref 0.0–0.1)
Basophils Relative: 0 %
Eosinophils Absolute: 0.1 10*3/uL (ref 0.0–0.5)
Eosinophils Relative: 2 %
HCT: 36.7 % (ref 36.0–46.0)
Hemoglobin: 11.7 g/dL — ABNORMAL LOW (ref 12.0–15.0)
Immature Granulocytes: 0 %
Lymphocytes Relative: 29 %
Lymphs Abs: 1.8 10*3/uL (ref 0.7–4.0)
MCH: 25.8 pg — ABNORMAL LOW (ref 26.0–34.0)
MCHC: 31.9 g/dL (ref 30.0–36.0)
MCV: 81 fL (ref 80.0–100.0)
Monocytes Absolute: 0.6 10*3/uL (ref 0.1–1.0)
Monocytes Relative: 9 %
Neutro Abs: 3.7 10*3/uL (ref 1.7–7.7)
Neutrophils Relative %: 60 %
Platelets: 328 10*3/uL (ref 150–400)
RBC: 4.53 MIL/uL (ref 3.87–5.11)
RDW: 15 % (ref 11.5–15.5)
WBC: 6.2 10*3/uL (ref 4.0–10.5)
nRBC: 0 % (ref 0.0–0.2)

## 2022-03-27 LAB — TROPONIN I (HIGH SENSITIVITY): Troponin I (High Sensitivity): <2 ng/L (ref <18)

## 2022-03-27 LAB — COMPREHENSIVE METABOLIC PANEL
ALT: 18 U/L (ref 0–44)
AST: 16 U/L (ref 15–41)
Albumin: 4.3 g/dL (ref 3.5–5.0)
Alkaline Phosphatase: 77 U/L (ref 38–126)
Anion gap: 8 (ref 5–15)
BUN: 9 mg/dL (ref 6–20)
CO2: 25 mmol/L (ref 22–32)
Calcium: 9 mg/dL (ref 8.9–10.3)
Chloride: 104 mmol/L (ref 98–111)
Creatinine, Ser: 0.6 mg/dL (ref 0.44–1.00)
GFR, Estimated: >60 mL/min (ref >60)
Glucose, Bld: 108 mg/dL — ABNORMAL HIGH (ref 70–99)
Potassium: 3.6 mmol/L (ref 3.5–5.1)
Sodium: 137 mmol/L (ref 135–145)
Total Bilirubin: 0.5 mg/dL (ref 0.3–1.2)
Total Protein: 7.8 g/dL (ref 6.5–8.1)

## 2022-03-27 LAB — URINALYSIS, ROUTINE W REFLEX MICROSCOPIC
Bilirubin Urine: NEGATIVE
Glucose, UA: NEGATIVE mg/dL
Hgb urine dipstick: NEGATIVE
Ketones, ur: NEGATIVE mg/dL
Leukocytes,Ua: NEGATIVE
Nitrite: NEGATIVE
Protein, ur: NEGATIVE mg/dL
Specific Gravity, Urine: 1.021 (ref 1.005–1.030)
pH: 5 (ref 5.0–8.0)

## 2022-03-27 LAB — I-STAT BETA HCG BLOOD, ED (MC, WL, AP ONLY): I-stat hCG, quantitative: <5 m[IU]/mL (ref <5)

## 2022-03-27 MED ORDER — ALUM & MAG HYDROXIDE-SIMETH 200-200-20 MG/5ML PO SUSP
30.0000 mL | Freq: Once | ORAL | Status: AC
  Administered 2022-03-27: 30 mL via ORAL
  Filled 2022-03-27: qty 30

## 2022-03-27 MED ORDER — LACTATED RINGERS IV BOLUS
1000.0000 mL | Freq: Once | INTRAVENOUS | Status: AC
  Administered 2022-03-27: 1000 mL via INTRAVENOUS

## 2022-03-27 MED ORDER — FAMOTIDINE 20 MG PO TABS
20.0000 mg | ORAL_TABLET | Freq: Once | ORAL | Status: AC
  Administered 2022-03-27: 20 mg via ORAL
  Filled 2022-03-27: qty 1

## 2022-03-27 MED ORDER — ACETAMINOPHEN 325 MG PO TABS
650.0000 mg | ORAL_TABLET | Freq: Once | ORAL | Status: AC
  Administered 2022-03-27: 650 mg via ORAL
  Filled 2022-03-27: qty 2

## 2022-03-27 NOTE — Discharge Instructions (Signed)
You were seen in the emergency department for your fatigue and your palpitations.  Your workup showed no signs of worsening anemia, dehydration and you had normal electrolytes and blood sugar.  It is unclear what is causing your symptoms at this time but you should follow-up with your primary doctor to have your symptoms rechecked.  You should return to the emergency department if you are having prolonged palpitations, you completely pass out, you have significantly worsening pain or if you have any other new or concerning symptoms.

## 2022-03-27 NOTE — ED Notes (Signed)
Sent urine with culture

## 2022-03-27 NOTE — ED Provider Notes (Signed)
[facility] Provider Note   CSN: [id number] Arrival date & time: 03/27/22  1756     History  Chief Complaint  Patient presents with   Dizziness    Erin Pollard is a 52 y.o. female.  Patient is a 52 year old female with a past medical history of hypertension and hyperlipidemia, anxiety presenting to the emergency department with dizziness and generalized fatigue.  Patient states over the last 2 to 3 days she has been feeling more fatigued.  She states that while reading a book 2 nights ago she had sudden onset of palpitations and lightheadedness but states that it resolved on its own after a few minutes.  She states that she is continue to feel fatigued since then.  She denies any further dizzy episodes, chest pain or shortness of breath.  She states that she has had intermittent headaches with nausea.  She denies any vomiting, diarrhea or constipation and states that she has had a normal appetite.  She denies any dysuria or hematuria.  The history is provided by the patient.  Dizziness      Home Medications Prior to Admission medications   Medication Sig Start Date End Date Taking? Authorizing Provider  atorvastatin (LIPITOR) 40 MG tablet Take 1 tablet (40 mg total) by mouth at bedtime. 03/25/22   Tolia, Sunit, DO  cholecalciferol (VITAMIN D3) 25 MCG (1000 UNIT) tablet Take 1,000 Units by mouth daily.    [provider]  clonazePAM (KLONOPIN) 0.5 MG tablet Take 1 tablet (0.5 mg total) by mouth 3 (three) times daily. 12/05/21 03/05/22  Bahraini, Sarah A  famotidine (PEPCID) 20 MG tablet Take 20 mg by mouth as needed.    [provider]  ferrous sulfate 325 (65 FE) MG EC tablet TAKE 1 TABLET BY MOUTH EVERY DAY 02/18/22   Kerin Perna, NP  hydrALAZINE (APRESOLINE) 25 MG tablet Take 1 tablet (25 mg total) by mouth 3 (three) times daily. 03/25/22   Tolia, Sunit, DO  hydrochlorothiazide (MICROZIDE) 12.5 MG capsule Take 1  capsule (12.5 mg total) by mouth daily. 09/11/21   Tolia, Sunit, DO  KLOR-CON M10 10 MEQ tablet Take 10 mEq by mouth daily. 08/31/21   [provider]  labetalol (NORMODYNE) 200 MG tablet TAKE 1 TABLET BY MOUTH EVERY DAY 01/08/22   Tolia, Sunit, DO  loratadine (CLARITIN) 10 MG tablet Take 1 tablet (10 mg total) by mouth daily. 11/02/21   Kerin Perna, NP  losartan (COZAAR) 50 MG tablet TAKE 1 TABLET BY MOUTH EVERY DAY 02/27/22   Tolia, Sunit, DO  methocarbamol (ROBAXIN) 500 MG tablet Take 1 tablet (500 mg total) by mouth 2 (two) times daily. 07/29/21   Talbot Grumbling, FNP  mirtazapine (REMERON) 30 MG tablet Take 1 tablet (30 mg total) by mouth at bedtime. 02/04/22   Armando Reichert, MD  naproxen (NAPROSYN) 500 MG tablet Take 1 tablet (500 mg total) by mouth 2 (two) times daily. 09/11/21   Tolia, Sunit, DO  Omega-3 Fatty Acids (FISH OIL) 1000 MG CAPS Take 1 capsule by mouth daily.    [provider]  ondansetron (ZOFRAN-ODT) 4 MG disintegrating tablet Take 1 tablet (4 mg total) by mouth every 8 (eight) hours as needed for nausea or vomiting. 07/29/21   Talbot Grumbling, FNP  oxymetazoline (AFRIN NASAL SPRAY) 0.05 % nasal spray Place 1 spray into both nostrils 2 (two) times daily. 11/02/21   Kerin Perna, NP  potassium chloride (KLOR-CON 10)  10 MEQ tablet Take 1 tablet (10 mEq total) by mouth daily. 09/11/21   Tolia, Sunit, DO  sucralfate (CARAFATE) 1 g tablet Take 1 tablet (1 g total) by mouth 4 (four) times daily -  with meals and at bedtime. 04/20/21   Montine Circle, PA-C      Allergies    Amlodipine, Hydrocodone-acetaminophen, Iodinated contrast media, Iodine, Codeine, Hydrocortisone-iodoquinol, and Propoxyphene    Review of Systems   Review of Systems  Neurological:  Positive for dizziness.    Physical Exam Updated Vital Signs BP 126/69   Pulse 72   Temp 98.8 F (37.1 C) (Oral)   Resp 16   Ht 5' (1.524 m)   Wt 77 kg   SpO2 97%   BMI 33.15 kg/m   Physical Exam Vitals and nursing note reviewed.  Constitutional:      General: She is not in acute distress.    Appearance: Normal appearance.  HENT:     Head: Normocephalic and atraumatic.     Nose: Nose normal.     Mouth/Throat:     Mouth: Mucous membranes are moist.     Pharynx: Oropharynx is clear.  Eyes:     Extraocular Movements: Extraocular movements intact.     Conjunctiva/sclera: Conjunctivae normal.     Pupils: Pupils are equal, round, and reactive to light.  Cardiovascular:     Rate and Rhythm: Normal rate and regular rhythm.     Pulses: Normal pulses.     Heart sounds: Normal heart sounds.  Pulmonary:     Effort: Pulmonary effort is normal.     Breath sounds: Normal breath sounds.  Abdominal:     General: Abdomen is flat.     Palpations: Abdomen is soft.     Tenderness: There is no abdominal tenderness.  Musculoskeletal:        General: Normal range of motion.     Cervical back: Normal range of motion and neck supple.  Skin:    General: Skin is warm and dry.  Neurological:     General: No focal deficit present.     Mental Status: She is alert and oriented to person, place, and time.     Sensory: No sensory deficit.     Motor: No weakness.  Psychiatric:        Mood and Affect: Mood normal.        Behavior: Behavior normal.     ED Results / Procedures / Treatments   Labs (all labs ordered are listed, but only abnormal results are displayed) Labs Reviewed  CBC WITH DIFFERENTIAL/PLATELET - Abnormal; Notable for the following components:      Result Value   Hemoglobin 11.7 (*)    MCH 25.8 (*)    All other components within normal limits  COMPREHENSIVE METABOLIC PANEL - Abnormal; Notable for the following components:   Glucose, Bld 108 (*)    All other components within normal limits  URINALYSIS, ROUTINE W REFLEX MICROSCOPIC  I-STAT BETA HCG BLOOD, ED ([facility], [facility], [facility] ONLY)  TROPONIN I (HIGH SENSITIVITY)    EKG EKG Interpretation  Date/Time:  Wednesday  March 27 2022 18:12:14 EST Ventricular Rate:  75 PR Interval:  151 QRS Duration: 80 QT Interval:  379 QTC Calculation: 424 R Axis:   3 Text Interpretation: Sinus rhythm Paired ventricular premature complexes Consider left atrial enlargement No significant change since last tracing Confirmed by Leanord Asal ([id number]) on 03/27/2022 6:58:23 PM  Radiology DG Chest 2 View  Result Date: 03/27/2022 CLINICAL  DATA:  Dizziness. EXAM: CHEST - 2 VIEW COMPARISON:  Jun 09, 2021. FINDINGS: The heart size and mediastinal contours are within normal limits. Both lungs are clear. The visualized skeletal structures are unremarkable. IMPRESSION: No active cardiopulmonary disease. Electronically Signed   By: Marijo Conception M.D.   On: 03/27/2022 18:36    Procedures Procedures    Medications Ordered in ED Medications  lactated ringers bolus 1,000 mL (1,000 mLs Intravenous New Bag/Given 03/27/22 1854)  acetaminophen (TYLENOL) tablet 650 mg (650 mg Oral Given 03/27/22 1853)  famotidine (PEPCID) tablet 20 mg (20 mg Oral Given 03/27/22 2026)  alum & mag hydroxide-simeth (MAALOX/MYLANTA) 200-200-20 MG/5ML suspension 30 mL (30 mLs Oral Given 03/27/22 2027)    ED Course/ Medical Decision Making/ A&P Clinical Course as of 03/27/22 2042  Wed Mar 27, 2022  2025 Labs within normal range, CXR and EKG without acute disease. Patient requested GI cocktail. She is stable for discharge home with primary care follow up. [VK]    Clinical Course User Index [VK] Kemper Durie, DO                             Medical Decision Making This patient presents to the ED with chief complaint(s) of dizziness, fatigue with pertinent past medical history of HTN, HLD, anxiety which further complicates the presenting complaint. The complaint involves an extensive differential diagnosis and also carries with it a high risk of complications and morbidity.    The differential diagnosis includes anemia, arrhythmia, atypical ACS, electrolyte  abnormality, hypo or hyperglycemia, infection, viral syndrome  Additional history obtained: Additional history obtained from N/A Records reviewed Primary Care Documents  ED Course and Reassessment: On patient's arrival to the emergency department she is awake alert well-appearing in no acute distress.  EKG performed on arrival showed normal sinus rhythm without ischemic changes.  She was initially evaluated by provider in triage and had labs including troponin and urine performed.  She will be given IV fluids and Tylenol for her headache and will be closely reassessed.  Independent labs interpretation:  The following labs were independently interpreted: Within normal range  Independent visualization of imaging: - I independently visualized the following imaging with scope of interpretation limited to determining acute life threatening conditions related to emergency care: CXR, which revealed no acute disease  Consultation: - Consulted or discussed management/test interpretation w/ external professional: N/A  Consideration for admission or further workup: Patient has no emergent conditions requiring admission or further work-up at this time and is stable for discharge home with primary care follow-up  Social Determinants of health: N/A    Amount and/or Complexity of Data Reviewed Labs: ordered.  Risk OTC drugs.          Final Clinical Impression(s) / ED Diagnoses Final diagnoses:  Fatigue, unspecified type    Rx / DC Orders ED Discharge Orders     None         Kemper Durie, DO 03/27/22 2042

## 2022-03-27 NOTE — ED Triage Notes (Signed)
C/o sitting on bed yesterday when started having lightheaded, dizziness, and felt like she was going to pass out for few seconds and then felt normal self after that Denies loc  C/o headache and nausea today.  Hx anxiety.

## 2022-04-03 ENCOUNTER — Telehealth (HOSPITAL_COMMUNITY): Payer: Self-pay | Admitting: Psychiatry

## 2022-04-03 ENCOUNTER — Other Ambulatory Visit (HOSPITAL_COMMUNITY): Payer: Self-pay | Admitting: Psychiatry

## 2022-04-03 ENCOUNTER — Other Ambulatory Visit (HOSPITAL_COMMUNITY): Payer: Self-pay | Admitting: Physician Assistant

## 2022-04-03 DIAGNOSIS — F410 Panic disorder [episodic paroxysmal anxiety] without agoraphobia: Secondary | ICD-10-CM

## 2022-04-03 DIAGNOSIS — F411 Generalized anxiety disorder: Secondary | ICD-10-CM

## 2022-04-03 MED ORDER — CLONAZEPAM 0.5 MG PO TABS: 0.5000 mg | ORAL_TABLET | Freq: Three times a day (TID) | ORAL | 0 refills | Status: DC

## 2022-04-03 NOTE — Telephone Encounter (Signed)
Erin Pollard, this patient belongs to Rosita Kea, MD. I've attached her to the message.

## 2022-04-03 NOTE — Telephone Encounter (Signed)
Patient called asking for refill of Klonopin. Message sent to MD for review. Next appointment 04/22/22.

## 2022-04-03 NOTE — Progress Notes (Signed)
Provider was contacted by Claudius Sis, RN regarding patient's clonazepam refill request.  Patient was last seen by Armando Reichert, RN on 02/04/2022.  Patient is currently out of her prescription for clonazepam and does not see Dr. Sande Rives until 04/22/2022.  Provider to provide a 30-day supply of patient's clonazepam which should last her until her next follow-up appointment with Dr. Sande Rives.  Patient's medication to be e-prescribed to pharmacy of choice.

## 2022-04-04 ENCOUNTER — Ambulatory Visit (INDEPENDENT_AMBULATORY_CARE_PROVIDER_SITE_OTHER): Payer: Medicaid Other | Admitting: Mental Health

## 2022-04-04 DIAGNOSIS — F411 Generalized anxiety disorder: Secondary | ICD-10-CM | POA: Diagnosis not present

## 2022-04-04 DIAGNOSIS — F410 Panic disorder [episodic paroxysmal anxiety] without agoraphobia: Secondary | ICD-10-CM | POA: Diagnosis not present

## 2022-04-04 NOTE — Progress Notes (Signed)
THERAPIST PROGRESS NOTE Virtual Visit via Video Note  I connected with Erin Pollard on 04/04/22 at  4:30 PM EDT by a video enabled telemedicine application and verified that I am speaking with the correct person using two identifiers.  Location: Patient: home address on file Provider: office    I discussed the limitations of evaluation and management by telemedicine and the availability of in person appointments. The patient expressed understanding and agreed to proceed.  I discussed the assessment and treatment plan with the patient. The patient was provided an opportunity to ask questions and all were answered. The patient agreed with the plan and demonstrated an understanding of the instructions.   The patient was advised to call back or seek an in-person evaluation if the symptoms worsen or if the condition fails to improve as anticipated.  I provided 55 minutes of non-face-to-face time during this encounter.   Marion Downer, [facility]   Session Time: 4:35 pm ( 55 minutes)  Participation Level: Active  Behavioral Response: CasualAlertAnxious  Type of Therapy: Individual Therapy  Treatment Goals addressed: Ia will increase management of anxiety AEB ability to engage in use of x 3 effective coping skills for anxiety with ability to reframe maladaptive thinking patterns daily as needed within the next 90 days.     ProgressTowards Goals: Progressing  Interventions: CBT and Supportive  Summary:  Erin Pollard is a 52 y.o. female who presents with dx of generalized anxiety disorder with panic.  Lynnzie presents to session alert and oriented; mood and affect adequate. Speech clear and coherent at pressured rate and normal tone. Engaged and cooperative; receptive. Hagar shares to have presented to ED previous week for panic attacks. Shares prior to have been sharing with daughter concerning her childhood. Notes to have started to read her bible, take deep breathes for  use of coping and although emotional reaction did lessen to still not feel as if she was doing well so presented to ED. Notes to have felt dizzy with chest palpitations. Shares prior to have also been worried about son, who continues to use substances and denies desire to enter treatment. Notes difficulty having time to self with taking care of grand-daughter while daughter works and shares when daughter is home grand daughter continues to be under her. Able to explore with therapist working to increase ability to engae in self-care for coping with feelings of anxiety and being overwhelmed and working to set better boundaries and communicating needs to daughter. Progress with goals with use of deep breathing for use of coping and reading bible. Sxs stable at this time with panic attacks continuing in episodes. Denies SI/HI  Suicidal/Homicidal: Nowithout intent/plan  Therapist Response: Therapist engaged [name] in tele-therapy session. Assessed for current location and ability to hold confidential session. Assessed for current level of functioning sxs management and sxs and level of stressors. Provided Arti safe place to shares thoughts and feelings related to recent anxiety attack and abity to work to Film/video editor. Explored with Dotti triggers for increased anxiety and exploring working to focus on things which are within her control and ability to support and care without overly engaging and adding on additional stressors. Reviewed breath work and signs of increasing anxiety and working to de-escalate prior to anxiety/panic attacks occurring. Encouraged increased self-care and working to take time for self and communicating to daughter of need of break of watching grand-daughter. Reviewed session, provided follow up. No safety concerns noted.   Plan: Return again in  x 4 weeks.  Diagnosis: Generalized anxiety disorder with panic attacks  Collaboration of Care: Other None  Patient/Guardian was  advised Release of Information must be obtained prior to any record release in order to collaborate their care with an outside provider. Patient/Guardian was advised if they have not already done so to contact the registration department to sign all necessary forms in order for [place] to release information regarding their care.   Consent: Patient/Guardian gives verbal consent for treatment and assignment of benefits for services provided during this visit. Patient/Guardian expressed understanding and agreed to proceed.   Rockey Situ [place], [facility] 04/04/2022

## 2022-04-08 ENCOUNTER — Ambulatory Visit: Payer: Medicaid Other | Admitting: Cardiology

## 2022-04-08 NOTE — Telephone Encounter (Unsigned)
Copied from [place] [id number]. Topic: Appointment Scheduling - Scheduling Inquiry for Clinic >> Apr 08, 2022  2:34 PM Everette C wrote: Reason for CRM: The patient was seen in [facility] ED on 03/27/22  The patient would like to speak with a member of clinical staff to further discuss their after care and scheduling additional labwork   Please contact the patient further when possible

## 2022-04-12 ENCOUNTER — Ambulatory Visit: Payer: Medicaid Other | Admitting: Cardiology

## 2022-04-12 ENCOUNTER — Encounter: Payer: Self-pay | Admitting: Cardiology

## 2022-04-12 VITALS — BP 150/87 | HR 76 | Resp 17 | Ht 60.0 in | Wt 166.2 lb

## 2022-04-12 DIAGNOSIS — H0263 Xanthelasma of right eye, unspecified eyelid: Secondary | ICD-10-CM

## 2022-04-12 DIAGNOSIS — R0609 Other forms of dyspnea: Secondary | ICD-10-CM

## 2022-04-12 DIAGNOSIS — Z87891 Personal history of nicotine dependence: Secondary | ICD-10-CM

## 2022-04-12 DIAGNOSIS — Z8249 Family history of ischemic heart disease and other diseases of the circulatory system: Secondary | ICD-10-CM

## 2022-04-12 DIAGNOSIS — E7800 Pure hypercholesterolemia, unspecified: Secondary | ICD-10-CM

## 2022-04-12 DIAGNOSIS — I10 Essential (primary) hypertension: Secondary | ICD-10-CM

## 2022-04-12 MED ORDER — NEXLIZET 180-10 MG PO TABS: 1.0000 | ORAL_TABLET | Freq: Every day | ORAL | 0 refills | Status: DC

## 2022-04-12 NOTE — Progress Notes (Signed)
Date:  04/12/2022   ID:  Erin Pollard, DOB Jun 11, 1970, MRN [id number]  PCP:  Kerin Perna, NP  Cardiologist:  Rex Kras, DO, [facility] (established care February 01, 2021) Former Cardiology Providers: Dr. Delfina Redwood [facility], [place])  Date: 04/12/22 Last Office Visit: 12/11/2021  Chief Complaint  Patient presents with   Hospitalization Follow-up   Fatigue   Dizziness    HPI  Erin Pollard is a 52 y.o. African-American female whose past medical history and cardiovascular risk factors include: Panic attack, anxiety, hyperlipidemia, history of myocardial infarction (04/2015) no intervention, family history of premature CAD, benign essential hypertension, GERD, former smoker, obesity due to excess calories.   Referred to the practice back in January 2023 for chest pain and shortness of breath evaluation.  She underwent ischemic workup as outlined below.  In addition, on initial physical examination she was noted to have xanthelasmas and corresponding lipids at that time noted total cholesterol to be 223 and LDL to be 158 mg/dL.  She was started on atorvastatin which she tolerated well but for reasons unknown she stopped taking it.  And due to financial constraints she did not have repeat labs.  For an interim she was transition to Crestor as per her preference but reverted back to Lipitor.  She presents today for ER visit that she recently had for dizziness.She denies any anginal chest pain or heart failure symptoms.  Still feels tired and fatigued.  Did not have a good night sleep yesterday which may be attributing to elevated blood pressures.  However at home SBP's are generally less than 130 mmHg.  She denies near-syncope or syncopal events.  She still has an appointment with PCP in the coming weeks given her recent ED visit.  Family history of premature CAD with mother having a myocardial infarction at the age of 63 and passed.  FUNCTIONAL STATUS: No structured exercise  program or daily routine.   ALLERGIES: Allergies  Allergen Reactions   Amlodipine Hives    Achy legs and s.o.b Achy legs and s.o.b    Hydrocodone-Acetaminophen Hives and Rash   Iodinated Contrast Media Hives   Iodine Hives and Rash   Codeine    Hydrocortisone-Iodoquinol    Propoxyphene     MEDICATION LIST PRIOR TO VISIT: Current Meds  Medication Sig   atorvastatin (LIPITOR) 40 MG tablet Take 1 tablet (40 mg total) by mouth at bedtime.   Bempedoic Acid-Ezetimibe (NEXLIZET) 180-10 MG TABS Take 1 tablet by mouth daily.   cholecalciferol (VITAMIN D3) 25 MCG (1000 UNIT) tablet Take 1,000 Units by mouth daily.   clonazePAM (KLONOPIN) 0.5 MG tablet Take 1 tablet (0.5 mg total) by mouth 3 (three) times daily.   famotidine (PEPCID) 20 MG tablet Take 20 mg by mouth as needed.   ferrous sulfate 325 (65 FE) MG EC tablet TAKE 1 TABLET BY MOUTH EVERY DAY   hydrALAZINE (APRESOLINE) 25 MG tablet Take 1 tablet (25 mg total) by mouth 3 (three) times daily.   hydrochlorothiazide (MICROZIDE) 12.5 MG capsule Take 1 capsule (12.5 mg total) by mouth daily.   KLOR-CON M10 10 MEQ tablet Take 10 mEq by mouth daily.   labetalol (NORMODYNE) 200 MG tablet TAKE 1 TABLET BY MOUTH EVERY DAY   loratadine (CLARITIN) 10 MG tablet Take 1 tablet (10 mg total) by mouth daily.   losartan (COZAAR) 50 MG tablet TAKE 1 TABLET BY MOUTH EVERY DAY   methocarbamol (ROBAXIN) 500 MG tablet Take 1 tablet (500 mg total)  by mouth 2 (two) times daily.   mirtazapine (REMERON) 30 MG tablet Take 1 tablet (30 mg total) by mouth at bedtime.   naproxen (NAPROSYN) 500 MG tablet Take 1 tablet (500 mg total) by mouth 2 (two) times daily.   Omega-3 Fatty Acids (FISH OIL) 1000 MG CAPS Take 1 capsule by mouth daily.   ondansetron (ZOFRAN-ODT) 4 MG disintegrating tablet Take 1 tablet (4 mg total) by mouth every 8 (eight) hours as needed for nausea or vomiting.   oxymetazoline (AFRIN NASAL SPRAY) 0.05 % nasal spray Place 1 spray into both  nostrils 2 (two) times daily.   sucralfate (CARAFATE) 1 g tablet Take 1 tablet (1 g total) by mouth 4 (four) times daily -  with meals and at bedtime.     PAST MEDICAL HISTORY: Past Medical History:  Diagnosis Date   Anxiety    Cervical radiculopathy    GERD (gastroesophageal reflux disease)    Hypertension    Myocardial infarction ([place])    Panic attack     PAST SURGICAL HISTORY: Past Surgical History:  Procedure Laterality Date   LEFT HEART CATH     TUBAL LIGATION      FAMILY HISTORY: The patient family history includes Cancer in her father; Heart attack in her maternal grandmother and mother; Heart disease in her maternal grandfather and maternal grandmother; Stroke in her father, maternal grandfather, and sister.  SOCIAL HISTORY:  The patient  reports that she quit smoking about 7 years ago. Her smoking use included cigarettes. She does not have any smokeless tobacco history on file. She reports that she does not currently use alcohol. She reports that she does not use drugs.  REVIEW OF SYSTEMS: Review of Systems  Constitutional: Positive for malaise/fatigue.  Cardiovascular:  Negative for chest pain, dyspnea on exertion, leg swelling, orthopnea, palpitations, paroxysmal nocturnal dyspnea and syncope.  Respiratory:  Negative for shortness of breath.     PHYSICAL EXAM:    04/12/2022    9:07 AM 03/27/2022    8:30 PM 03/27/2022    8:00 PM  Vitals with BMI  Height 5\' 0"     Weight 166 lbs 3 oz    BMI [id number]    Systolic [id number]  Diastolic 87 89 69  Pulse 76 70 72   Physical Exam Cardiovascular:     Rate and Rhythm: Normal rate and regular rhythm.     Pulses: Normal pulses.          Carotid pulses are 2+ on the right side and 2+ on the left side.      Radial pulses are 2+ on the right side and 2+ on the left side.       Dorsalis pedis pulses are 2+ on the right side and 2+ on the left side.     Heart sounds: Normal heart sounds. No murmur heard.    No gallop.   Pulmonary:     Effort: Pulmonary effort is normal. No respiratory distress.     Breath sounds: Normal breath sounds. No wheezing.  Musculoskeletal:     Right lower leg: No edema.     Left lower leg: No edema.    RADIOLOGY DATABASE: Chest x-ray 11/13/2020: No active cardiopulmonary disease.  CARDIAC DATABASE: EKG  04/12/2022: Sinus rhythm, 66 bpm, LAE, without underlying ischemia or injury pattern.  No significant change compared to 12/11/2021  Echocardiogram: 04/2016 [place] system available in Care Everywhere: LVEF 60 to [id number], normal diastolic filling pattern, normal right ventricular  size and function, normal left atrial size, no significant valvular heart disease.  02/23/2021: Normal LV systolic function with visual EF 60-65%. Left ventricle cavity is normal in size. Normal left ventricular wall thickness. Normal global wall motion. Normal diastolic filling pattern, normal LAP. No significant valvular heart disease. Anechoic structure within the liver parenchyma measuring 3.34 x 4.05cm likely a cyst, consider dedicated ultrasound of abdomen if clinically indicated.  Compared to outside study 04/2016 no significant change.   Stress Testing: Exercise treadmill stress test 02/23/2021: Functional status: Fair. Chest pain: No. Reason for stopping exercise: Dyspnea, patient request. Hypertensive response to exercise: No. Exercise time 5 minutes 59 seconds on Bruce protocol, achieved 7.05 METS, 88% of age-predicted maximum heart rate (APMHR).  Stress ECG positive for ischemia.  Intermediate risk study. Consider further cardiac work up if clinically indicated. Clinical correlation required.   Exercise nuclear stress test 04/02/2021: Normal ECG stress. The patient exercised for 5 minutes and 0 seconds of a Bruce protocol, achieving approximately 7.05 METs. Exercise capacity reduced. The blood pressure response was normal. Myocardial perfusion is normal. Overall LV  systolic function is normal without regional wall motion abnormalities. Stress LV EF: 69%.  No previous exam available for comparison. Low risk.  Heart Catheterization: Last left heart catheterization April 2017 per patient.  We will request records.  LABORATORY DATA:    Latest Ref Rng & Units 03/27/2022    6:10 PM 12/11/2021    3:49 PM 09/14/2021    1:28 AM  CBC  WBC 4.0 - 10.5 K/uL 6.2  5.8  5.2   Hemoglobin 12.0 - 15.0 g/dL 11.7  12.0  11.7   Hematocrit 36.0 - 46.0 % 36.7  36.7  36.0   Platelets 150 - 400 K/uL 328  310  316        Latest Ref Rng & Units 03/27/2022    6:10 PM 12/11/2021    3:49 PM 09/19/2021    4:35 PM  CMP  Glucose 70 - 99 mg/dL 108  82  81   BUN 6 - 20 mg/dL 9  9  8    Creatinine 0.44 - 1.00 mg/dL 0.60  0.68  0.69   Sodium 135 - 145 mmol/L 137  141  140   Potassium 3.5 - 5.1 mmol/L 3.6  3.9  3.8   Chloride 98 - 111 mmol/L 104  102  100   CO2 22 - 32 mmol/L 25  25  24    Calcium 8.9 - 10.3 mg/dL 9.0  9.6  9.5   Total Protein 6.5 - 8.1 g/dL 7.8  7.2  7.2   Total Bilirubin 0.3 - 1.2 mg/dL 0.5  0.4  0.3   Alkaline Phos 38 - 126 U/L 77  102  101   AST 15 - 41 U/L 16  18  12    ALT 0 - 44 U/L 18  23  13      Lipid Panel  Lab Results  Component Value Date   CHOL 173 11/14/2021   HDL 48 11/14/2021   LDLCALC 112 (H) 11/14/2021   LDLDIRECT 109 (H) 11/14/2021   TRIG 65 11/14/2021   CHOLHDL 5.0 09/14/2021    BMP Recent Labs    06/10/21 0115 09/14/21 0333 09/19/21 1635 12/11/21 1549 03/27/22 1810  NA 140 140 140 141 137  K 3.3* 3.4* 3.8 3.9 3.6  CL 104 107 100 102 104  CO2 29 26 24 25 25   GLUCOSE 89 100* 81 82 108*  BUN 9 9 8  9  9  CREATININE 0.60 0.65 0.69 0.68 0.60  CALCIUM 9.4 8.9 9.5 9.6 9.0  GFRNONAA >60 >60  --   --  >60    HEMOGLOBIN A1C Lab Results  Component Value Date   HGBA1C 5.6 12/11/2021    IMPRESSION:    ICD-10-CM   1. Dyspnea on exertion  R06.09 PCV ECHOCARDIOGRAM COMPLETE    2. Pure hypercholesterolemia  E78.00 EKG 12-Lead     Bempedoic Acid-Ezetimibe (NEXLIZET) 180-10 MG TABS    Lipid Panel With LDL/HDL Ratio    LDL cholesterol, direct    CMP14+EGFR    3. Xanthelasma of eyelid, bilateral  H02.63 Bempedoic Acid-Ezetimibe (NEXLIZET) 180-10 MG TABS   H02.66 Lipid Panel With LDL/HDL Ratio    LDL cholesterol, direct    CMP14+EGFR    4. Benign hypertension  I10     5. Former smoker  Z87.891     3. Family history of premature CAD  Z82.49        RECOMMENDATIONS: LEIGHAN PINZONE is a 52 y.o. African-American female whose past medical history and cardiac risk factors include: Panic attack, anxiety, history of myocardial infarction (04/2015) no intervention, family history of premature CAD, benign essential hypertension, GERD, former smoker, obesity due to excess calories.   Dyspnea on exertion Likely noncardiac. Prior cardiac workup reviewed. EKG today is nonischemic. Echo will be ordered to evaluate for structural heart disease and left ventricular systolic function.  Pure hypercholesterolemia Xanthelasma of eyelid, bilateral Initial LDL 158 mg/dL with physical examination findings of xanthelasmas bilaterally and family history of premature CAD.  Most recent LDL has improved to 109 mg/dL as of October 2023. Continues to be on statin therapy. Recommend initiation of Nexlizet to further optimize her LDL levels. Labs in 6 weeks to reevaluate therapy.  Benign hypertension Office blood pressures are not well-controlled. Likely secondary to poor sleep yesterday night.  Otherwise home blood pressures are consistently less than 130 mmHg. Medications reconciled. No changes warranted for now. She is recommended to continue monitoring it at home.  FINAL MEDICATION LIST END OF ENCOUNTER: Meds ordered this encounter  Medications   Bempedoic Acid-Ezetimibe (NEXLIZET) 180-10 MG TABS    Sig: Take 1 tablet by mouth daily.    Dispense:  90 tablet    Refill:  0    Medications Discontinued During This Encounter   Medication Reason   potassium chloride (KLOR-CON 10) 10 MEQ tablet       Current Outpatient Medications:    atorvastatin (LIPITOR) 40 MG tablet, Take 1 tablet (40 mg total) by mouth at bedtime., Disp: 90 tablet, Rfl: 3   Bempedoic Acid-Ezetimibe (NEXLIZET) 180-10 MG TABS, Take 1 tablet by mouth daily., Disp: 90 tablet, Rfl: 0   cholecalciferol (VITAMIN D3) 25 MCG (1000 UNIT) tablet, Take 1,000 Units by mouth daily., Disp: , Rfl:    clonazePAM (KLONOPIN) 0.5 MG tablet, Take 1 tablet (0.5 mg total) by mouth 3 (three) times daily., Disp: 90 tablet, Rfl: 0   famotidine (PEPCID) 20 MG tablet, Take 20 mg by mouth as needed., Disp: , Rfl:    ferrous sulfate 325 (65 FE) MG EC tablet, TAKE 1 TABLET BY MOUTH EVERY DAY, Disp: 90 tablet, Rfl: 0   hydrALAZINE (APRESOLINE) 25 MG tablet, Take 1 tablet (25 mg total) by mouth 3 (three) times daily., Disp: 270 tablet, Rfl: 3   hydrochlorothiazide (MICROZIDE) 12.5 MG capsule, Take 1 capsule (12.5 mg total) by mouth daily., Disp: 90 capsule, Rfl: 1   KLOR-CON M10 10 MEQ tablet, Take  10 mEq by mouth daily., Disp: , Rfl:    labetalol (NORMODYNE) 200 MG tablet, TAKE 1 TABLET BY MOUTH EVERY DAY, Disp: 30 tablet, Rfl: 2   loratadine (CLARITIN) 10 MG tablet, Take 1 tablet (10 mg total) by mouth daily., Disp: 30 tablet, Rfl: 6   losartan (COZAAR) 50 MG tablet, TAKE 1 TABLET BY MOUTH EVERY DAY, Disp: 90 tablet, Rfl: 0   methocarbamol (ROBAXIN) 500 MG tablet, Take 1 tablet (500 mg total) by mouth 2 (two) times daily., Disp: 20 tablet, Rfl: 0   mirtazapine (REMERON) 30 MG tablet, Take 1 tablet (30 mg total) by mouth at bedtime., Disp: 30 tablet, Rfl: 2   naproxen (NAPROSYN) 500 MG tablet, Take 1 tablet (500 mg total) by mouth 2 (two) times daily., Disp: 90 tablet, Rfl: 1   Omega-3 Fatty Acids (FISH OIL) 1000 MG CAPS, Take 1 capsule by mouth daily., Disp: , Rfl:    ondansetron (ZOFRAN-ODT) 4 MG disintegrating tablet, Take 1 tablet (4 mg total) by mouth every 8 (eight)  hours as needed for nausea or vomiting., Disp: 20 tablet, Rfl: 0   oxymetazoline (AFRIN NASAL SPRAY) 0.05 % nasal spray, Place 1 spray into both nostrils 2 (two) times daily., Disp: 15 mL, Rfl: 0   sucralfate (CARAFATE) 1 g tablet, Take 1 tablet (1 g total) by mouth 4 (four) times daily -  with meals and at bedtime., Disp: 120 tablet, Rfl: 0  Orders Placed This Encounter  Procedures   Lipid Panel With LDL/HDL Ratio   LDL cholesterol, direct   CMP14+EGFR   EKG 12-Lead   PCV ECHOCARDIOGRAM COMPLETE    There are no Patient Instructions on file for this visit.   --Continue cardiac medications as reconciled in final medication list. --Return in about 6 months (around 10/13/2022) for Follow up, Lipid, Dyspnea. Or sooner if needed. --Continue follow-up with your primary care physician regarding the management of your other chronic comorbid conditions.  Patient's questions and concerns were addressed to her satisfaction. She voices understanding of the instructions provided during this encounter.   This note was created using a voice recognition software as a result there may be grammatical errors inadvertently enclosed that do not reflect the nature of this encounter. Every attempt is made to correct such errors.  Rex Kras, [place], [facility]  Pager:  [phone] Office: [phone]

## 2022-04-15 ENCOUNTER — Ambulatory Visit: Payer: Medicaid Other | Admitting: Cardiology

## 2022-04-17 NOTE — Patient Instructions (Signed)
Thank you for attending your appointment today.  -- We did not make any medication changes today. Please continue medications as prescribed.  Please do not make any changes to medications without first discussing with your provider. If you are experiencing a psychiatric emergency, please call 911 or present to your nearest emergency department. Additional crisis, medication management, and therapy resources are included below.  [facility]  [address], [place], [place] [address] [phone] WALK-IN URGENT CARE 24/7 FOR ANYONE [address], [place],   [phone] Fax: [phone] [email or web address] *Interpreters available *Accepts all insurance and uninsured for Urgent Care needs *Accepts Medicaid and uninsured for outpatient treatment (below)      ONLY FOR [place] Residents  Below:    Outpatient New Patient Assessment/Therapy Walk-ins:        Monday -Thursday 8am until slots are full.        Every Friday 1pm-4pm  (first come, first served)                   New Patient Psychiatry/Medication Management        Monday-Friday 8am-11am (first come, first served)               For all walk-ins we ask that you arrive by 7:15am, because patients will be seen in the order of arrival.   

## 2022-04-17 NOTE — Progress Notes (Unsigned)
[place] MD Outpatient Progress Note  04/22/2022 11:40 AM Erin Pollard  MRN:  [id number]  Assessment:  [name] presents for follow-up evaluation. Today, 04/22/22, patient reports she presented to ED early March for episode of dizziness and fatigue and is undergoing cardiac/medical workup. Outside of this, however, she does not feel anxiety attacks have increased in intensity or frequency and feels anxiety remains overall manageable without interfering with daily activities or mood. She expresses desire to continue current regimen as prescribed; we discussed need to consider alternatives to Klonopin if underlying anxiety worsens as it will not be further increased and she expressed understanding.   Plan to return to care in 2 months.  Identifying Information: Erin Pollard is a 52 y.o. female with a history of generalized anxiety disorder with panic attacks, HTN, past MI (2017), and IBS who is an established patient with [facility] participating in follow-up via video conferencing.   Plan:  # GAD with panic attacks Past medication trials: Zoloft, Valium (nausea), patient reports "numerous others" that requires further exploration Status of problem: chronic with mild exacerbation Interventions: -- Continue mirtazapine 30 mg qHS -- Continue Klonopin 0.5 mg TID PRN panic attacks (patient using TID)  -- Risks of long-term use have been extensively discussed and patient expressed understanding and desire to continue as she finds it effective with absence of side effects  -- PDMP reviewed with appropriate filling -- Continue individual psychotherapy with Guadlupe Spanish, [facility]  Patient was given contact information for behavioral health clinic and was instructed to call 911 for emergencies.   Subjective:  Chief Complaint:  Chief Complaint  Patient presents with   Medication Management    Interval History:   Last seen by resident MD Dr. Rosita Kea on 02/04/22. At that  time, managed on: Mirtazapine 30 mg qHS Klonopin 0.5 mg TID PRN No changes to medications made at that time.  Seen by cardiology March 2024 due to dizziness and fatigue. Felt to be noncardiac; EKG nonischemic. Echo ordered.  Today, reports she presented to ED for dizziness that was likely related to anxiety however undergoing medical workup. Identifies underlying stress related to son struggling with drug addiction and being homeless; relationship with sister however denies feeling that anxiety is unmanageable or panic attacks are occurring more frequently. Finds current regimen helpful and denies dizziness or fatigue associated with Klonopin. Has been told she is perimenopausal and feels recent episodes of dizziness may be related to perimenopausal symptoms. Does not feel anxiety is interfering with daily activities or enjoyment of life. Expresses desire to continue current regimen as prescribed while ruling out cardiac/other medical causes of recent dizziness. Encouraged to monitor frequency of panic attacks and discussed that if anxiety is worsening or physical sx are felt to be AE of medications, will need to explore alternative options to Klonopin.   PDMP: -- Klonopin 0.5 mg QTY 90 last filled 04/03/22 (rx dating back to 2021)   Visit Diagnosis:    ICD-10-CM   1. Generalized anxiety disorder with panic attacks  F41.1 clonazePAM (KLONOPIN) 0.5 MG tablet   F41.0 mirtazapine (REMERON) 30 MG tablet      Past Psychiatric History:  Diagnoses: GAD with panic attacks Medication trials: Zoloft, Valium (nausea), patient reports "numerous others" that requires further exploration Hospitalizations: x1 in 1996 for suicide attempt Suicide attempts: x1 in 1997 via cutting Substance use: denies use of etoh, tobacco, or illicit drugs  -- Tobacco: quit smoking 2017  Past Medical History:  Past Medical  History:  Diagnosis Date   Anxiety    Cervical radiculopathy    GERD (gastroesophageal reflux  disease)    Hypertension    Myocardial infarction    Panic attack     Past Surgical History:  Procedure Laterality Date   LEFT HEART CATH     TUBAL LIGATION      Family Psychiatric History:  Reports 2 of her sons have struggled with substance use  Family History:  Family History  Problem Relation Age of Onset   Heart attack Mother    Cancer Father    Stroke Father    Stroke Sister    Heart disease Maternal Grandmother    Heart attack Maternal Grandmother    Heart disease Maternal Grandfather    Stroke Maternal Grandfather     Social History:  Social History   Socioeconomic History   Marital status: Divorced    Spouse name: Not on file   Number of children: 4   Years of education: Not on file   Highest education level: Not on file  Occupational History   Not on file  Tobacco Use   Smoking status: Former    Types: Cigarettes    Quit date: 2017    Years since quitting: 7.2   Smokeless tobacco: Not on file  Vaping Use   Vaping Use: Never used  Substance and Sexual Activity   Alcohol use: Not Currently   Drug use: Never   Sexual activity: Yes  Other Topics Concern   Not on file  Social History Narrative   Not on file   Social Determinants of Health   Financial Resource Strain: Not on file  Food Insecurity: Not on file  Transportation Needs: Not on file  Physical Activity: Not on file  Stress: Not on file  Social Connections: Not on file    Allergies:  Allergies  Allergen Reactions   Amlodipine Hives    Achy legs and s.o.b Achy legs and s.o.b    Hydrocodone-Acetaminophen Hives and Rash   Iodinated Contrast Media Hives   Iodine Hives and Rash   Codeine    Hydrocortisone-Iodoquinol    Propoxyphene     Current Medications: Current Outpatient Medications  Medication Sig Dispense Refill   hydrALAZINE (APRESOLINE) 25 MG tablet Take 1 tablet (25 mg total) by mouth 3 (three) times daily. 270 tablet 3   hydrochlorothiazide (MICROZIDE) 12.5 MG  capsule Take 1 capsule (12.5 mg total) by mouth daily. 90 capsule 1   labetalol (NORMODYNE) 200 MG tablet TAKE 1 TABLET BY MOUTH EVERY DAY 30 tablet 2   atorvastatin (LIPITOR) 40 MG tablet Take 1 tablet (40 mg total) by mouth at bedtime. 90 tablet 3   Bempedoic Acid-Ezetimibe (NEXLIZET) 180-10 MG TABS Take 1 tablet by mouth daily. 90 tablet 1   cholecalciferol (VITAMIN D3) 25 MCG (1000 UNIT) tablet Take 1,000 Units by mouth daily.     [START ON 05/03/2022] clonazePAM (KLONOPIN) 0.5 MG tablet Take 1 tablet (0.5 mg total) by mouth 3 (three) times daily. 90 tablet 2   famotidine (PEPCID) 20 MG tablet Take 20 mg by mouth as needed.     ferrous sulfate 325 (65 FE) MG EC tablet TAKE 1 TABLET BY MOUTH EVERY DAY 90 tablet 0   KLOR-CON M10 10 MEQ tablet Take 10 mEq by mouth daily.     loratadine (CLARITIN) 10 MG tablet Take 1 tablet (10 mg total) by mouth daily. 30 tablet 6   losartan (COZAAR) 50 MG tablet TAKE 1  TABLET BY MOUTH EVERY DAY 90 tablet 0   methocarbamol (ROBAXIN) 500 MG tablet Take 1 tablet (500 mg total) by mouth 2 (two) times daily. 20 tablet 0   mirtazapine (REMERON) 30 MG tablet Take 1 tablet (30 mg total) by mouth at bedtime. 30 tablet 2   naproxen (NAPROSYN) 500 MG tablet Take 1 tablet (500 mg total) by mouth 2 (two) times daily. 90 tablet 1   Omega-3 Fatty Acids (FISH OIL) 1000 MG CAPS Take 1 capsule by mouth daily.     ondansetron (ZOFRAN-ODT) 4 MG disintegrating tablet Take 1 tablet (4 mg total) by mouth every 8 (eight) hours as needed for nausea or vomiting. 20 tablet 0   oxymetazoline (AFRIN NASAL SPRAY) 0.05 % nasal spray Place 1 spray into both nostrils 2 (two) times daily. 15 mL 0   sucralfate (CARAFATE) 1 g tablet Take 1 tablet (1 g total) by mouth 4 (four) times daily -  with meals and at bedtime. 120 tablet 0   No current facility-administered medications for this visit.    ROS: Does not endorse any physical complaints  Objective:  Psychiatric Specialty Exam: There  were no vitals taken for this visit.There is no height or weight on file to calculate BMI.  General Appearance: Casual and Well Groomed  Eye Contact:  Good  Speech:  Clear and Coherent and Normal Rate  Volume:  Normal  Mood:   "good"  Affect:   Euthymic, full range, bright  Thought Content:  Denies AVH; IOR; paranoia    Suicidal Thoughts:  No  Homicidal Thoughts:  No  Thought Process:  Goal Directed and Linear  Orientation:  Full (Time, Place, and Person)    Memory:   Grossly intact  Judgment:  Good  Insight:  Good  Concentration:  Concentration: Good  Recall:  NA  Fund of Knowledge: Good  Language: Good  Psychomotor Activity:  Normal  Akathisia:  No  AIMS (if indicated): not done  Assets:  Communication Skills Desire for Improvement Housing Leisure Time Resilience Social Support Talents/Skills Transportation  ADL's:  Intact  Cognition: WNL  Sleep:  Good   PE: General: sits comfortably in view of camera; no acute distress  Pulm: no increased work of breathing on room air  MSK: all extremity movements appear intact  Neuro: no focal neurological deficits observed  Gait & Station: unable to assess by video    Metabolic Disorder Labs: Lab Results  Component Value Date   HGBA1C 5.6 12/11/2021   No results found for: "PROLACTIN" Lab Results  Component Value Date   CHOL 173 11/14/2021   TRIG 65 11/14/2021   HDL 48 11/14/2021   CHOLHDL 5.0 09/14/2021   VLDL 11 09/14/2021   LDLCALC 112 (H) 11/14/2021   LDLCALC 154 (H) 09/19/2021   No results found for: "TSH"  Therapeutic Level Labs: No results found for: "LITHIUM" No results found for: "VALPROATE" No results found for: "CBMZ"  Screenings:  GAD-7    Flowsheet Row Office Visit from 12/11/2021 in [facility] Video Visit from 10/02/2021 in [facility] Office Visit from 06/27/2021 in [facility] Office Visit from 04/19/2021 in [facility] Office Visit from 01/11/2021 in [facility]  Total GAD-7 Score 10 9 8 6  0      PHQ2-9    [place] Office Visit from 12/11/2021 in [facility] Video Visit from 10/02/2021 in [facility]  [place] Office Visit from 06/27/2021 in [facility] Office Visit from 04/19/2021 in [facility] Office Visit from 01/11/2021 in [facility]  PHQ-2 Total Score 4 1 1 1  0  PHQ-9 Total Score 6 -- -- -- 0      Flowsheet Row ED from 03/27/2022 in [facility] Emergency Department at [facility] Video Visit from 10/02/2021 in [facility] ED from 09/13/2021 in [facility] Emergency Department at [facility] No Risk No Risk No Risk       Collaboration of Care: Collaboration of Care: Medication Management AEB ongoing medication management, Psychiatrist AEB established with this provider, and Referral or follow-up with counselor/therapist AEB established with individual psychotherapy  Patient/Guardian was advised Release of Information must be obtained prior to any record release in order to collaborate their care with an outside provider. Patient/Guardian was advised if they have not already done so to contact the registration department to sign all necessary forms in order for [place] to release information regarding their care.   Consent: Patient/Guardian gives verbal consent for treatment and assignment of benefits for services provided during this visit. Patient/Guardian expressed understanding and agreed to proceed.   Televisit via video: I connected with patient on 04/22/22 at 11:00 AM EDT by a video enabled telemedicine application and verified that I am speaking with the correct person using two identifiers.  Location: Patient: home address in  [place] Provider: remote office in [employer]   I discussed the limitations of evaluation and management by telemedicine and the availability of in person appointments. The patient expressed understanding and agreed to proceed.  I discussed the assessment and treatment plan with the patient. The patient was provided an opportunity to ask questions and all were answered. The patient agreed with the plan and demonstrated an understanding of the instructions.   The patient was advised to call back or seek an in-person evaluation if the symptoms worsen or if the condition fails to improve as anticipated.  I provided 40 minutes of non-face-to-face time during this encounter.  Langley Flatley A Chloie Loney 04/22/2022, 11:40 AM

## 2022-04-22 ENCOUNTER — Telehealth (HOSPITAL_COMMUNITY): Payer: Medicaid Other | Admitting: Psychiatry

## 2022-04-22 ENCOUNTER — Telehealth (INDEPENDENT_AMBULATORY_CARE_PROVIDER_SITE_OTHER): Payer: Medicaid Other | Admitting: Psychiatry

## 2022-04-22 ENCOUNTER — Encounter (HOSPITAL_COMMUNITY): Payer: Self-pay | Admitting: Psychiatry

## 2022-04-22 ENCOUNTER — Other Ambulatory Visit: Payer: Self-pay

## 2022-04-22 DIAGNOSIS — F411 Generalized anxiety disorder: Secondary | ICD-10-CM | POA: Diagnosis not present

## 2022-04-22 DIAGNOSIS — F410 Panic disorder [episodic paroxysmal anxiety] without agoraphobia: Secondary | ICD-10-CM

## 2022-04-22 DIAGNOSIS — H0263 Xanthelasma of right eye, unspecified eyelid: Secondary | ICD-10-CM

## 2022-04-22 DIAGNOSIS — E7800 Pure hypercholesterolemia, unspecified: Secondary | ICD-10-CM

## 2022-04-22 MED ORDER — CLONAZEPAM 0.5 MG PO TABS: 0.5000 mg | ORAL_TABLET | Freq: Three times a day (TID) | ORAL | 2 refills | Status: DC

## 2022-04-22 MED ORDER — MIRTAZAPINE 30 MG PO TABS: 30.0000 mg | ORAL_TABLET | Freq: Every evening | ORAL | 2 refills | Status: DC

## 2022-04-22 MED ORDER — NEXLIZET 180-10 MG PO TABS: 1.0000 | ORAL_TABLET | Freq: Every day | ORAL | 1 refills | Status: DC

## 2022-04-23 ENCOUNTER — Other Ambulatory Visit: Payer: Self-pay

## 2022-04-23 ENCOUNTER — Other Ambulatory Visit: Payer: Self-pay | Admitting: Cardiology

## 2022-04-23 DIAGNOSIS — I10 Essential (primary) hypertension: Secondary | ICD-10-CM

## 2022-04-23 MED ORDER — HYDROCHLOROTHIAZIDE 12.5 MG PO CAPS: 12.5000 mg | ORAL_CAPSULE | Freq: Every day | ORAL | 1 refills | Status: DC

## 2022-04-30 ENCOUNTER — Ambulatory Visit (INDEPENDENT_AMBULATORY_CARE_PROVIDER_SITE_OTHER): Payer: Medicaid Other | Admitting: Primary Care

## 2022-05-06 ENCOUNTER — Other Ambulatory Visit: Payer: Self-pay

## 2022-05-06 ENCOUNTER — Emergency Department (HOSPITAL_COMMUNITY): Payer: Medicaid Other

## 2022-05-06 ENCOUNTER — Encounter (HOSPITAL_COMMUNITY): Payer: Self-pay

## 2022-05-06 ENCOUNTER — Emergency Department (HOSPITAL_COMMUNITY)
Admission: EM | Admit: 2022-05-06 | Discharge: 2022-05-06 | Disposition: A | Discharge status: Home / Self Care | Payer: Medicaid Other | Attending: Student | Admitting: Student

## 2022-05-06 ENCOUNTER — Ambulatory Visit (HOSPITAL_COMMUNITY): Payer: Medicaid Other | Admitting: Mental Health

## 2022-05-06 DIAGNOSIS — Z79899 Other long term (current) drug therapy: Secondary | ICD-10-CM | POA: Diagnosis not present

## 2022-05-06 DIAGNOSIS — E876 Hypokalemia: Secondary | ICD-10-CM | POA: Diagnosis not present

## 2022-05-06 DIAGNOSIS — R42 Dizziness and giddiness: Secondary | ICD-10-CM | POA: Diagnosis present

## 2022-05-06 DIAGNOSIS — R0609 Other forms of dyspnea: Secondary | ICD-10-CM

## 2022-05-06 DIAGNOSIS — I2510 Atherosclerotic heart disease of native coronary artery without angina pectoris: Secondary | ICD-10-CM | POA: Insufficient documentation

## 2022-05-06 DIAGNOSIS — I10 Essential (primary) hypertension: Secondary | ICD-10-CM | POA: Diagnosis not present

## 2022-05-06 DIAGNOSIS — D649 Anemia, unspecified: Secondary | ICD-10-CM | POA: Insufficient documentation

## 2022-05-06 DIAGNOSIS — D329 Benign neoplasm of meninges, unspecified: Secondary | ICD-10-CM

## 2022-05-06 DIAGNOSIS — Z87891 Personal history of nicotine dependence: Secondary | ICD-10-CM | POA: Insufficient documentation

## 2022-05-06 LAB — CBC WITH DIFFERENTIAL/PLATELET
Abs Immature Granulocytes: 0.01 10*3/uL (ref 0.00–0.07)
Basophils Absolute: 0 10*3/uL (ref 0.0–0.1)
Basophils Relative: 0 %
Eosinophils Absolute: 0.1 10*3/uL (ref 0.0–0.5)
Eosinophils Relative: 3 %
HCT: 34.6 % — ABNORMAL LOW (ref 36.0–46.0)
Hemoglobin: 11.2 g/dL — ABNORMAL LOW (ref 12.0–15.0)
Immature Granulocytes: 0 %
Lymphocytes Relative: 37 %
Lymphs Abs: 1.9 10*3/uL (ref 0.7–4.0)
MCH: 26.1 pg (ref 26.0–34.0)
MCHC: 32.4 g/dL (ref 30.0–36.0)
MCV: 80.7 fL (ref 80.0–100.0)
Monocytes Absolute: 0.7 10*3/uL (ref 0.1–1.0)
Monocytes Relative: 14 %
Neutro Abs: 2.3 10*3/uL (ref 1.7–7.7)
Neutrophils Relative %: 46 %
Platelets: 340 10*3/uL (ref 150–400)
RBC: 4.29 MIL/uL (ref 3.87–5.11)
RDW: 15.2 % (ref 11.5–15.5)
WBC: 5.1 10*3/uL (ref 4.0–10.5)
nRBC: 0 % (ref 0.0–0.2)

## 2022-05-06 LAB — BRAIN NATRIURETIC PEPTIDE: B Natriuretic Peptide: 22.3 pg/mL (ref 0.0–100.0)

## 2022-05-06 LAB — TROPONIN I (HIGH SENSITIVITY): Troponin I (High Sensitivity): <2 ng/L (ref <18)

## 2022-05-06 LAB — COMPREHENSIVE METABOLIC PANEL
ALT: 25 U/L (ref 0–44)
AST: 24 U/L (ref 15–41)
Albumin: 3.9 g/dL (ref 3.5–5.0)
Alkaline Phosphatase: 52 U/L (ref 38–126)
Anion gap: 8 (ref 5–15)
BUN: 10 mg/dL (ref 6–20)
CO2: 25 mmol/L (ref 22–32)
Calcium: 8.7 mg/dL — ABNORMAL LOW (ref 8.9–10.3)
Chloride: 103 mmol/L (ref 98–111)
Creatinine, Ser: 0.72 mg/dL (ref 0.44–1.00)
GFR, Estimated: >60 mL/min (ref >60)
Glucose, Bld: 96 mg/dL (ref 70–99)
Potassium: 3.1 mmol/L — ABNORMAL LOW (ref 3.5–5.1)
Sodium: 136 mmol/L (ref 135–145)
Total Bilirubin: 0.4 mg/dL (ref 0.3–1.2)
Total Protein: 7.4 g/dL (ref 6.5–8.1)

## 2022-05-06 LAB — D-DIMER, QUANTITATIVE: D-Dimer, Quant: <0.27 ug/mL-FEU (ref 0.00–0.50)

## 2022-05-06 MED ORDER — POTASSIUM CHLORIDE CRYS ER 20 MEQ PO TBCR
40.0000 meq | EXTENDED_RELEASE_TABLET | Freq: Once | ORAL | Status: AC
  Administered 2022-05-06: 40 meq via ORAL
  Filled 2022-05-06: qty 2

## 2022-05-06 MED ORDER — LORAZEPAM 1 MG PO TABS
1.0000 mg | ORAL_TABLET | Freq: Once | ORAL | Status: AC
  Administered 2022-05-06: 1 mg via ORAL
  Filled 2022-05-06: qty 1

## 2022-05-06 MED ORDER — MAGNESIUM OXIDE -MG SUPPLEMENT 400 (240 MG) MG PO TABS
800.0000 mg | ORAL_TABLET | Freq: Once | ORAL | Status: AC
  Administered 2022-05-06: 800 mg via ORAL
  Filled 2022-05-06: qty 2

## 2022-05-06 NOTE — ED Provider Notes (Signed)
[facility] EMERGENCY DEPARTMENT AT [facility] Provider Note  CSN: [id number] Arrival date & time: 05/06/22 1821  Chief Complaint(s) Fatigue and Shortness of Breath  HPI Erin Pollard is a 52 y.o. female with PMH anxiety and panic disorder, HTN, CAD, known frontal bone skull growth who presents emergency department for evaluation of dizziness and shortness of breath.  Patient states that over the last 48 hours she has developed worsening exertional dyspnea and chest tightness.  She states that she has spoken with her cardiologist Dr. Doylene Canard who has scheduled her for follow-up cardiac echo in 4 days.  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  She denies exertional diaphoresis, nausea, vomiting or other associated symptoms.  She states at rest she does not have any symptoms.   Past Medical History Past Medical History:  Diagnosis Date   Anxiety    Cervical radiculopathy    GERD (gastroesophageal reflux disease)    Hypertension    Myocardial infarction    Panic attack    Patient Active Problem List   Diagnosis Date Noted   Generalized anxiety disorder with panic attacks 06/27/2021   Home Medication(s) Prior to Admission medications   Medication Sig Start Date End Date Taking? Authorizing Provider  atorvastatin (LIPITOR) 40 MG tablet Take 1 tablet (40 mg total) by mouth at bedtime. 03/25/22   Tolia, Sunit, DO  Bempedoic Acid-Ezetimibe (NEXLIZET) 180-10 MG TABS Take 1 tablet by mouth daily. 04/15/22   Tolia, Sunit, DO  cholecalciferol (VITAMIN D3) 25 MCG (1000 UNIT) tablet Take 1,000 Units by mouth daily.    [provider]  clonazePAM (KLONOPIN) 0.5 MG tablet Take 1 tablet (0.5 mg total) by mouth 3 (three) times daily. 05/03/22 08/01/22  Bahraini, Sarah A  famotidine (PEPCID) 20 MG tablet Take 20 mg by mouth as needed.    [provider]  ferrous sulfate 325 (65 FE) MG EC tablet TAKE 1 TABLET BY MOUTH EVERY DAY  02/18/22   Grayce Sessions, NP  hydrALAZINE (APRESOLINE) 25 MG tablet Take 1 tablet (25 mg total) by mouth 3 (three) times daily. 03/25/22   Tolia, Sunit, DO  hydrochlorothiazide (MICROZIDE) 12.5 MG capsule Take 1 capsule (12.5 mg total) by mouth daily. 04/23/22   Tolia, Sunit, DO  KLOR-CON M10 10 MEQ tablet Take 10 mEq by mouth daily. 08/31/21   [provider]  labetalol (NORMODYNE) 200 MG tablet TAKE 1 TABLET BY MOUTH EVERY DAY 01/08/22   Tolia, Sunit, DO  loratadine (CLARITIN) 10 MG tablet Take 1 tablet (10 mg total) by mouth daily. 11/02/21   Grayce Sessions, NP  losartan (COZAAR) 50 MG tablet TAKE 1 TABLET BY MOUTH EVERY DAY 04/23/22   Tolia, Sunit, DO  methocarbamol (ROBAXIN) 500 MG tablet Take 1 tablet (500 mg total) by mouth 2 (two) times daily. 07/29/21   Carlisle Beers, FNP  mirtazapine (REMERON) 30 MG tablet Take 1 tablet (30 mg total) by mouth at bedtime. 04/22/22   Bahraini, Sarah A  naproxen (NAPROSYN) 500 MG tablet Take 1 tablet (500 mg total) by mouth 2 (two) times daily. 09/11/21   Tolia, Sunit, DO  Omega-3 Fatty Acids (FISH OIL) 1000 MG CAPS Take 1 capsule by mouth daily.    [provider]  ondansetron (ZOFRAN-ODT) 4 MG disintegrating tablet Take 1 tablet (4 mg total) by mouth every 8 (eight) hours as needed for nausea or vomiting. 07/29/21   Carlisle Beers, FNP  oxymetazoline (  AFRIN NASAL SPRAY) 0.05 % nasal spray Place 1 spray into both nostrils 2 (two) times daily. 11/02/21   Grayce Sessions, NP  sucralfate (CARAFATE) 1 g tablet Take 1 tablet (1 g total) by mouth 4 (four) times daily -  with meals and at bedtime. 04/20/21   Roxy Horseman, PA-C                                                                                                                                    Past Surgical History Past Surgical History:  Procedure Laterality Date   LEFT HEART CATH     TUBAL LIGATION     Family History Family History  Problem Relation Age of  Onset   Heart attack Mother    Cancer Father    Stroke Father    Stroke Sister    Heart disease Maternal Grandmother    Heart attack Maternal Grandmother    Heart disease Maternal Grandfather    Stroke Maternal Grandfather     Social History Social History   Tobacco Use   Smoking status: Former    Types: Cigarettes    Quit date: 2017    Years since quitting: 7.2  Vaping Use   Vaping Use: Never used  Substance Use Topics   Alcohol use: Not Currently   Drug use: Never   Allergies Amlodipine, Hydrocodone-acetaminophen, Iodinated contrast media, Iodine, Codeine, Hydrocortisone-iodoquinol, and Propoxyphene  Review of Systems Review of Systems  Respiratory:  Positive for chest tightness and shortness of breath.   Cardiovascular:  Positive for chest pain.  Neurological:  Positive for dizziness.    Physical Exam Vital Signs  I have reviewed the triage vital signs BP (!) 156/77   Pulse 72   Temp 98.3 F (36.8 C) (Oral)   Resp (!) 22   Ht 5' (1.524 m)   Wt 75.3 kg   LMP 05/06/2022   SpO2 97%   BMI 32.42 kg/m   Physical Exam Vitals and nursing note reviewed.  Constitutional:      General: She is not in acute distress.    Appearance: She is well-developed.  HENT:     Head: Normocephalic and atraumatic.  Eyes:     Conjunctiva/sclera: Conjunctivae normal.  Cardiovascular:     Rate and Rhythm: Normal rate and regular rhythm.     Heart sounds: No murmur heard. Pulmonary:     Effort: Pulmonary effort is normal. No respiratory distress.     Breath sounds: Normal breath sounds.  Abdominal:     Palpations: Abdomen is soft.     Tenderness: There is no abdominal tenderness.  Musculoskeletal:        General: No swelling.     Cervical back: Neck supple.  Skin:    General: Skin is warm and dry.     Capillary Refill: Capillary refill takes less than 2 seconds.  Neurological:     Mental Status: She is  alert.  Psychiatric:        Mood and Affect: Mood normal.      ED Results and Treatments Labs (all labs ordered are listed, but only abnormal results are displayed) Labs Reviewed  COMPREHENSIVE METABOLIC PANEL - Abnormal; Notable for the following components:      Result Value   Potassium 3.1 (*)    Calcium 8.7 (*)    All other components within normal limits  CBC WITH DIFFERENTIAL/PLATELET - Abnormal; Notable for the following components:   Hemoglobin 11.2 (*)    HCT 34.6 (*)    All other components within normal limits  BRAIN NATRIURETIC PEPTIDE  D-DIMER, QUANTITATIVE  TROPONIN I (HIGH SENSITIVITY)                                                                                                                          Radiology DG Chest 2 View  Result Date: 05/06/2022 CLINICAL DATA:  Chest pain. EXAM: CHEST - 2 VIEW COMPARISON:  X-ray 03/27/2022 and older FINDINGS: No consolidation, pneumothorax or effusion. No edema. Normal cardiopericardial silhouette. Overlapping cardiac leads. IMPRESSION: No acute cardiopulmonary disease Electronically Signed   By: Karen Kays M.D.   On: 05/06/2022 19:35    Pertinent labs & imaging results that were available during my care of the patient were reviewed by me and considered in my medical decision making (see MDM for details).  Medications Ordered in ED Medications - No data to display                                                                                                                                   Procedures Procedures  (including critical care time)  Medical Decision Making / ED Course   This patient presents to the ED for concern of dizziness, exertional dyspnea, chest tightness, this involves an extensive number of treatment options, and is a complaint that carries with it a high risk of complications and morbidity.  The differential diagnosis includes ACS, PE, anxiety, dehydration, electrolyte abnormality, intracranial mass  MDM: Patient seen in the emergency room for  evaluation of multiple complaints described above.  Physical exam largely unremarkable with no focal motor or sensory deficits.  No neurologic deficits.  Cardiopulmonary exam unremarkable.  Laboratory evaluation with a hemoglobin of 11.2 which is baseline for this patient.  Potassium minimally decreased at 3.1 and this was repleted in the ER.  Chest x-ray unremarkable.  ECG nonischemic with no ST elevations or depressions.  Given patient's previous abnormal CT, a CT head was performed that shows a 10 mm right frontal extra-axial mass most consistent with a meningioma.  I spoke with the neurosurgery NP on-call for Dr. Johnsie Cancel who is recommending outpatient follow-up and they will obtain MRI imaging in the outpatient setting.  High-sensitivity troponin is normal and D-dimer is reassuringly negative.  Patient has a heart score of 3 and has fairly close cardiology follow-up on Friday where she will be receiving an echo.  With negative troponins and known history of panic disorder, we did a small Ativan trial and on reevaluation her symptoms significantly improved.  She was able to ambulate without any difficulty or shortness of breath on ambulation.  Lower suspicion for cardiac exertional chest tightness here in the emergency department but she does have close cardiology follow-up on Friday.  Patient been discharged with outpatient neurosurgery and cardiology follow-up.  Amatory referral was placed.  Patient given strict return precautions of which she and her daughter voiced understanding.   Additional history obtained:  -External records from outside source obtained and reviewed including: Chart review including previous notes, labs, imaging, consultation notes   Lab Tests: -I ordered, reviewed, and interpreted labs.   The pertinent results include:   Labs Reviewed  COMPREHENSIVE METABOLIC PANEL - Abnormal; Notable for the following components:      Result Value   Potassium 3.1 (*)    Calcium 8.7 (*)     All other components within normal limits  CBC WITH DIFFERENTIAL/PLATELET - Abnormal; Notable for the following components:   Hemoglobin 11.2 (*)    HCT 34.6 (*)    All other components within normal limits  BRAIN NATRIURETIC PEPTIDE  D-DIMER, QUANTITATIVE  TROPONIN I (HIGH SENSITIVITY)      EKG   EKG Interpretation  Date/Time:    Ventricular Rate:    PR Interval:    QRS Duration:   QT Interval:    QTC Calculation:   R Axis:     Text Interpretation:        No ST elevations or depressions, normal sinus rhythm   Imaging Studies ordered: I ordered imaging studies including chest x-ray, CT head I independently visualized and interpreted imaging. I agree with the radiologist interpretation   Medicines ordered and prescription drug management: No orders of the defined types were placed in this encounter.   -I have reviewed the patients home medicines and have made adjustments as needed  Critical interventions none  Consultations Obtained: I requested consultation with the neurosurgery NP on-call for Dr. Johnsie Cancel,  and discussed lab and imaging findings as well as pertinent plan - they recommend: Outpatient follow-up   Cardiac Monitoring: The patient was maintained on a cardiac monitor.  I personally viewed and interpreted the cardiac monitored which showed an underlying rhythm of: NSR  Social Determinants of Health:  Factors impacting patients care include: none   Reevaluation: After the interventions noted above, I reevaluated the patient and found that they have :improved  Co morbidities that complicate the patient evaluation  Past Medical History:  Diagnosis Date   Anxiety    Cervical radiculopathy    GERD (gastroesophageal reflux disease)    Hypertension    Myocardial infarction    Panic attack       Dispostion: I considered admission for this patient, but at this time she does not meet inpatient criteria for admission and patient safe for  discharge  with outpatient follow-up and return precautions     Final Clinical Impression(s) / ED Diagnoses Final diagnoses:  None     @    Glendora Score, MD 05/07/22 [phone]

## 2022-05-06 NOTE — ED Notes (Addendum)
Ambulated pt with portable O2 monitor. Pt maintained O2 sats 95%-97% with no difficulty

## 2022-05-06 NOTE — ED Triage Notes (Signed)
Pt states that she called out EMS yesterday. Pt c/o chest tightness, along with dizziness and lightheadedness. Pt states she became [facility] and fatigued while washing dishes yesterday. Pt states she cannot lay down today without pain. Pt states palpation also makes pain worse.   Pt has a cardiologist appt on 4/19 for echo. Pt had similar symptoms in early March.   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

## 2022-05-10 ENCOUNTER — Ambulatory Visit: Payer: Medicaid Other

## 2022-05-10 DIAGNOSIS — R0609 Other forms of dyspnea: Secondary | ICD-10-CM

## 2022-05-15 ENCOUNTER — Other Ambulatory Visit: Payer: Self-pay | Admitting: Cardiology

## 2022-05-15 DIAGNOSIS — E7800 Pure hypercholesterolemia, unspecified: Secondary | ICD-10-CM

## 2022-05-15 DIAGNOSIS — H0263 Xanthelasma of right eye, unspecified eyelid: Secondary | ICD-10-CM

## 2022-05-15 NOTE — Progress Notes (Signed)
Gave patient results, she acknowledged understanding. Also reminded her to have those labs done.

## 2022-05-17 ENCOUNTER — Ambulatory Visit (INDEPENDENT_AMBULATORY_CARE_PROVIDER_SITE_OTHER): Payer: Medicaid Other | Admitting: Primary Care

## 2022-05-24 ENCOUNTER — Other Ambulatory Visit: Payer: Self-pay

## 2022-05-24 DIAGNOSIS — H0263 Xanthelasma of right eye, unspecified eyelid: Secondary | ICD-10-CM

## 2022-05-24 DIAGNOSIS — E7800 Pure hypercholesterolemia, unspecified: Secondary | ICD-10-CM

## 2022-05-24 MED ORDER — NEXLIZET 180-10 MG PO TABS
1.0000 | ORAL_TABLET | Freq: Every day | ORAL | 1 refills | Status: DC
Start: 2022-05-24 — End: 2022-10-08

## 2022-06-10 ENCOUNTER — Ambulatory Visit (INDEPENDENT_AMBULATORY_CARE_PROVIDER_SITE_OTHER): Payer: Medicaid Other | Admitting: Mental Health

## 2022-06-10 DIAGNOSIS — F410 Panic disorder [episodic paroxysmal anxiety] without agoraphobia: Secondary | ICD-10-CM

## 2022-06-10 DIAGNOSIS — F411 Generalized anxiety disorder: Secondary | ICD-10-CM
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
## 2022-06-11 ENCOUNTER — Ambulatory Visit: Payer: Medicaid Other

## 2022-06-11 ENCOUNTER — Ambulatory Visit: Payer: Medicaid Other | Admitting: Cardiology

## 2022-06-19 NOTE — Progress Notes (Signed)
BH MD Outpatient Progress Note  06/21/2022 12:10 PM Erin Pollard  MRN:  [id number]  Assessment:  Erin Pollard presents for follow-up evaluation. Today, 06/21/22, patient reports stress related to daughter's pregnancy and her own medical workup for recently identified meningioma. Despite these acute stressors, reports overall stability of mood and feels she has been able to manage anxiety fairly well (outside of 1 panic attack this interval leading to ED presentation). Discussed option to start additional agent for anxiety (suggested Cymbalta given vasomotor symptoms of menopause) however patient would like to defer for now as she monitors anxiety symptoms as acute stressors resolve.  RTC in approx. 2 months by video.  Identifying Information: Erin Pollard is a 52 y.o. female with a history of generalized anxiety disorder with panic attacks, HTN, past MI (2017), and IBS who is an established patient with [facility] participating in follow-up via video conferencing.   Plan:  # GAD with panic attacks Past medication trials: Zoloft, Valium (nausea), patient reports "numerous others" that requires further exploration Status of problem: chronic with mild exacerbation Interventions: -- Continue mirtazapine 30 mg qHS -- Continue Klonopin 0.5 mg TID PRN panic attacks (patient using TID)  -- Risks of long-term use have been extensively discussed and patient expressed understanding and desire to continue as she finds it effective with absence of side effects  -- PDMP reviewed with appropriate filling -- Continue individual psychotherapy with Erin Pollard, [facility]  Patient was given contact information for behavioral health clinic and was instructed to call 911 for emergencies.   Subjective:  Chief Complaint:  Chief Complaint  Patient presents with   Medication Management    Interval History:   Chart review: -- Presented to ED 05/06/22 for dizziness and  exertional dyspnea. Workup unremarkable - discharged with outpatient neurosurgery (for evaluation of known right frontal extra-axial mass c/f meningioma) and cardiology follow-up.  -- Echo 05/10/22 wnl  Today, patient reports daughter has been having a difficult pregnancy. Notes feeling overwhelmed related to daughter not having support she needs from others. Patient reports she was also found to have mass (likely meningioma) on head imaging with headaches and dizziness and has f/u today to get MRI. Reports when she went to hospital, Ativan did help with dizziness and identifies symptoms could be related to anxiety. Cardiac workup was normal.   Has not felt dizzy lately but has been experiencing perimenopause symptoms a/e/b hot flashes - no cycle in past 2 months. No vaginal dryness but has experienced this in the past. Has Ob/Gyn through [facility] and PCP through [facility]. Ob/Gyn has said she is in perimenopause but they have not started any treatment as menses are not yet too irregular.   Feels Remeron works well for her sleep. Discussed options for anxiety. Feels anxiety has actually remained fairly manageable as she only had 1 panic attack this interval (when she presented to ED). Mood has been fairly stable despite stressors. Remains motivated with good energy. Denies SI/HI. Discussed option to start additional agent for anxiety such as Cymbalta as this could be additionally helpful for vasomotor symptoms of perimenopause. Patient would like to monitor anxiety sx and defer for now as she receives further medical workup.  PDMP: -- Klonopin 0.5 mg QTY 90 last filled 06/02/22 (rx dating back to 2021)   Visit Diagnosis:    ICD-10-CM   1. Generalized anxiety disorder with panic attacks  F41.1 mirtazapine (REMERON) 30 MG tablet   F41.0 clonazePAM (KLONOPIN) 0.5 MG  tablet      Past Psychiatric History:  Diagnoses: GAD with panic attacks Medication trials: Zoloft, Valium (nausea), patient  reports "numerous others" that requires further exploration Hospitalizations: x1 in 1996 for suicide attempt Suicide attempts: x1 in 1997 via cutting Substance use: denies use of etoh, tobacco, or illicit drugs  -- Tobacco: quit smoking 2017  Past Medical History:  Past Medical History:  Diagnosis Date   Anxiety    Cervical radiculopathy    GERD (gastroesophageal reflux disease)    Hypertension    Myocardial infarction (HCC)    Panic attack     Past Surgical History:  Procedure Laterality Date   LEFT HEART CATH     TUBAL LIGATION      Family Psychiatric History:  Reports 2 of her sons have struggled with substance use  Family History:  Family History  Problem Relation Age of Onset   Heart attack Mother    Cancer Father    Stroke Father    Stroke Sister    Heart disease Maternal Grandmother    Heart attack Maternal Grandmother    Heart disease Maternal Grandfather    Stroke Maternal Grandfather     Social History:  Social History   Socioeconomic History   Marital status: Divorced    Spouse name: Not on file   Number of children: 4   Years of education: Not on file   Highest education level: Not on file  Occupational History   Not on file  Tobacco Use   Smoking status: Former    Types: Cigarettes    Quit date: 2017    Years since quitting: 7.4   Smokeless tobacco: Not on file  Vaping Use   Vaping Use: Never used  Substance and Sexual Activity   Alcohol use: Not Currently   Drug use: Never   Sexual activity: Yes  Other Topics Concern   Not on file  Social History Narrative   Not on file   Social Determinants of Health   Financial Resource Strain: Not on file  Food Insecurity: Not on file  Transportation Needs: Not on file  Physical Activity: Not on file  Stress: Not on file  Social Connections: Not on file    Allergies:  Allergies  Allergen Reactions   Amlodipine Hives    Achy legs and s.o.b Achy legs and s.o.b     Hydrocodone-Acetaminophen Hives and Rash   Iodinated Contrast Media Hives   Iodine Hives and Rash   Codeine    Hydrocortisone-Iodoquinol    Propoxyphene     Current Medications: Current Outpatient Medications  Medication Sig Dispense Refill   atorvastatin (LIPITOR) 40 MG tablet Take 1 tablet (40 mg total) by mouth at bedtime. 90 tablet 3   Bempedoic Acid-Ezetimibe (NEXLIZET) 180-10 MG TABS Take 1 tablet by mouth daily. 90 tablet 1   cholecalciferol (VITAMIN D3) 25 MCG (1000 UNIT) tablet Take 1,000 Units by mouth daily.     [START ON 07/02/2022] clonazePAM (KLONOPIN) 0.5 MG tablet Take 1 tablet (0.5 mg total) by mouth 3 (three) times daily. 90 tablet 2   famotidine (PEPCID) 20 MG tablet Take 20 mg by mouth as needed.     ferrous sulfate 325 (65 FE) MG EC tablet TAKE 1 TABLET BY MOUTH EVERY DAY 90 tablet 0   hydrALAZINE (APRESOLINE) 25 MG tablet Take 1 tablet (25 mg total) by mouth 3 (three) times daily. 270 tablet 3   hydrochlorothiazide (MICROZIDE) 12.5 MG capsule Take 1 capsule (12.5 mg total)  by mouth daily. 90 capsule 1   KLOR-CON M10 10 MEQ tablet Take 10 mEq by mouth daily.     labetalol (NORMODYNE) 200 MG tablet TAKE 1 TABLET BY MOUTH EVERY DAY 30 tablet 2   loratadine (CLARITIN) 10 MG tablet Take 1 tablet (10 mg total) by mouth daily. 30 tablet 6   losartan (COZAAR) 50 MG tablet TAKE 1 TABLET BY MOUTH EVERY DAY 90 tablet 3   methocarbamol (ROBAXIN) 500 MG tablet Take 1 tablet (500 mg total) by mouth 2 (two) times daily. 20 tablet 0   mirtazapine (REMERON) 30 MG tablet Take 1 tablet (30 mg total) by mouth at bedtime. 30 tablet 2   naproxen (NAPROSYN) 500 MG tablet Take 1 tablet (500 mg total) by mouth 2 (two) times daily. 90 tablet 1   Omega-3 Fatty Acids (FISH OIL) 1000 MG CAPS Take 1 capsule by mouth daily.     ondansetron (ZOFRAN-ODT) 4 MG disintegrating tablet Take 1 tablet (4 mg total) by mouth every 8 (eight) hours as needed for nausea or vomiting. 20 tablet 0   oxymetazoline  (AFRIN NASAL SPRAY) 0.05 % nasal spray Place 1 spray into both nostrils 2 (two) times daily. 15 mL 0   sucralfate (CARAFATE) 1 g tablet Take 1 tablet (1 g total) by mouth 4 (four) times daily -  with meals and at bedtime. 120 tablet 0   No current facility-administered medications for this visit.    ROS: Endorses hot flashes; episodes of dizziness  Objective:  Psychiatric Specialty Exam: There were no vitals taken for this visit.There is no height or weight on file to calculate BMI.  General Appearance: Casual and Well Groomed  Eye Contact:  Good  Speech:  Clear and Coherent and Normal Rate  Volume:  Normal  Mood:   "stressed but hanging in there"  Affect:   Euthymic, full range, bright  Thought Content:  Denies AVH; IOR; paranoia    Suicidal Thoughts:  No  Homicidal Thoughts:  No  Thought Process:  Goal Directed and Linear  Orientation:  Full (Time, Place, and Person)    Memory:   Grossly intact  Judgment:  Good  Insight:  Good  Concentration:  Concentration: Good  Recall:  NA  Fund of Knowledge: Good  Language: Good  Psychomotor Activity:  Normal  Akathisia:  No  AIMS (if indicated): not done  Assets:  Communication Skills Desire for Improvement Housing Leisure Time Resilience Social Support Talents/Skills Transportation  ADL's:  Intact  Cognition: WNL  Sleep:  Good   PE: General: sits comfortably in view of camera; no acute distress  Pulm: no increased work of breathing on room air  MSK: all extremity movements appear intact  Neuro: no focal neurological deficits observed  Gait & Station: unable to assess by video    Metabolic Disorder Labs: Lab Results  Component Value Date   HGBA1C 5.6 12/11/2021   No results found for: "PROLACTIN" Lab Results  Component Value Date   CHOL 173 11/14/2021   TRIG 65 11/14/2021   HDL 48 11/14/2021   CHOLHDL 5.0 09/14/2021   VLDL 11 09/14/2021   LDLCALC 112 (H) 11/14/2021   LDLCALC 154 (H) 09/19/2021   No results  found for: "TSH"  Therapeutic Level Labs: No results found for: "LITHIUM" No results found for: "VALPROATE" No results found for: "CBMZ"  Screenings:  GAD-7    Flowsheet Row Office Visit from 12/11/2021 in [facility] Video Visit from 10/02/2021 in [facility] Office Visit from 06/27/2021 in [facility] Office Visit from 04/19/2021 in [facility] Office Visit from 01/11/2021 in [facility]  Total GAD-7 Score 10 9 8 6  0      PHQ2-9    Flowsheet Row Office Visit from 12/11/2021 in [facility] Video Visit from 10/02/2021 in [facility] Office Visit from 06/27/2021 in [facility] Office Visit from 04/19/2021 in [facility] Office Visit from 01/11/2021 in [facility]  PHQ-2 Total Score 4 1 1 1  0  PHQ-9 Total Score 6 -- -- -- 0      Flowsheet Row ED from 05/06/2022 in [facility] from 03/27/2022 in [facility] Emergency Department at [facility] Video Visit from 10/02/2021 in [facility]  C-SSRS RISK CATEGORY No Risk No Risk No Risk       Collaboration of Care: Collaboration of Care: Medication Management AEB ongoing medication management, Psychiatrist AEB established with this provider, and Referral or follow-up with counselor/therapist AEB established with individual psychotherapy  Patient/Guardian was advised Release of Information must be obtained prior to any record release in order to collaborate their care with an outside provider. Patient/Guardian was advised if they have not already done so to contact the registration department to sign all necessary forms in order for [place] to release information regarding their care.   Consent:  Patient/Guardian gives verbal consent for treatment and assignment of benefits for services provided during this visit. Patient/Guardian expressed understanding and agreed to proceed.   Televisit via video: I connected with patient on 06/21/22 at 11:00 AM EDT by a video enabled telemedicine application and verified that I am speaking with the correct person using two identifiers.  Location: Patient: home address in [place] Provider: remote office in [place]   I discussed the limitations of evaluation and management by telemedicine and the availability of in person appointments. The patient expressed understanding and agreed to proceed.  I discussed the assessment and treatment plan with the patient. The patient was provided an opportunity to ask questions and all were answered. The patient agreed with the plan and demonstrated an understanding of the instructions.   The patient was advised to call back or seek an in-person evaluation if the symptoms worsen or if the condition fails to improve as anticipated.  I provided 30 minutes of non-face-to-face time during this encounter.  Naveah Brave A Davontay Watlington 06/21/2022, 12:10 PM

## 2022-06-21 ENCOUNTER — Encounter (HOSPITAL_COMMUNITY): Payer: Self-pay | Admitting: Psychiatry

## 2022-06-21 ENCOUNTER — Telehealth (INDEPENDENT_AMBULATORY_CARE_PROVIDER_SITE_OTHER): Payer: Medicaid Other | Admitting: Psychiatry

## 2022-06-21 DIAGNOSIS — F411 Generalized anxiety disorder: Secondary | ICD-10-CM | POA: Diagnosis not present

## 2022-06-21 DIAGNOSIS — F410 Panic disorder [episodic paroxysmal anxiety] without agoraphobia: Secondary | ICD-10-CM

## 2022-06-21 MED ORDER — CLONAZEPAM 0.5 MG PO TABS
0.5000 mg | ORAL_TABLET | Freq: Three times a day (TID) | ORAL | 2 refills | Status: DC
Start: 2022-07-02 — End: 2022-09-18

## 2022-06-21 MED ORDER — MIRTAZAPINE 30 MG PO TABS: 30.0000 mg | ORAL_TABLET | Freq: Every evening | ORAL | 2 refills | Status: DC

## 2022-06-21 NOTE — Patient Instructions (Signed)
Thank you for attending your appointment today.  -- We did not make any medication changes today. Please continue medications as prescribed.  Please do not make any changes to medications without first discussing with your provider. If you are experiencing a psychiatric emergency, please call 911 or present to your nearest emergency department. Additional crisis, medication management, and therapy resources are included below.  [facility]  [address], [place], [place] [address] [phone] WALK-IN URGENT CARE 24/7 FOR ANYONE [address], [place],   [phone] Fax: [phone] [email or web address] *Interpreters available *Accepts all insurance and uninsured for Urgent Care needs *Accepts Medicaid and uninsured for outpatient treatment (below)      ONLY FOR [place] Residents  Below:    Outpatient New Patient Assessment/Therapy Walk-ins:        Monday -Thursday 8am until slots are full.        Every Friday 1pm-4pm  (first come, first served)                   New Patient Psychiatry/Medication Management        Monday-Friday 8am-11am (first come, first served)               For all walk-ins we ask that you arrive by 7:15am, because patients will be seen in the order of arrival.   

## 2022-06-25 ENCOUNTER — Ambulatory Visit: Payer: Medicaid Other | Admitting: Cardiology

## 2022-06-26 ENCOUNTER — Other Ambulatory Visit: Payer: Self-pay | Admitting: Cardiology

## 2022-06-26 DIAGNOSIS — I10 Essential (primary) hypertension: Secondary | ICD-10-CM

## 2022-07-05 ENCOUNTER — Ambulatory Visit: Payer: Medicaid Other | Admitting: Cardiology

## 2022-07-05 ENCOUNTER — Encounter: Payer: Self-pay | Admitting: Cardiology

## 2022-07-05 VITALS — BP 134/79 | HR 78 | Ht 60.0 in | Wt 164.0 lb

## 2022-07-05 DIAGNOSIS — I10 Essential (primary) hypertension: Secondary | ICD-10-CM

## 2022-07-05 DIAGNOSIS — Z8249 Family history of ischemic heart disease and other diseases of the circulatory system: Secondary | ICD-10-CM

## 2022-07-05 DIAGNOSIS — E7800 Pure hypercholesterolemia, unspecified: Secondary | ICD-10-CM

## 2022-07-05 DIAGNOSIS — H0263 Xanthelasma of right eye, unspecified eyelid: Secondary | ICD-10-CM

## 2022-07-05 DIAGNOSIS — Z87891 Personal history of nicotine dependence: Secondary | ICD-10-CM

## 2022-07-05 DIAGNOSIS — R7989 Other specified abnormal findings of blood chemistry: Secondary | ICD-10-CM

## 2022-07-05 LAB — LIPID PANEL WITH LDL/HDL RATIO
Cholesterol, Total: 139 mg/dL (ref 100–199)
HDL: 37 mg/dL — ABNORMAL LOW (ref >39)
LDL Chol Calc (NIH): 89 mg/dL (ref 0–99)
LDL/HDL Ratio: 2.4 ratio (ref 0.0–3.2)
Triglycerides: 62 mg/dL (ref 0–149)
VLDL Cholesterol Cal: 13 mg/dL (ref 5–40)

## 2022-07-05 LAB — CMP14+EGFR
ALT: 101 IU/L — ABNORMAL HIGH (ref 0–32)
AST: 75 IU/L — ABNORMAL HIGH (ref 0–40)
Albumin/Globulin Ratio: 1.8
Albumin: 4.5 g/dL (ref 3.8–4.9)
Alkaline Phosphatase: 88 IU/L (ref 44–121)
BUN/Creatinine Ratio: 13 (ref 9–23)
BUN: 10 mg/dL (ref 6–24)
Bilirubin Total: 0.3 mg/dL (ref 0.0–1.2)
CO2: 26 mmol/L (ref 20–29)
Calcium: 9.9 mg/dL (ref 8.7–10.2)
Chloride: 102 mmol/L (ref 96–106)
Creatinine, Ser: 0.76 mg/dL (ref 0.57–1.00)
Globulin, Total: 2.5 g/dL (ref 1.5–4.5)
Glucose: 111 mg/dL — ABNORMAL HIGH (ref 70–99)
Potassium: 4 mmol/L (ref 3.5–5.2)
Sodium: 143 mmol/L (ref 134–144)
Total Protein: 7 g/dL (ref 6.0–8.5)
eGFR: 94 mL/min/{1.73_m2} (ref >59)

## 2022-07-05 LAB — LDL CHOLESTEROL, DIRECT: LDL Direct: 84 mg/dL (ref 0–99)

## 2022-07-05 NOTE — Progress Notes (Signed)
Date:  07/05/2022   ID:  Erin Pollard, DOB 1970/10/15, MRN [id number]  PCP:  Grayce Sessions, NP  Cardiologist:  Tessa Lerner, DO, [facility] (established care February 01, 2021) Former Cardiology Providers: Dr. Aniceto Boss [facility], [place])  Date: 07/05/22 Last Office Visit: 04/12/2022  Chief Complaint  Patient presents with   Dyspnea on exertion   Follow-up    HPI  Erin Pollard is a 52 y.o. African-American female whose past medical history and cardiovascular risk factors include: Panic attack, anxiety, hyperlipidemia, history of myocardial infarction (04/2015) no intervention, family history of premature CAD, benign essential hypertension, GERD, former smoker, obesity due to excess calories.   Patient was initially referred to the practice for evaluation of chest pain and she has undergone appropriate workup.  On physical examination she was noted to have xanthelasmas and initial lipids were not well-controlled.  Her total cholesterol used to be 223 and LDL 158 mg/dL.  She is currently on maximally tolerated dose of statin and at last office visit Nexlizet was initiated.  She was asked to have repeat labs after being on therapy for 6 weeks and now she presents for follow-up.  Please last office visit patient states that she is able to tolerate Nexlizet; however, is concerned as recent labs noted elevated AST and ALT.  She does have a gastroenterology who she follows on a regular basis.  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  Patient states that she did follow-up at that time does not recall what the outcomes were.  Dyspnea on exertion that was noted at the last visit is no longer present.  Echocardiogram results reviewed and noted below for further reference.  Family history of premature CAD with mother having a myocardial infarction at the age of 39 and passed.  FUNCTIONAL STATUS: No structured exercise program or daily routine.    ALLERGIES: Allergies  Allergen Reactions   Amlodipine Hives    Achy legs and s.o.b Achy legs and s.o.b    Hydrocodone-Acetaminophen Hives and Rash   Iodinated Contrast Media Hives   Iodine Hives and Rash   Codeine    Hydrocortisone-Iodoquinol    Propoxyphene     MEDICATION LIST PRIOR TO VISIT: Current Meds  Medication Sig   atorvastatin (LIPITOR) 40 MG tablet Take 1 tablet (40 mg total) by mouth at bedtime.   Bempedoic Acid-Ezetimibe (NEXLIZET) 180-10 MG TABS Take 1 tablet by mouth daily.   cholecalciferol (VITAMIN D3) 25 MCG (1000 UNIT) tablet Take 1,000 Units by mouth daily.   clonazePAM (KLONOPIN) 0.5 MG tablet Take 1 tablet (0.5 mg total) by mouth 3 (three) times daily.   famotidine (PEPCID) 20 MG tablet Take 20 mg by mouth as needed.   ferrous sulfate 325 (65 FE) MG EC tablet TAKE 1 TABLET BY MOUTH EVERY DAY   hydrALAZINE (APRESOLINE) 25 MG tablet Take 1 tablet (25 mg total) by mouth 3 (three) times daily.   hydrochlorothiazide (MICROZIDE) 12.5 MG capsule Take 1 capsule (12.5 mg total) by mouth daily.   KLOR-CON M10 10 MEQ tablet Take 10 mEq by mouth daily.   labetalol (NORMODYNE) 200 MG tablet TAKE 1 TABLET BY MOUTH EVERY DAY   loratadine (CLARITIN) 10 MG tablet Take 1 tablet (10 mg total) by mouth daily.   losartan (COZAAR) 50 MG tablet TAKE 1 TABLET BY MOUTH EVERY DAY   methocarbamol (ROBAXIN) 500 MG tablet Take 1 tablet (500 mg total) by mouth 2 (two) times daily.  mirtazapine (REMERON) 30 MG tablet Take 1 tablet (30 mg total) by mouth at bedtime.   naproxen (NAPROSYN) 500 MG tablet Take 1 tablet (500 mg total) by mouth 2 (two) times daily.   Omega-3 Fatty Acids (FISH OIL) 1000 MG CAPS Take 1 capsule by mouth daily.   ondansetron (ZOFRAN-ODT) 4 MG disintegrating tablet Take 1 tablet (4 mg total) by mouth every 8 (eight) hours as needed for nausea or vomiting.   oxymetazoline (AFRIN NASAL SPRAY) 0.05 % nasal spray Place 1 spray into both nostrils 2 (two) times daily.    sucralfate (CARAFATE) 1 g tablet Take 1 tablet (1 g total) by mouth 4 (four) times daily -  with meals and at bedtime.     PAST MEDICAL HISTORY: Past Medical History:  Diagnosis Date   Anxiety    Cervical radiculopathy    GERD (gastroesophageal reflux disease)    Hypertension    Myocardial infarction (HCC)    Panic attack     PAST SURGICAL HISTORY: Past Surgical History:  Procedure Laterality Date   LEFT HEART CATH     TUBAL LIGATION      FAMILY HISTORY: The patient family history includes Cancer in her father; Heart attack in her maternal grandmother and mother; Heart disease in her maternal grandfather and maternal grandmother; Stroke in her father, maternal grandfather, and sister.  SOCIAL HISTORY:  The patient  reports that she quit smoking about 7 years ago. Her smoking use included cigarettes. She does not have any smokeless tobacco history on file. She reports that she does not currently use alcohol. She reports that she does not use drugs.  REVIEW OF SYSTEMS: Review of Systems  Cardiovascular:  Negative for chest pain, claudication, dyspnea on exertion, irregular heartbeat, leg swelling, near-syncope, orthopnea, palpitations, paroxysmal nocturnal dyspnea and syncope.  Respiratory:  Negative for shortness of breath.   Hematologic/Lymphatic: Negative for bleeding problem.  Musculoskeletal:  Negative for muscle cramps and myalgias.  Neurological:  Negative for dizziness and light-headedness.    PHYSICAL EXAM:    07/05/2022   11:35 AM 05/06/2022   10:09 PM 05/06/2022    7:45 PM  Vitals with BMI  Height 5\' 0"     Weight 164 lbs    BMI 32.03    Systolic 134 150 295  Diastolic 79 77 80  Pulse 78 80 67   Physical Exam Eyes:     Comments: Bilateral upper lid xanthelasmas are improving  Cardiovascular:     Rate and Rhythm: Normal rate and regular rhythm.     Pulses: Normal pulses.          Carotid pulses are 2+ on the right side and 2+ on the left side.       Radial pulses are 2+ on the right side and 2+ on the left side.       Dorsalis pedis pulses are 2+ on the right side and 2+ on the left side.     Heart sounds: Normal heart sounds. No murmur heard.    No gallop.  Pulmonary:     Effort: Pulmonary effort is normal. No respiratory distress.     Breath sounds: Normal breath sounds. No wheezing.  Musculoskeletal:     Right lower leg: No edema.     Left lower leg: No edema.    RADIOLOGY DATABASE: Chest x-ray 11/13/2020: No active cardiopulmonary disease.  CARDIAC DATABASE: EKG  04/12/2022: Sinus rhythm, 66 bpm, LAE, without underlying ischemia or injury pattern.  No significant change compared to  12/11/2021  Echocardiogram: 04/2016 [facility] available in Care Everywhere: LVEF 60 to 65%, normal diastolic filling pattern, normal right ventricular size and function, normal left atrial size, no significant valvular heart disease.  02/23/2021: Normal LV systolic function with visual EF 60-65%. Left ventricle cavity is normal in size. Normal left ventricular wall thickness. Normal global wall motion. Normal diastolic filling pattern, normal LAP. No significant valvular heart disease. Anechoic structure within the liver parenchyma measuring 3.34 x 4.05cm likely a cyst, consider dedicated ultrasound of abdomen if clinically indicated.  Compared to outside study 04/2016 no significant change.   05/10/2022:  Normal LV systolic function with visual EF 60-65%. Left ventricle cavity is normal in size. Normal left ventricular wall thickness. Normal global wall motion. Normal diastolic filling pattern, normal LAP. Calculated EF 66%.  Structurally normal tricuspid valve with no regurgitation. No evidence of pulmonary hypertension.  No significant change compared to 02/2021.   Stress Testing: Exercise treadmill stress test 02/23/2021: Functional status: Fair. Chest pain: No. Reason for stopping exercise: Dyspnea, patient  request. Hypertensive response to exercise: No. Exercise time 5 minutes 59 seconds on Bruce protocol, achieved 7.05 METS, 88% of age-predicted maximum heart rate (APMHR).  Stress ECG positive for ischemia.  Intermediate risk study. Consider further cardiac work up if clinically indicated. Clinical correlation required.   Exercise nuclear stress test 04/02/2021: Normal ECG stress. The patient exercised for 5 minutes and 0 seconds of a Bruce protocol, achieving approximately 7.05 METs. Exercise capacity reduced. The blood pressure response was normal. Myocardial perfusion is normal. Overall LV systolic function is normal without regional wall motion abnormalities. Stress LV EF: 69%.  No previous exam available for comparison. Low risk.  Heart Catheterization: Last left heart catheterization April 2017 per patient.  We will request records.  LABORATORY DATA:    Latest Ref Rng & Units 05/06/2022    7:20 PM 03/27/2022    6:10 PM 12/11/2021    3:49 PM  CBC  WBC 4.0 - 10.5 K/uL 5.1  6.2  5.8   Hemoglobin 12.0 - 15.0 g/dL 16.1  09.6  04.5   Hematocrit 36.0 - 46.0 % 34.6  36.7  36.7   Platelets 150 - 400 K/uL 340  328  310        Latest Ref Rng & Units 07/04/2022    8:01 AM 05/06/2022    7:20 PM 03/27/2022    6:10 PM  CMP  Glucose 70 - 99 mg/dL 409  96  811   BUN 6 - 24 mg/dL 10  10  9    Creatinine 0.57 - 1.00 mg/dL 9.14  7.82  9.56   Sodium 134 - 144 mmol/L 143  136  137   Potassium 3.5 - 5.2 mmol/L 4.0  3.1  3.6   Chloride 96 - 106 mmol/L 102  103  104   CO2 20 - 29 mmol/L 26  25  25    Calcium 8.7 - 10.2 mg/dL 9.9  8.7  9.0   Total Protein 6.0 - 8.5 g/dL 7.0  7.4  7.8   Total Bilirubin 0.0 - 1.2 mg/dL 0.3  0.4  0.5   Alkaline Phos 44 - 121 IU/L 88  52  77   AST 0 - 40 IU/L 75  24  16   ALT 0 - 32 IU/L 101  25  18     Lipid Panel  Lab Results  Component Value Date   CHOL 139 07/04/2022   HDL 37 (L) 07/04/2022  LDLCALC 89 07/04/2022   LDLDIRECT 84 07/04/2022   TRIG 62  07/04/2022   CHOLHDL 5.0 09/14/2021    BMP Recent Labs    09/14/21 0333 09/19/21 1635 03/27/22 1810 05/06/22 1920 07/04/22 0801  NA 140   < > 137 136 143  K 3.4*   < > 3.6 3.1* 4.0  CL 107   < > 104 103 102  CO2 26   < > 25 25 26   GLUCOSE 100*   < > 108* 96 111*  BUN 9   < > 9 10 10   CREATININE 0.65   < > 0.60 0.72 0.76  CALCIUM 8.9   < > 9.0 8.7* 9.9  GFRNONAA >60  --  >60 >60  --    < > = values in this interval not displayed.    HEMOGLOBIN A1C Lab Results  Component Value Date   HGBA1C 5.6 12/11/2021    IMPRESSION:    ICD-10-CM   1. Pure hypercholesterolemia  E78.00 CMP14+EGFR    2. Xanthelasma of eyelid, bilateral  H02.63    H02.66     3. Benign hypertension  I10     4. Former smoker  Z87.891     5. Family history of premature CAD  Z82.49     6. Abnormal LFTs  R79.89 CMP14+EGFR       RECOMMENDATIONS: Erin Pollard is a 52 y.o. African-American female whose past medical history and cardiac risk factors include: Panic attack, anxiety, history of myocardial infarction (04/2015) no intervention, family history of premature CAD, benign essential hypertension, GERD, former smoker, obesity due to excess calories.   Pure hypercholesterolemia Xanthelasma of eyelid, bilateral Initial LDL 158 mg/dL and on physical examination xanthelasmas noted bilaterally with family history of premature CAD as noted above. Currently on atorvastatin and Nexlizet. Most recent LDL 84 mg/dL. Most recent lipids note elevated AST and ALT levels. Will repeat CMP in 1 month if these levels remain elevated we will likely discontinue Nexlizet and consider PCSK9 inhibitors as an alternative.  In the past she was trying to avoid injectable medications if possible.  Benign hypertension Office blood pressures within acceptable limits. Medications reconciled.  Abnormal LFTs One of the prior echocardiograms noted a cyst within the liver parenchyma.  At that time she was recommended to  follow-up with gastroenterology/primary for further recommendations and evaluation.  At that time patient states that she was evaluated but does not recall what was recommended or to additional testing performed. I recommended that she follows up with her gastroenterologist for further guidance. Shared decision was to repeat a CMP in 1 month.  If the LFTs are still elevated we will discontinue Nexlizet.  FINAL MEDICATION LIST END OF ENCOUNTER: No orders of the defined types were placed in this encounter.   There are no discontinued medications.     Current Outpatient Medications:    atorvastatin (LIPITOR) 40 MG tablet, Take 1 tablet (40 mg total) by mouth at bedtime., Disp: 90 tablet, Rfl: 3   Bempedoic Acid-Ezetimibe (NEXLIZET) 180-10 MG TABS, Take 1 tablet by mouth daily., Disp: 90 tablet, Rfl: 1   cholecalciferol (VITAMIN D3) 25 MCG (1000 UNIT) tablet, Take 1,000 Units by mouth daily., Disp: , Rfl:    clonazePAM (KLONOPIN) 0.5 MG tablet, Take 1 tablet (0.5 mg total) by mouth 3 (three) times daily., Disp: 90 tablet, Rfl: 2   famotidine (PEPCID) 20 MG tablet, Take 20 mg by mouth as needed., Disp: , Rfl:    ferrous sulfate 325 (65  FE) MG EC tablet, TAKE 1 TABLET BY MOUTH EVERY DAY, Disp: 90 tablet, Rfl: 0   hydrALAZINE (APRESOLINE) 25 MG tablet, Take 1 tablet (25 mg total) by mouth 3 (three) times daily., Disp: 270 tablet, Rfl: 3   hydrochlorothiazide (MICROZIDE) 12.5 MG capsule, Take 1 capsule (12.5 mg total) by mouth daily., Disp: 90 capsule, Rfl: 1   KLOR-CON M10 10 MEQ tablet, Take 10 mEq by mouth daily., Disp: , Rfl:    labetalol (NORMODYNE) 200 MG tablet, TAKE 1 TABLET BY MOUTH EVERY DAY, Disp: 30 tablet, Rfl: 5   loratadine (CLARITIN) 10 MG tablet, Take 1 tablet (10 mg total) by mouth daily., Disp: 30 tablet, Rfl: 6   losartan (COZAAR) 50 MG tablet, TAKE 1 TABLET BY MOUTH EVERY DAY, Disp: 90 tablet, Rfl: 3   methocarbamol (ROBAXIN) 500 MG tablet, Take 1 tablet (500 mg total) by mouth 2  (two) times daily., Disp: 20 tablet, Rfl: 0   mirtazapine (REMERON) 30 MG tablet, Take 1 tablet (30 mg total) by mouth at bedtime., Disp: 30 tablet, Rfl: 2   naproxen (NAPROSYN) 500 MG tablet, Take 1 tablet (500 mg total) by mouth 2 (two) times daily., Disp: 90 tablet, Rfl: 1   Omega-3 Fatty Acids (FISH OIL) 1000 MG CAPS, Take 1 capsule by mouth daily., Disp: , Rfl:    ondansetron (ZOFRAN-ODT) 4 MG disintegrating tablet, Take 1 tablet (4 mg total) by mouth every 8 (eight) hours as needed for nausea or vomiting., Disp: 20 tablet, Rfl: 0   oxymetazoline (AFRIN NASAL SPRAY) 0.05 % nasal spray, Place 1 spray into both nostrils 2 (two) times daily., Disp: 15 mL, Rfl: 0   sucralfate (CARAFATE) 1 g tablet, Take 1 tablet (1 g total) by mouth 4 (four) times daily -  with meals and at bedtime., Disp: 120 tablet, Rfl: 0  Orders Placed This Encounter  Procedures   CMP14+EGFR    There are no Patient Instructions on file for this visit.   --Continue cardiac medications as reconciled in final medication list. --Return in about 5 weeks (around 08/09/2022) for Follow up, Lipid, elevated ALT and AST. Or sooner if needed. --Continue follow-up with your primary care physician regarding the management of your other chronic comorbid conditions.  Patient's questions and concerns were addressed to her satisfaction. She voices understanding of the instructions provided during this encounter.   This note was created using a voice recognition software as a result there may be grammatical errors inadvertently enclosed that do not reflect the nature of this encounter. Every attempt is made to correct such errors.  Tessa Lerner, [place], [facility]  Pager:  [phone] Office: [phone]

## 2022-07-15 ENCOUNTER — Telehealth: Payer: Self-pay

## 2022-07-15 NOTE — Telephone Encounter (Signed)
Pt called regarding labs from 11/2021 she asked you to look at them, about her liver enzymes were normal  she isn't able to see gastro until September , she sees pcp july 2

## 2022-07-17 NOTE — Telephone Encounter (Signed)
I did, current levels are above normal limits but not high enough to discontinue her cholesterol medications.  Either PCP or GI is fine.   Melquan Ernsberger [place], DO, [facility]

## 2022-07-19 ENCOUNTER — Encounter (HOSPITAL_COMMUNITY): Payer: Self-pay

## 2022-07-19 ENCOUNTER — Ambulatory Visit (HOSPITAL_COMMUNITY): Payer: Medicaid Other | Admitting: Mental Health

## 2022-07-19 DIAGNOSIS — F410 Panic disorder [episodic paroxysmal anxiety] without agoraphobia: Secondary | ICD-10-CM

## 2022-07-19 NOTE — Progress Notes (Signed)
Pt connected to tele-therapy session, in bed. Stated to feel unwell and in need of rescheduling. Therapist provided 8/9 @ 10 for follow up virtually. No safety concerns reported.

## 2022-07-23 ENCOUNTER — Encounter (INDEPENDENT_AMBULATORY_CARE_PROVIDER_SITE_OTHER): Payer: Self-pay

## 2022-07-23 ENCOUNTER — Ambulatory Visit (INDEPENDENT_AMBULATORY_CARE_PROVIDER_SITE_OTHER): Payer: Medicaid Other | Admitting: Primary Care

## 2022-07-31 DIAGNOSIS — Z1231 Encounter for screening mammogram for malignant neoplasm of breast: Secondary | ICD-10-CM | POA: Diagnosis not present

## 2022-08-05 ENCOUNTER — Emergency Department (HOSPITAL_COMMUNITY)
Admission: EM | Admit: 2022-08-05 | Discharge: 2022-08-06 | Disposition: A | Discharge status: Home / Self Care | Payer: Medicaid Other | Attending: Emergency Medicine | Admitting: Emergency Medicine

## 2022-08-05 ENCOUNTER — Other Ambulatory Visit: Payer: Self-pay

## 2022-08-05 ENCOUNTER — Encounter (HOSPITAL_COMMUNITY): Payer: Self-pay

## 2022-08-05 ENCOUNTER — Emergency Department (HOSPITAL_COMMUNITY): Payer: Medicaid Other

## 2022-08-05 DIAGNOSIS — S80211A Abrasion, right knee, initial encounter: Secondary | ICD-10-CM | POA: Insufficient documentation

## 2022-08-05 DIAGNOSIS — R220 Localized swelling, mass and lump, head: Secondary | ICD-10-CM | POA: Diagnosis not present

## 2022-08-05 DIAGNOSIS — S50311A Abrasion of right elbow, initial encounter: Secondary | ICD-10-CM | POA: Insufficient documentation

## 2022-08-05 DIAGNOSIS — D320 Benign neoplasm of cerebral meninges: Secondary | ICD-10-CM | POA: Diagnosis not present

## 2022-08-05 DIAGNOSIS — S90411A Abrasion, right great toe, initial encounter: Secondary | ICD-10-CM | POA: Insufficient documentation

## 2022-08-05 DIAGNOSIS — S99921A Unspecified injury of right foot, initial encounter: Secondary | ICD-10-CM | POA: Diagnosis not present

## 2022-08-05 DIAGNOSIS — S0003XA Contusion of scalp, initial encounter: Secondary | ICD-10-CM | POA: Diagnosis not present

## 2022-08-05 DIAGNOSIS — S0990XA Unspecified injury of head, initial encounter: Secondary | ICD-10-CM
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
## 2022-08-06 MED ORDER — BACITRACIN ZINC 500 UNIT/GM EX OINT
TOPICAL_OINTMENT | Freq: Once | CUTANEOUS | Status: AC
  Filled 2022-08-06: qty 0.9

## 2022-08-06 NOTE — Discharge Instructions (Signed)
Your imaging today did now show any acute injuries. Can continue wound care of the abrasions with soap/water.  Can use topical neosporin or similar if you like. Follow-up with your primary care doctor. Return here for new concerns.

## 2022-08-12 ENCOUNTER — Telehealth (HOSPITAL_COMMUNITY): Payer: Self-pay | Admitting: Psychiatry

## 2022-08-12 ENCOUNTER — Ambulatory Visit: Payer: Medicaid Other | Admitting: Cardiology

## 2022-08-13 ENCOUNTER — Telehealth (HOSPITAL_COMMUNITY): Payer: Self-pay | Admitting: Professional
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
## 2022-08-14 ENCOUNTER — Telehealth (HOSPITAL_COMMUNITY): Payer: Self-pay | Admitting: Professional

## 2022-08-15 DIAGNOSIS — D259 Leiomyoma of uterus, unspecified: Secondary | ICD-10-CM | POA: Diagnosis not present

## 2022-08-15 DIAGNOSIS — R102 Pelvic and perineal pain: Secondary | ICD-10-CM | POA: Diagnosis not present

## 2022-08-15 DIAGNOSIS — K640 First degree hemorrhoids: Secondary | ICD-10-CM | POA: Diagnosis not present

## 2022-08-15 DIAGNOSIS — Z0001 Encounter for general adult medical examination with abnormal findings: Secondary | ICD-10-CM | POA: Diagnosis not present

## 2022-08-20 NOTE — Telephone Encounter (Signed)
Open in error

## 2022-08-20 NOTE — Telephone Encounter (Signed)
Opened in error

## 2022-08-28 DIAGNOSIS — K449 Diaphragmatic hernia without obstruction or gangrene: Secondary | ICD-10-CM

## 2022-08-28 HISTORY — DX: Diaphragmatic hernia without obstruction or gangrene: K44.9
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
## 2022-08-30 ENCOUNTER — Telehealth (HOSPITAL_COMMUNITY): Payer: Self-pay | Admitting: Professional

## 2022-08-30 ENCOUNTER — Ambulatory Visit (HOSPITAL_COMMUNITY): Payer: Medicaid Other | Admitting: Mental Health

## 2022-08-30 ENCOUNTER — Telehealth (HOSPITAL_COMMUNITY): Payer: Medicaid Other | Admitting: Psychiatry

## 2022-08-30 DIAGNOSIS — F410 Panic disorder [episodic paroxysmal anxiety] without agoraphobia: Secondary | ICD-10-CM

## 2022-08-30 DIAGNOSIS — F411 Generalized anxiety disorder: Secondary | ICD-10-CM

## 2022-08-30 NOTE — Patient Instructions (Signed)
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
## 2022-09-10 ENCOUNTER — Ambulatory Visit: Payer: Medicaid Other | Admitting: Cardiology

## 2022-09-16 ENCOUNTER — Ambulatory Visit (HOSPITAL_COMMUNITY): Payer: Medicaid Other | Admitting: Mental Health

## 2022-09-16 DIAGNOSIS — F410 Panic disorder [episodic paroxysmal anxiety] without agoraphobia: Secondary | ICD-10-CM

## 2022-09-16 DIAGNOSIS — F411 Generalized anxiety disorder: Secondary | ICD-10-CM | POA: Diagnosis not present
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
## 2022-09-17 NOTE — Progress Notes (Unsigned)
BH MD Outpatient Progress Note  09/18/2022 3:09 PM BETTYLU KIERCE  MRN:  [id number]  Assessment:  [name] presents for follow-up evaluation. Today, 09/18/22, patient reports continued anxiety in face of numerous interpersonal and medical stressors; despite this she feels she has been able to rely on coping skills and supports to keep anxiety at manageable level. Denies hopelessness or SI. Notably, patient has not had any recent ED presentations for anxiety attacks despite acute stress. Patient may benefit from additional standing anxiolytic in the future however will defer while monitoring how symptoms resolve with time and as patient obtains clarity on medical conditions in upcoming specialist appointments.   RTC in approx. 2 months by video.  Identifying Information: CLARIZA STANBACK is a 52 y.o. female with a history of generalized anxiety disorder with panic attacks, HTN, past MI (2017), and IBS who is an established patient with [facility] participating in follow-up via video conferencing.   Plan:  # GAD with panic attacks Past medication trials: Zoloft, Valium (nausea), patient reports "numerous others" that requires further exploration Status of problem: chronic with mild exacerbation Interventions: -- Continue mirtazapine 30 mg qHS -- Continue Klonopin 0.5 mg TID PRN panic attacks (patient using TID)  -- Risks of long-term use have been extensively discussed and patient expressed understanding and desire to continue as she finds it effective with absence of side effects  -- PDMP reviewed with appropriate filling -- Continue individual psychotherapy with [name], [facility]  Patient was given contact information for behavioral health clinic and was instructed to call 911 for emergencies.   Subjective:  Chief Complaint:  Chief Complaint  Patient presents with   Medication Management    Interval History:   Asheli reports she is now back from  [place] and trying to get things back into place. Her daughter moved out and she has only been able to see her grandchildren once. Identifies stress from numerous medical conditions that need further evaluation: hiatal hernia, cysts on liver, meningioma. Has appointments coming up to further evaluate.   Continues to take psychiatric medications as prescribed; feels they work to keep anxiety manageable despite recent stressors. Reflects on how she has not had to go to ED for any anxiety attacks. Notes normal levels of sadness and tearfulness related to relationship difficulties with daughter and not being able to see grandchildren. Notes stress related to uncertainty around housing. Has reached out to housing resources provided by therapist. Identifies receiving a lot of support from family during this time.  Denies passive/active SI. Identifies that she has a lot to live for. Reports trip to [place] allowed her to laugh and smile again. Has been trying to keep her spirits up but some days takes more effort than others.   Amenable to continuing medications as prescribed and focusing on therapeutic support as well as obtaining clarity about medical conditions.  PDMP: -- Klonopin 0.5 mg QTY 90 last filled 08/30/22 (last rx in [place]) (rx dating back to 2021)  Visit Diagnosis:    ICD-10-CM   1. Generalized anxiety disorder with panic attacks  F41.1 mirtazapine (REMERON) 30 MG tablet   F41.0 clonazePAM (KLONOPIN) 0.5 MG tablet     Past Psychiatric History:  Diagnoses: GAD with panic attacks Medication trials: Zoloft, Valium (nausea), patient reports "numerous others" that requires further exploration Hospitalizations: x1 in 1996 for suicide attempt Suicide attempts: x1 in 1997 via cutting Substance use: denies use of etoh, tobacco, or illicit drugs  -- Tobacco: quit  smoking 2017  Past Medical History:  Past Medical History:  Diagnosis Date   Anxiety    Cervical radiculopathy    GERD  (gastroesophageal reflux disease)    Hypertension    Myocardial infarction (HCC)    Panic attack     Past Surgical History:  Procedure Laterality Date   LEFT HEART CATH     TUBAL LIGATION      Family Psychiatric History:  Reports 2 of her sons have struggled with substance use  Family History:  Family History  Problem Relation Age of Onset   Heart attack Mother    Cancer Father    Stroke Father    Stroke Sister    Heart disease Maternal Grandmother    Heart attack Maternal Grandmother    Heart disease Maternal Grandfather    Stroke Maternal Grandfather     Social History:  Social History   Socioeconomic History   Marital status: Divorced    Spouse name: Not on file   Number of children: 4   Years of education: Not on file   Highest education level: Associate degree: occupational, Scientist, product/process development, or vocational program  Occupational History   Not on file  Tobacco Use   Smoking status: Former    Current packs/day: 0.00    Types: Cigarettes    Quit date: 2017    Years since quitting: 7.6   Smokeless tobacco: Not on file  Vaping Use   Vaping status: Never Used  Substance and Sexual Activity   Alcohol use: Not Currently   Drug use: Never   Sexual activity: Yes  Other Topics Concern   Not on file  Social History Narrative   Not on file   Social Determinants of Health   Financial Resource Strain: Medium Risk (07/23/2022)   Overall Financial Resource Strain (CARDIA)    Difficulty of Paying Living Expenses: Somewhat hard  Food Insecurity: Food Insecurity Present (07/23/2022)   Hunger Vital Sign    Worried About Running Out of Food in the Last Year: Sometimes true    Ran Out of Food in the Last Year: Sometimes true  Transportation Needs: No Transportation Needs (07/23/2022)   PRAPARE - Administrator, Civil Service (Medical): No    Lack of Transportation (Non-Medical): No  Physical Activity: Sufficiently Active (07/23/2022)   Exercise Vital Sign    Days of  Exercise per Week: 5 days    Minutes of Exercise per Session: 30 min  Stress: No Stress Concern Present (07/23/2022)   [employer] of Occupational Health - Occupational Stress Questionnaire    Feeling of Stress : Only a little  Social Connections: Moderately Isolated (07/23/2022)   Social Connection and Isolation Panel [NHANES]    Frequency of Communication with Friends and Family: More than three times a week    Frequency of Social Gatherings with Friends and Family: More than three times a week    Attends Religious Services: 1 to 4 times per year    Active Member of [employer] or Organizations: No    Attends Engineer, structural: Not on file    Marital Status: Divorced    Allergies:  Allergies  Allergen Reactions   Amlodipine Hives    Achy legs and s.o.b Achy legs and s.o.b    Hydrocodone-Acetaminophen Hives and Rash   Iodinated Contrast Media Hives   Iodine Hives and Rash   Codeine    Hydrocortisone-Iodoquinol    Propoxyphene     Current Medications: Current Outpatient Medications  Medication Sig Dispense Refill   atorvastatin (LIPITOR) 40 MG tablet Take 1 tablet (40 mg total) by mouth at bedtime. 90 tablet 3   Bempedoic Acid-Ezetimibe (NEXLIZET) 180-10 MG TABS Take 1 tablet by mouth daily. 90 tablet 1   cholecalciferol (VITAMIN D3) 25 MCG (1000 UNIT) tablet Take 1,000 Units by mouth daily.     [START ON 09/29/2022] clonazePAM (KLONOPIN) 0.5 MG tablet Take 1 tablet (0.5 mg total) by mouth 3 (three) times daily. 90 tablet 2   famotidine (PEPCID) 20 MG tablet Take 20 mg by mouth as needed.     ferrous sulfate 325 (65 FE) MG EC tablet TAKE 1 TABLET BY MOUTH EVERY DAY 90 tablet 0   hydrALAZINE (APRESOLINE) 25 MG tablet Take 1 tablet (25 mg total) by mouth 3 (three) times daily. 270 tablet 3   hydrochlorothiazide (MICROZIDE) 12.5 MG capsule Take 1 capsule (12.5 mg total) by mouth daily. 90 capsule 1   KLOR-CON M10 10 MEQ tablet Take 10 mEq by mouth daily.     labetalol  (NORMODYNE) 200 MG tablet TAKE 1 TABLET BY MOUTH EVERY DAY 30 tablet 5   loratadine (CLARITIN) 10 MG tablet Take 1 tablet (10 mg total) by mouth daily. 30 tablet 6   losartan (COZAAR) 50 MG tablet TAKE 1 TABLET BY MOUTH EVERY DAY 90 tablet 3   methocarbamol (ROBAXIN) 500 MG tablet Take 1 tablet (500 mg total) by mouth 2 (two) times daily. 20 tablet 0   mirtazapine (REMERON) 30 MG tablet Take 1 tablet (30 mg total) by mouth at bedtime. 30 tablet 2   naproxen (NAPROSYN) 500 MG tablet Take 1 tablet (500 mg total) by mouth 2 (two) times daily. 90 tablet 1   Omega-3 Fatty Acids (FISH OIL) 1000 MG CAPS Take 1 capsule by mouth daily.     ondansetron (ZOFRAN-ODT) 4 MG disintegrating tablet Take 1 tablet (4 mg total) by mouth every 8 (eight) hours as needed for nausea or vomiting. 20 tablet 0   oxymetazoline (AFRIN NASAL SPRAY) 0.05 % nasal spray Place 1 spray into both nostrils 2 (two) times daily. 15 mL 0   sucralfate (CARAFATE) 1 g tablet Take 1 tablet (1 g total) by mouth 4 (four) times daily -  with meals and at bedtime. 120 tablet 0   No current facility-administered medications for this visit.    ROS: Does not endorse any physical complaints  Objective:  Psychiatric Specialty Exam: There were no vitals taken for this visit.There is no height or weight on file to calculate BMI.  General Appearance: Casual and Well Groomed  Eye Contact:  Good  Speech:  Clear and Coherent and Normal Rate  Volume:  Normal  Mood:   "hanging in there"  Affect:   Full range - appropriately tearful however brightens and laughs  Thought Content:  Denies AVH; IOR; paranoia    Suicidal Thoughts:  No  Homicidal Thoughts:  No  Thought Process:  Goal Directed and Linear  Orientation:  Full (Time, Place, and Person)    Memory:   Grossly intact  Judgment:  Good  Insight:  Good  Concentration:  Concentration: Good  Recall:  NA  Fund of Knowledge: Good  Language: Good  Psychomotor Activity:  Normal  Akathisia:   No  AIMS (if indicated): not done  Assets:  Communication Skills Desire for Improvement Housing Leisure Time Resilience Social Support Talents/Skills Transportation  ADL's:  Intact  Cognition: WNL  Sleep:  Good   PE: General: sits comfortably in  view of camera; no acute distress  Pulm: no increased work of breathing on room air  MSK: all extremity movements appear intact  Neuro: no focal neurological deficits observed  Gait & Station: unable to assess by video    Metabolic Disorder Labs: Lab Results  Component Value Date   HGBA1C 5.6 12/11/2021   No results found for: "PROLACTIN" Lab Results  Component Value Date   CHOL 139 07/04/2022   TRIG 62 07/04/2022   HDL 37 (L) 07/04/2022   CHOLHDL 5.0 09/14/2021   VLDL 11 09/14/2021   LDLCALC 89 07/04/2022   LDLCALC 112 (H) 11/14/2021   No results found for: "TSH"  Therapeutic Level Labs: No results found for: "LITHIUM" No results found for: "VALPROATE" No results found for: "CBMZ"  Screenings:  GAD-7    Flowsheet Row Office Visit from 12/11/2021 in [facility] Video Visit from 10/02/2021 in [facility] Office Visit from 06/27/2021 in [facility] Office Visit from 04/19/2021 in [facility] Office Visit from 01/11/2021 in [facility]  Total GAD-7 Score 10 9 8 6  0      PHQ2-9    Flowsheet Row Office Visit from 12/11/2021 in [facility] Video Visit from 10/02/2021 in [facility] Office Visit from 06/27/2021 in [facility] Office Visit from 04/19/2021 in [facility] Office Visit from 01/11/2021 in [facility]  PHQ-2 Total Score 4 1 1 1  0  PHQ-9 Total Score 6 -- -- -- 0      Flowsheet Row ED from 08/05/2022 in [facility] Emergency Department at  [facility] ED from 05/06/2022 in [facility] Emergency Department at [facility] ED from 03/27/2022 in [facility] Emergency Department at [facility]  C-SSRS RISK CATEGORY No Risk No Risk No Risk       Collaboration of Care: Collaboration of Care: Medication Management AEB ongoing medication management, Psychiatrist AEB established with this provider, and Referral or follow-up with counselor/therapist AEB established with individual psychotherapy  Patient/Guardian was advised Release of Information must be obtained prior to any record release in order to collaborate their care with an outside provider. Patient/Guardian was advised if they have not already done so to contact the registration department to sign all necessary forms in order for [place] to release information regarding their care.   Consent: Patient/Guardian gives verbal consent for treatment and assignment of benefits for services provided during this visit. Patient/Guardian expressed understanding and agreed to proceed.   Televisit via video: I connected with patient on 09/18/22 at  2:00 PM EDT by a video enabled telemedicine application and verified that I am speaking with the correct person using two identifiers.  Location: Patient: home address in [place] Provider: remote office in [place]   I discussed the limitations of evaluation and management by telemedicine and the availability of in person appointments. The patient expressed understanding and agreed to proceed.  I discussed the assessment and treatment plan with the patient. The patient was provided an opportunity to ask questions and all were answered. The patient agreed with the plan and demonstrated an understanding of the instructions.   The patient was advised to call back or seek an in-person evaluation if the symptoms worsen or if the condition fails to improve as anticipated.  I provided 35 minutes dedicated to the care of this patient via  video on  the date of this encounter to include chart review, face-to-face time with the patient, medication management/counseling, and brief therapeutic support.  Sarina Robleto A Orlandus Borowski 09/18/2022, 3:09 PM

## 2022-09-18 ENCOUNTER — Encounter (HOSPITAL_COMMUNITY): Payer: Self-pay | Admitting: Psychiatry

## 2022-09-18 ENCOUNTER — Telehealth (INDEPENDENT_AMBULATORY_CARE_PROVIDER_SITE_OTHER): Payer: Medicaid Other | Admitting: Psychiatry

## 2022-09-18 DIAGNOSIS — F410 Panic disorder [episodic paroxysmal anxiety] without agoraphobia: Secondary | ICD-10-CM | POA: Diagnosis not present

## 2022-09-18 DIAGNOSIS — F411 Generalized anxiety disorder: Secondary | ICD-10-CM

## 2022-09-18 MED ORDER — CLONAZEPAM 0.5 MG PO TABS
0.5000 mg | ORAL_TABLET | Freq: Three times a day (TID) | ORAL | 2 refills | Status: DC
Start: 2022-09-29 — End: 2022-11-04

## 2022-09-18 MED ORDER — MIRTAZAPINE 30 MG PO TABS
30.0000 mg | ORAL_TABLET | Freq: Every evening | ORAL | 2 refills | Status: DC
Start: 2022-09-18 — End: 2022-11-04

## 2022-09-18 NOTE — Patient Instructions (Signed)

## 2022-10-04 DIAGNOSIS — R7989 Other specified abnormal findings of blood chemistry: Secondary | ICD-10-CM | POA: Diagnosis not present

## 2022-10-04 DIAGNOSIS — E7800 Pure hypercholesterolemia, unspecified: Secondary | ICD-10-CM | POA: Diagnosis not present

## 2022-10-08 ENCOUNTER — Encounter: Payer: Self-pay | Admitting: Gastroenterology

## 2022-10-08 ENCOUNTER — Ambulatory Visit: Payer: Medicaid Other | Admitting: Cardiology

## 2022-10-08 ENCOUNTER — Other Ambulatory Visit: Payer: Medicaid Other

## 2022-10-08 ENCOUNTER — Ambulatory Visit: Payer: Medicaid Other | Admitting: Gastroenterology

## 2022-10-08 ENCOUNTER — Encounter: Payer: Self-pay | Admitting: Cardiology

## 2022-10-08 VITALS — BP 134/73 | HR 69 | Resp 16 | Ht 60.0 in | Wt 160.0 lb

## 2022-10-08 VITALS — BP 130/74 | HR 69 | Ht 60.0 in | Wt 160.5 lb

## 2022-10-08 DIAGNOSIS — Z800 Family history of malignant neoplasm of digestive organs: Secondary | ICD-10-CM

## 2022-10-08 DIAGNOSIS — R195 Other fecal abnormalities: Secondary | ICD-10-CM | POA: Diagnosis not present

## 2022-10-08 DIAGNOSIS — K449 Diaphragmatic hernia without obstruction or gangrene: Secondary | ICD-10-CM

## 2022-10-08 DIAGNOSIS — K7689 Other specified diseases of liver: Secondary | ICD-10-CM

## 2022-10-08 DIAGNOSIS — R1013 Epigastric pain: Secondary | ICD-10-CM | POA: Diagnosis not present

## 2022-10-08 DIAGNOSIS — R7989 Other specified abnormal findings of blood chemistry: Secondary | ICD-10-CM

## 2022-10-08 DIAGNOSIS — H0266 Xanthelasma of left eye, unspecified eyelid: Secondary | ICD-10-CM | POA: Diagnosis not present

## 2022-10-08 DIAGNOSIS — I10 Essential (primary) hypertension: Secondary | ICD-10-CM

## 2022-10-08 DIAGNOSIS — E7800 Pure hypercholesterolemia, unspecified: Secondary | ICD-10-CM | POA: Diagnosis not present

## 2022-10-08 DIAGNOSIS — H0263 Xanthelasma of right eye, unspecified eyelid: Secondary | ICD-10-CM | POA: Diagnosis not present

## 2022-10-08 DIAGNOSIS — K219 Gastro-esophageal reflux disease without esophagitis: Secondary | ICD-10-CM | POA: Diagnosis not present

## 2022-10-08 DIAGNOSIS — K580 Irritable bowel syndrome with diarrhea: Secondary | ICD-10-CM

## 2022-10-08 DIAGNOSIS — Z01810 Encounter for preprocedural cardiovascular examination: Secondary | ICD-10-CM

## 2022-10-08 MED ORDER — ATORVASTATIN CALCIUM 40 MG PO TABS
40.0000 mg | ORAL_TABLET | Freq: Every day | ORAL | 1 refills | Status: DC
Start: 2022-10-08 — End: 2023-09-11

## 2022-10-08 MED ORDER — HYDRALAZINE HCL 25 MG PO TABS: 25.0000 mg | ORAL_TABLET | Freq: Three times a day (TID) | ORAL | 3 refills | Status: DC

## 2022-10-08 MED ORDER — HYDROCHLOROTHIAZIDE 12.5 MG PO CAPS
12.5000 mg | ORAL_CAPSULE | Freq: Every day | ORAL | 1 refills | Status: DC
Start: 2022-10-08 — End: 2023-04-01

## 2022-10-08 MED ORDER — VIBERZI 100 MG PO TABS: 100.0000 mg | ORAL_TABLET | Freq: Two times a day (BID) | ORAL | 3 refills | Status: AC

## 2022-10-08 MED ORDER — LOSARTAN POTASSIUM 50 MG PO TABS
50.0000 mg | ORAL_TABLET | Freq: Every day | ORAL | 1 refills | Status: DC
Start: 2022-10-08 — End: 2023-04-01

## 2022-10-08 MED ORDER — LABETALOL HCL 100 MG PO TABS
100.0000 mg | ORAL_TABLET | Freq: Two times a day (BID) | ORAL | 0 refills | Status: DC
Start: 2022-10-08 — End: 2023-04-01

## 2022-10-08 MED ORDER — NEXLIZET 180-10 MG PO TABS
1.0000 | ORAL_TABLET | Freq: Every day | ORAL | 1 refills | Status: AC
Start: 2022-10-08 — End: 2023-04-06

## 2022-10-08 MED ORDER — NA SULFATE-K SULFATE-MG SULF 17.5-3.13-1.6 GM/177ML PO SOLN: 1.0000 | Freq: Once | ORAL | 0 refills | Status: AC

## 2022-10-08 NOTE — Progress Notes (Signed)
Date:  10/08/2022   ID:  Erin Pollard, DOB [birth year], MRN [id number]  PCP:  Grayce Sessions, NP  Cardiologist:  Tessa Lerner, DO, [facility] (established care February 01, 2021) Former Cardiology Providers: Dr. Aniceto Boss [facility], [place])  Date: 10/08/22 Last Office Visit: 07/05/2022  Chief Complaint  Patient presents with   Pure hypercholesterolemia   Elevated ALT and AST   Follow-up    HPI  Erin Pollard is a 52 y.o. African-American female whose past medical history and cardiovascular risk factors include: Panic attack, anxiety, hyperlipidemia, history of myocardial infarction (04/2015) no intervention, family history of premature CAD, benign essential hypertension, GERD, former smoker, obesity due to excess calories.   Patient was referred to the practice for evaluation of chest pain.  He has undergone appropriate workup as outlined below.  On physical examination she was noted to have xanthelasmas and her total cholesterol used to be 223 and LDL 158 mg/dL.  Once she was titrated on statin therapy and Nexlizet her lipids have improved.  She has had a history of elevated AST and ALT but as of September 2024 her LFTs are back to normal limits.  She presents today for follow-up.  She denies anginal chest pain or heart failure symptoms.  Overall functional capacity remains stable.  She has followed up with gastroenterology and plans to undergo EGD and colonoscopy in December 2024.    She is working on lifestyle modifications and has lost approximately 4 pounds since last office visit.  She is requesting refills on medications.  Family history of premature CAD with mother having a myocardial infarction at the age of 31 and passed.  FUNCTIONAL STATUS: No structured exercise program or daily routine.   ALLERGIES: Allergies  Allergen Reactions   Amlodipine Hives    Achy legs and s.o.b Achy legs and s.o.b    Hydrocodone-Acetaminophen Hives and Rash   Iodinated Contrast  Media Hives   Iodine Hives and Rash   Codeine    Hydrocortisone-Iodoquinol    Propoxyphene     MEDICATION LIST PRIOR TO VISIT: Current Meds  Medication Sig   cholecalciferol (VITAMIN D3) 25 MCG (1000 UNIT) tablet Take 1,000 Units by mouth daily.   clonazePAM (KLONOPIN) 0.5 MG tablet Take 1 tablet (0.5 mg total) by mouth 3 (three) times daily.   Eluxadoline (VIBERZI) 100 MG TABS Take 1 tablet (100 mg total) by mouth 2 (two) times daily before a meal.   famotidine (PEPCID) 20 MG tablet Take 20 mg by mouth as needed.   ferrous sulfate 325 (65 FE) MG EC tablet TAKE 1 TABLET BY MOUTH EVERY DAY   KLOR-CON M10 10 MEQ tablet Take 10 mEq by mouth daily.   loratadine (CLARITIN) 10 MG tablet Take 1 tablet (10 mg total) by mouth daily.   methocarbamol (ROBAXIN) 500 MG tablet Take 1 tablet (500 mg total) by mouth 2 (two) times daily.   mirtazapine (REMERON) 30 MG tablet Take 1 tablet (30 mg total) by mouth at bedtime.   Na Sulfate-K Sulfate-Mg Sulf 17.5-3.13-1.6 GM/177ML SOLN Take 1 kit by mouth once for 1 dose.   naproxen (NAPROSYN) 500 MG tablet Take 1 tablet (500 mg total) by mouth 2 (two) times daily.   Omega-3 Fatty Acids (FISH OIL) 1000 MG CAPS Take 1 capsule by mouth daily.   ondansetron (ZOFRAN-ODT) 4 MG disintegrating tablet Take 1 tablet (4 mg total) by mouth every 8 (eight) hours as needed for nausea or vomiting.   oxymetazoline (AFRIN NASAL SPRAY)  0.05 % nasal spray Place 1 spray into both nostrils 2 (two) times daily.   sucralfate (CARAFATE) 1 g tablet Take 1 tablet (1 g total) by mouth 4 (four) times daily -  with meals and at bedtime.   [DISCONTINUED] atorvastatin (LIPITOR) 40 MG tablet Take 1 tablet (40 mg total) by mouth at bedtime.   [DISCONTINUED] Bempedoic Acid-Ezetimibe (NEXLIZET) 180-10 MG TABS Take 1 tablet by mouth daily.   [DISCONTINUED] hydrALAZINE (APRESOLINE) 25 MG tablet Take 1 tablet (25 mg total) by mouth 3 (three) times daily.   [DISCONTINUED] hydrochlorothiazide  (MICROZIDE) 12.5 MG capsule Take 1 capsule (12.5 mg total) by mouth daily.   [DISCONTINUED] labetalol (NORMODYNE) 200 MG tablet TAKE 1 TABLET BY MOUTH EVERY DAY   [DISCONTINUED] losartan (COZAAR) 50 MG tablet TAKE 1 TABLET BY MOUTH EVERY DAY     PAST MEDICAL HISTORY: Past Medical History:  Diagnosis Date   Anxiety    Cervical radiculopathy    GERD (gastroesophageal reflux disease)    Hiatal hernia 08/28/2022   [facility] was found through a CT scan and fibroids and cyst on liver parenchyma   Hypertension    Myocardial infarction (HCC)    Panic attack     PAST SURGICAL HISTORY: Past Surgical History:  Procedure Laterality Date   LEFT HEART CATH     TUBAL LIGATION      FAMILY HISTORY: The patient family history includes Cancer in her father; Heart attack in her maternal grandmother and mother; Heart disease in her maternal grandfather and maternal grandmother; Stroke in her father, maternal grandfather, and sister.  SOCIAL HISTORY:  The patient  reports that she quit smoking about 7 years ago. Her smoking use included cigarettes. She does not have any smokeless tobacco history on file. She reports that she does not currently use alcohol. She reports that she does not use drugs.  REVIEW OF SYSTEMS: Review of Systems  Cardiovascular:  Negative for chest pain, claudication, dyspnea on exertion, irregular heartbeat, leg swelling, near-syncope, orthopnea, palpitations, paroxysmal nocturnal dyspnea and syncope.  Respiratory:  Negative for shortness of breath.   Hematologic/Lymphatic: Negative for bleeding problem.  Musculoskeletal:  Negative for muscle cramps and myalgias.  Neurological:  Negative for dizziness and light-headedness.    PHYSICAL EXAM:    10/08/2022    3:58 PM 10/08/2022   10:33 AM 08/06/2022   12:43 AM  Vitals with BMI  Height 5\' 0"  5\' 0"    Weight 160 lbs 160 lbs 8 oz   BMI 31.25 31.35   Systolic 134 130 696  Diastolic 73 74 83  Pulse 69 69  81   Physical Exam Eyes:     Comments: Bilateral upper lid xanthelasmas are improving  Cardiovascular:     Rate and Rhythm: Normal rate and regular rhythm.     Pulses: Normal pulses.          Carotid pulses are 2+ on the right side and 2+ on the left side.      Radial pulses are 2+ on the right side and 2+ on the left side.       Dorsalis pedis pulses are 2+ on the right side and 2+ on the left side.     Heart sounds: Normal heart sounds. No murmur heard.    No gallop.  Pulmonary:     Effort: Pulmonary effort is normal. No respiratory distress.     Breath sounds: Normal breath sounds. No wheezing.  Musculoskeletal:     Right lower leg:  No edema.     Left lower leg: No edema.    RADIOLOGY DATABASE: Chest x-ray 11/13/2020: No active cardiopulmonary disease.  CARDIAC DATABASE: EKG  10/08/2022: Sinus rhythm, 63 bpm, without underlying ischemia injury pattern  Echocardiogram: 04/2016 [facility] available in Care Everywhere: LVEF 60 to 65%, normal diastolic filling pattern, normal right ventricular size and function, normal left atrial size, no significant valvular heart disease.  02/23/2021: Normal LV systolic function with visual EF 60-65%. Left ventricle cavity is normal in size. Normal left ventricular wall thickness. Normal global wall motion. Normal diastolic filling pattern, normal LAP. No significant valvular heart disease. Anechoic structure within the liver parenchyma measuring 3.34 x 4.05cm likely a cyst, consider dedicated ultrasound of abdomen if clinically indicated.  Compared to outside study 04/2016 no significant change.   05/10/2022:  Normal LV systolic function with visual EF 60-65%. Left ventricle cavity is normal in size. Normal left ventricular wall thickness. Normal global wall motion. Normal diastolic filling pattern, normal LAP. Calculated EF 66%.  Structurally normal tricuspid valve with no regurgitation. No evidence of pulmonary  hypertension.  No significant change compared to 02/2021.   Stress Testing: Exercise treadmill stress test 02/23/2021: Functional status: Fair. Chest pain: No. Reason for stopping exercise: Dyspnea, patient request. Hypertensive response to exercise: No. Exercise time 5 minutes 59 seconds on Bruce protocol, achieved 7.05 METS, 88% of age-predicted maximum heart rate (APMHR).  Stress ECG positive for ischemia.  Intermediate risk study. Consider further cardiac work up if clinically indicated. Clinical correlation required.   Exercise nuclear stress test 04/02/2021: Normal ECG stress. The patient exercised for 5 minutes and 0 seconds of a Bruce protocol, achieving approximately 7.05 METs. Exercise capacity reduced. The blood pressure response was normal. Myocardial perfusion is normal. Overall LV systolic function is normal without regional wall motion abnormalities. Stress LV EF: 69%.  No previous exam available for comparison. Low risk.  Heart Catheterization: Last left heart catheterization April 2017 per patient.  We will request records.  LABORATORY DATA:    Latest Ref Rng & Units 05/06/2022    7:20 PM 03/27/2022    6:10 PM 12/11/2021    3:49 PM  CBC  WBC 4.0 - 10.5 K/uL 5.1  6.2  5.8   Hemoglobin 12.0 - 15.0 g/dL 44.0  10.2  72.5   Hematocrit 36.0 - 46.0 % 34.6  36.7  36.7   Platelets 150 - 400 K/uL 340  328  310        Latest Ref Rng & Units 10/04/2022    8:40 AM 07/04/2022    8:01 AM 05/06/2022    7:20 PM  CMP  Glucose 70 - 99 mg/dL 366  440  96   BUN 6 - 24 mg/dL 9  10  10    Creatinine 0.57 - 1.00 mg/dL 3.47  4.25  9.56   Sodium 134 - 144 mmol/L 139  143  136   Potassium 3.5 - 5.2 mmol/L 3.8  4.0  3.1   Chloride 96 - 106 mmol/L 101  102  103   CO2 20 - 29 mmol/L 25  26  25    Calcium 8.7 - 10.2 mg/dL 9.6  9.9  8.7   Total Protein 6.0 - 8.5 g/dL 6.7  7.0  7.4   Total Bilirubin 0.0 - 1.2 mg/dL 0.3  0.3  0.4   Alkaline Phos 44 - 121 IU/L 107  88  52   AST 0 - 40 IU/L  21  75  24   ALT 0 - 32 IU/L 27  101  25     Lipid Panel  Lab Results  Component Value Date   CHOL 139 07/04/2022   HDL 37 (L) 07/04/2022   LDLCALC 89 07/04/2022   LDLDIRECT 84 07/04/2022   TRIG 62 07/04/2022   CHOLHDL 5.0 09/14/2021    BMP Recent Labs    03/27/22 1810 05/06/22 1920 07/04/22 0801 10/04/22 0840  NA 137 136 143 139  K 3.6 3.1* 4.0 3.8  CL 104 103 102 101  CO2 25 25 26 25   GLUCOSE 108* 96 111* 100*  BUN 9 10 10 9   CREATININE 0.60 0.72 0.76 0.73  CALCIUM 9.0 8.7* 9.9 9.6  GFRNONAA >60 >60  --   --     HEMOGLOBIN A1C Lab Results  Component Value Date   HGBA1C 5.6 12/11/2021    IMPRESSION:    ICD-10-CM   1. Pure hypercholesterolemia  E78.00 Bempedoic Acid-Ezetimibe (NEXLIZET) 180-10 MG TABS    atorvastatin (LIPITOR) 40 MG tablet    2. Xanthelasma of eyelid, bilateral  H02.63 Bempedoic Acid-Ezetimibe (NEXLIZET) 180-10 MG TABS   H02.66 atorvastatin (LIPITOR) 40 MG tablet    3. Preprocedural cardiovascular examination  Z01.810 EKG 12-Lead    4. Benign hypertension  I10 losartan (COZAAR) 50 MG tablet    hydrochlorothiazide (MICROZIDE) 12.5 MG capsule    5. Abnormal LFTs  R79.89     6. Essential hypertension  I10 labetalol (NORMODYNE) 100 MG tablet    hydrALAZINE (APRESOLINE) 25 MG tablet    7. Essential (primary) hypertension  I10         RECOMMENDATIONS: JENAIYA LIPS is a 52 y.o. African-American female whose past medical history and cardiac risk factors include: Panic attack, anxiety, history of myocardial infarction (04/2015) no intervention, family history of premature CAD, benign essential hypertension, GERD, former smoker, obesity due to excess calories.   Pure hypercholesterolemia Xanthelasma of eyelid, bilateral Initial LDL 158 mg/dL and xanthelasmas noted bilaterally with family history of premature CAD as noted above. Currently on atorvastatin and Nexlizet. Most recent LDL 84 mg/dL. Repeat labs from September 2024 notes AST and  ALT within normal limits. Medications refilled.   Of note, in the past she wanted to avoid PCSK9 inhibitors i.e. injectable medications.    Preprocedural examination: Patient is being scheduled for EGD and colonoscopy in December 2024. Denies anginal chest pain or heart failure symptoms. Has undergone appropriate cardiovascular testing. EKG today is nonischemic. As long as clinically she remains stable from now till December she is overall acceptable risk for upcoming procedure.  If there is change in clinical status she will need to be reevaluated.  Benign hypertension Office blood pressures within acceptable limits. Medications reconciled and refilled. Would like to consolidate some of her hypertensive medications at the next office visit.   FINAL MEDICATION LIST END OF ENCOUNTER: Meds ordered this encounter  Medications   losartan (COZAAR) 50 MG tablet    Sig: Take 1 tablet (50 mg total) by mouth daily.    Dispense:  90 tablet    Refill:  1   labetalol (NORMODYNE) 100 MG tablet    Sig: Take 1 tablet (100 mg total) by mouth 2 (two) times daily.    Dispense:  180 tablet    Refill:  0   hydrochlorothiazide (MICROZIDE) 12.5 MG capsule    Sig: Take 1 capsule (12.5 mg total) by mouth daily.    Dispense:  90 capsule    Refill:  1  hydrALAZINE (APRESOLINE) 25 MG tablet    Sig: Take 1 tablet (25 mg total) by mouth 3 (three) times daily.    Dispense:  270 tablet    Refill:  3   Bempedoic Acid-Ezetimibe (NEXLIZET) 180-10 MG TABS    Sig: Take 1 tablet by mouth daily.    Dispense:  90 tablet    Refill:  1    Approved 04/15/22-10/16/2022   atorvastatin (LIPITOR) 40 MG tablet    Sig: Take 1 tablet (40 mg total) by mouth at bedtime.    Dispense:  90 tablet    Refill:  1    Medications Discontinued During This Encounter  Medication Reason   atorvastatin (LIPITOR) 40 MG tablet Reorder   hydrALAZINE (APRESOLINE) 25 MG tablet Reorder   losartan (COZAAR) 50 MG tablet Reorder    hydrochlorothiazide (MICROZIDE) 12.5 MG capsule Reorder   Bempedoic Acid-Ezetimibe (NEXLIZET) 180-10 MG TABS Reorder   labetalol (NORMODYNE) 200 MG tablet Reorder       Current Outpatient Medications:    cholecalciferol (VITAMIN D3) 25 MCG (1000 UNIT) tablet, Take 1,000 Units by mouth daily., Disp: , Rfl:    clonazePAM (KLONOPIN) 0.5 MG tablet, Take 1 tablet (0.5 mg total) by mouth 3 (three) times daily., Disp: 90 tablet, Rfl: 2   Eluxadoline (VIBERZI) 100 MG TABS, Take 1 tablet (100 mg total) by mouth 2 (two) times daily before a meal., Disp: 60 tablet, Rfl: 3   famotidine (PEPCID) 20 MG tablet, Take 20 mg by mouth as needed., Disp: , Rfl:    ferrous sulfate 325 (65 FE) MG EC tablet, TAKE 1 TABLET BY MOUTH EVERY DAY, Disp: 90 tablet, Rfl: 0   KLOR-CON M10 10 MEQ tablet, Take 10 mEq by mouth daily., Disp: , Rfl:    loratadine (CLARITIN) 10 MG tablet, Take 1 tablet (10 mg total) by mouth daily., Disp: 30 tablet, Rfl: 6   methocarbamol (ROBAXIN) 500 MG tablet, Take 1 tablet (500 mg total) by mouth 2 (two) times daily., Disp: 20 tablet, Rfl: 0   mirtazapine (REMERON) 30 MG tablet, Take 1 tablet (30 mg total) by mouth at bedtime., Disp: 30 tablet, Rfl: 2   Na Sulfate-K Sulfate-Mg Sulf 17.5-3.13-1.6 GM/177ML SOLN, Take 1 kit by mouth once for 1 dose., Disp: 354 mL, Rfl: 0   naproxen (NAPROSYN) 500 MG tablet, Take 1 tablet (500 mg total) by mouth 2 (two) times daily., Disp: 90 tablet, Rfl: 1   Omega-3 Fatty Acids (FISH OIL) 1000 MG CAPS, Take 1 capsule by mouth daily., Disp: , Rfl:    ondansetron (ZOFRAN-ODT) 4 MG disintegrating tablet, Take 1 tablet (4 mg total) by mouth every 8 (eight) hours as needed for nausea or vomiting., Disp: 20 tablet, Rfl: 0   oxymetazoline (AFRIN NASAL SPRAY) 0.05 % nasal spray, Place 1 spray into both nostrils 2 (two) times daily., Disp: 15 mL, Rfl: 0   sucralfate (CARAFATE) 1 g tablet, Take 1 tablet (1 g total) by mouth 4 (four) times daily -  with meals and at bedtime.,  Disp: 120 tablet, Rfl: 0   atorvastatin (LIPITOR) 40 MG tablet, Take 1 tablet (40 mg total) by mouth at bedtime., Disp: 90 tablet, Rfl: 1   Bempedoic Acid-Ezetimibe (NEXLIZET) 180-10 MG TABS, Take 1 tablet by mouth daily., Disp: 90 tablet, Rfl: 1   hydrALAZINE (APRESOLINE) 25 MG tablet, Take 1 tablet (25 mg total) by mouth 3 (three) times daily., Disp: 270 tablet, Rfl: 3   hydrochlorothiazide (MICROZIDE) 12.5 MG capsule, Take 1 capsule (  12.5 mg total) by mouth daily., Disp: 90 capsule, Rfl: 1   labetalol (NORMODYNE) 100 MG tablet, Take 1 tablet (100 mg total) by mouth 2 (two) times daily., Disp: 180 tablet, Rfl: 0   losartan (COZAAR) 50 MG tablet, Take 1 tablet (50 mg total) by mouth daily., Disp: 90 tablet, Rfl: 1  Orders Placed This Encounter  Procedures   EKG 12-Lead    There are no Patient Instructions on file for this visit.   --Continue cardiac medications as reconciled in final medication list. --Return in about 6 months (around 04/07/2023) for Follow up, Lipid. Or sooner if needed. --Continue follow-up with your primary care physician regarding the management of your other chronic comorbid conditions.  Patient's questions and concerns were addressed to her satisfaction. She voices understanding of the instructions provided during this encounter.   This note was created using a voice recognition software as a result there may be grammatical errors inadvertently enclosed that do not reflect the nature of this encounter. Every attempt is made to correct such errors.  Tessa Lerner, [place], [facility]  Pager:  [phone] Office: [phone]

## 2022-10-08 NOTE — Progress Notes (Signed)
Chief Complaint:    Epigastric pain, hiatal hernia  GI History: 52 year old female follows in the GI clinic for the following:  1) IBS-D.  Longstanding history of IBS-D. Worse with gravy, salad, leafy greens.  Tends to take probiotics daily, but interestingly if she has flares symptoms will tend to resolve after taking dose of probiotic.  No hematochezia.  Rarely uses Lomotil on demand.  2) GERD.  Longstanding history of reflux. Symptoms well controlled on Prilosec 40 mg daily. Rarely uses Pepcid 20 mg prn breakthrough. Previously used Nexium which eventually lost efficacy. Reports a history of H. pylori in the past  3) Family history of colon cancer.  Father with CRC.  Several family members with Crohn's Disease.  Previously followed at [facility] for her chronic GI issues prior to establishing with [facility] in 10/2021.  - 04/16/2019: EGD: Gastritis (path negative for H. pylori) - 03/19/2019: Colonoscopy: Diverticulosis.  Repeat in 5 years due to family history - 05/31/2019: CT A/P: Hepatic cysts with largest measuring 4.6 cm in the left hepatic lobe.  Otherwise normal liver.  Diverticulosis. - 04/13/2020: CT A/P: Diverticulosis without diverticulitis, stable 4.8 x 3.4 x 3.1 cm hepatic cyst with otherwise normal.  Liver, large uterus likely containing fibroid  HPI:     Patient is a 52 y.o. female presenting to the Gastroenterology Clinic for follow-up.  Was initially seen by me to establish care on 11/14/2021.  Reflux well-controlled at that time with Prilosec 40 mg daily and Pepcid prn breakthrough.  For her IBS-D and increased flatus, trialed low FODMAP diet, OTC simethicone.  Was seen in the ER at [facility] in [place], [place] on 09/02/2022 for epigastric pain/burning, nausea, back pain x 1 week.  Normal troponin x 2, UA, CMP, lipase.  H/H 11.9/38 with MCV/RDW 83/14 (all essentially near baseline).  Normal WBC, PLT.   CT abdomen/pelvis was notable for probable 5.6 cm uterine fibroid  (recommended outpatient pelvic ultrasound), scattered subcentimeter low-density lesions of the liver parenchyma, and small hiatal hernia. HD stable, exam unremarkable. Was treated with lidocaine patch, GI cocktail, Zofran with improvement and discharged home.  She is still having episodic epigastric burning pain. Will also have episodic fecal urgency; worse with ketchup, hot sauce, salads (has avoided leafy greens for 20+ years), gravy. Worse with stress. Can have sxs in the middle of the night with urgency. Improves with imodium.   Tried Bean-O (no change), no dairy, and increased dietary fiber.   Reviewed most recent labs from 10/04/2022: - Normal CMP  Review of systems:     No chest pain, no SOB, no fevers, no urinary sx   Past Medical History:  Diagnosis Date   Anxiety    Cervical radiculopathy    GERD (gastroesophageal reflux disease)    Hiatal hernia 08/28/2022   [facility] was found through a CT scan and fibroids and cyst on liver parenchyma   Hypertension    Myocardial infarction [facility])    Panic attack     Patient's surgical history, family medical history, social history, medications and allergies were all reviewed in Epic    Current Outpatient Medications  Medication Sig Dispense Refill   atorvastatin (LIPITOR) 40 MG tablet Take 1 tablet (40 mg total) by mouth at bedtime. 90 tablet 3   Bempedoic Acid-Ezetimibe (NEXLIZET) 180-10 MG TABS Take 1 tablet by mouth daily. 90 tablet 1   cholecalciferol (VITAMIN D3) 25 MCG (1000 UNIT) tablet Take 1,000 Units by mouth daily.  clonazePAM (KLONOPIN) 0.5 MG tablet Take 1 tablet (0.5 mg total) by mouth 3 (three) times daily. 90 tablet 2   famotidine (PEPCID) 20 MG tablet Take 20 mg by mouth as needed.     ferrous sulfate 325 (65 FE) MG EC tablet TAKE 1 TABLET BY MOUTH EVERY DAY 90 tablet 0   hydrALAZINE (APRESOLINE) 25 MG tablet Take 1 tablet (25 mg total) by mouth 3 (three) times daily. 270 tablet 3    hydrochlorothiazide (MICROZIDE) 12.5 MG capsule Take 1 capsule (12.5 mg total) by mouth daily. 90 capsule 1   KLOR-CON M10 10 MEQ tablet Take 10 mEq by mouth daily.     labetalol (NORMODYNE) 200 MG tablet TAKE 1 TABLET BY MOUTH EVERY DAY 30 tablet 5   loratadine (CLARITIN) 10 MG tablet Take 1 tablet (10 mg total) by mouth daily. 30 tablet 6   losartan (COZAAR) 50 MG tablet TAKE 1 TABLET BY MOUTH EVERY DAY 90 tablet 3   methocarbamol (ROBAXIN) 500 MG tablet Take 1 tablet (500 mg total) by mouth 2 (two) times daily. 20 tablet 0   mirtazapine (REMERON) 30 MG tablet Take 1 tablet (30 mg total) by mouth at bedtime. 30 tablet 2   naproxen (NAPROSYN) 500 MG tablet Take 1 tablet (500 mg total) by mouth 2 (two) times daily. 90 tablet 1   Omega-3 Fatty Acids (FISH OIL) 1000 MG CAPS Take 1 capsule by mouth daily.     ondansetron (ZOFRAN-ODT) 4 MG disintegrating tablet Take 1 tablet (4 mg total) by mouth every 8 (eight) hours as needed for nausea or vomiting. 20 tablet 0   oxymetazoline (AFRIN NASAL SPRAY) 0.05 % nasal spray Place 1 spray into both nostrils 2 (two) times daily. 15 mL 0   sucralfate (CARAFATE) 1 g tablet Take 1 tablet (1 g total) by mouth 4 (four) times daily -  with meals and at bedtime. 120 tablet 0   No current facility-administered medications for this visit.    Physical Exam:     BP 130/74   Pulse 69   Ht 5' (1.524 m)   Wt 160 lb 8 oz (72.8 kg)   SpO2 97%   BMI 31.35 kg/m   GENERAL:  Pleasant female in NAD PSYCH: : Cooperative, normal affect ABDOMEN:  Nondistended, soft NEURO: Alert and oriented x 3, no focal neurologic deficits   IMPRESSION and PLAN:    1) GERD 2) Hiatal hernia Longstanding history of reflux, generally well-controlled with Prilosec.  Recent CT with hiatal hernia.  Reviewed comparison CT and previous endoscopy report, both without HH.  Discussed pathophysiology of reflux with particular relation to presence of hiatal hernia with plan as follows:  - EGD  to evaluate size/severity of hiatal hernia and evaluate for erosive esophagitis - Continue Prilosec 40 mg daily - Continue Pepcid prn breakthrough symptoms - Continue antireflux lifestyle/dietary modifications  3) Epigastric pain - EGD to evaluate for mucosal/luminal pathology - Recent CT otherwise without any inflammatory changes  4) IBS-D 5) Increased flatus/gas 6) Fecal urgency - Colonoscopy with random and directed biopsies - Check fecal calprotectin, fecal pancreatic elastase - Will trial course of Viberzi 100 mg bid - Hold Imodium when starting Viberzi - Start fiber supplement such as Benefiber or Citrucel - Can trial IBgard while awaiting approval of Viberzi - Trial low FODMAP diet.  Provided with handout and detailed instructions today  7) Uterine fibroids - Already following with GYN for uterine fibroids seen on CT  8) Hepatic cyst Hepatic cyst  stable on serial imaging.  Otherwise normal-appearing liver with normal liver enzymes - No repeat imaging needed for this specific finding  9) Family history of colon cancer (father) - Planning repeat colonoscopy now as above.  Will perform CRC screening at that time  The indications, risks, and benefits of EGD and colonoscopy were explained to the patient in detail. Risks include but are not limited to bleeding, perforation, adverse reaction to medications, and cardiopulmonary compromise. Sequelae include but are not limited to the possibility of surgery, hospitalization, and mortality. The patient verbalized understanding and wished to proceed. All questions answered, referred to scheduler and bowel prep ordered. Further recommendations pending results of the exam.            Shellia Cleverly ,DO, FACG 10/08/2022, 10:49 AM

## 2022-10-08 NOTE — Patient Instructions (Addendum)
_______________________________________________________  If your blood pressure at your visit was 140/90 or greater, please contact your primary care physician to follow up on this.  _______________________________________________________  If you are age 54 or older, your body mass index should be between 23-30. Your Body mass index is 31.35 kg/m. If this is out of the aforementioned range listed, please consider follow up with your Primary Care Provider.  If you are age 38 or younger, your body mass index should be between 19-25. Your Body mass index is 31.35 kg/m. If this is out of the aformentioned range listed, please consider follow up with your Primary Care Provider.   ________________________________________________________  The [facility] providers would like to encourage you to use [facility] to communicate with providers for non-urgent requests or questions.  Due to long hold times on the telephone, sending your provider a message by [facility] may be a faster and more efficient way to get a response.  Please allow 48 business hours for a response.  Please remember that this is for non-urgent requests.  _______________________________________________________  Your provider has requested that you go to the basement level for lab work before leaving today. Press "B" on the elevator. The lab is located at the first door on the left as you exit the elevator.  Please purchase the following medications over the counter and take as directed: Fiber supplement  We have sent the following medications to your pharmacy for you to pick up at your convenience: Suprep Viberzi- a script has been sent and printed for you   Stop iron a week prior to the procedure date 12-16-2022  You have been scheduled for an endoscopy and colonoscopy. Please follow the written instructions given to you at your visit today.  Please pick up your prep supplies at the pharmacy within the next 1-3 days.  If you use  inhalers (even only as needed), please bring them with you on the day of your procedure.  DO NOT TAKE 7 DAYS PRIOR TO TEST- Trulicity (dulaglutide) Ozempic, Wegovy (semaglutide) Mounjaro (tirzepatide) Bydureon Bcise (exanatide extended release)  DO NOT TAKE 1 DAY PRIOR TO YOUR TEST Rybelsus (semaglutide) Adlyxin (lixisenatide) Victoza (liraglutide) Byetta (exanatide) ___________________________________________________________________________  Low FODMAP Diet: (Fermentable Oligosaccharides, Disaccharides, Monosaccharides, and Polyols) These are short chain carbohydrates and sugar alcohols that are poorly absorbed by the body, resulting in multiple abdominal symptoms, including changes in bowel habits, abdominal pain/discomfort, bloating, abdominal distension, gas, etc.     It was a pleasure to see you today!  Vito Cirigliano, D.O.

## 2022-10-09 ENCOUNTER — Other Ambulatory Visit: Payer: Medicaid Other

## 2022-10-09 DIAGNOSIS — K449 Diaphragmatic hernia without obstruction or gangrene: Secondary | ICD-10-CM

## 2022-10-09 DIAGNOSIS — R195 Other fecal abnormalities: Secondary | ICD-10-CM

## 2022-10-09 DIAGNOSIS — K219 Gastro-esophageal reflux disease without esophagitis: Secondary | ICD-10-CM | POA: Diagnosis not present

## 2022-10-09 DIAGNOSIS — R1013 Epigastric pain: Secondary | ICD-10-CM

## 2022-10-09 DIAGNOSIS — K580 Irritable bowel syndrome with diarrhea: Secondary | ICD-10-CM

## 2022-10-10 DIAGNOSIS — D252 Subserosal leiomyoma of uterus: Secondary | ICD-10-CM | POA: Diagnosis not present

## 2022-10-10 DIAGNOSIS — R102 Pelvic and perineal pain: Secondary | ICD-10-CM | POA: Diagnosis not present

## 2022-10-12 LAB — CALPROTECTIN, FECAL: Calprotectin, Fecal: 73 ug/g (ref 0–120)

## 2022-10-14 ENCOUNTER — Telehealth: Payer: Self-pay | Admitting: Gastroenterology

## 2022-10-14 ENCOUNTER — Ambulatory Visit (HOSPITAL_COMMUNITY): Payer: Medicaid Other | Admitting: Mental Health

## 2022-10-14 DIAGNOSIS — F411 Generalized anxiety disorder: Secondary | ICD-10-CM | POA: Diagnosis not present

## 2022-10-14 DIAGNOSIS — K8689 Other specified diseases of pancreas: Secondary | ICD-10-CM

## 2022-10-14 DIAGNOSIS — F410 Panic disorder [episodic paroxysmal anxiety] without agoraphobia: Secondary | ICD-10-CM

## 2022-10-14 NOTE — Telephone Encounter (Signed)
Inbound call from patient requesting a call to discuss recent lab results. Please advise, thank you.
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
## 2022-10-14 NOTE — Telephone Encounter (Signed)
Attempted to reach patient. No answer, no voicemail. Will attempt to reach her again at a later time.

## 2022-10-15 NOTE — Telephone Encounter (Signed)
Patient advised of borderline fecal calprotectin (explained this test to patient), but unlikely to be the cause of symptoms. Advised that fecal pancreatic elastase has not yet returned, but when it does, we will be in touch with her about this as well. She verbalizes understanding.

## 2022-10-18 LAB — PANCREATIC ELASTASE, FECAL: Pancreatic Elastase-1, Stool: 144 ug/g — ABNORMAL LOW

## 2022-10-18 NOTE — Telephone Encounter (Signed)
Inbound call from patient in regards to previous note. Please advise.

## 2022-10-18 NOTE — Telephone Encounter (Signed)
Will reach back out to patient with pancreatic elastase results after review and recommendations by Dr Barron Alvine.

## 2022-10-18 NOTE — Telephone Encounter (Signed)
Inbound call from patient requesting to speak with nurse. States she has a question she would like to discuss before the weekend. Please advise, thank you

## 2022-10-18 NOTE — Telephone Encounter (Signed)
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 However, advised that we will order a new CT pancreas just out of abundance of caution. Also advised that we wold like her to start Creon pancreatic enzyme which should be helpful for her diarrhea. Patient has not yet picked up viberzi and will hold on that for the time being.  Dr C, what dosage of creon would you like patient to use 2 caps with meals and 1 with snacks?

## 2022-10-18 NOTE — Telephone Encounter (Signed)
From: Shellia Cleverly, DO Sent: 10/18/2022   4:55 PM EDT To: Richardson Chiquito, RN  Pancreatic elastase is 144.  This is in the intermediate level, and not always indicative of pancreatic insufficiency.  There are a number of reasons for this lab.  A somewhat reassuring piece information is her CT from 03/2020 which shows a normal-appearing pancreas.  Out of abundance of caution, can repeat CT pancreas protocol now.  Otherwise to start on Creon 2 caps with meals and 1 With snacks for diagnostic and therapeutic intent.  If starting the Creon, can probably hold off on Viberzi so that we do not have 2 different medications started at the same time and do not know which 1 is efficacious if symptoms resolved.  Alternatively, if she has already picked up the Viberzi, go ahead and start that instead.

## 2022-10-18 NOTE — Telephone Encounter (Signed)
Dr Barron Alvine-  Please see recent pancreatic elastase results and advise. Patient has reviewed in mychart and states that she has extreme anxiety; states she has googled pancreatic insufficiency and it mentions cancer so she is even more anxious to get recommendations. Provided reassurance and advised I will reach back out to her with recommendations.

## 2022-10-21 MED ORDER — PANCRELIPASE (LIP-PROT-AMYL) 36000-114000 UNITS PO CPEP: ORAL_CAPSULE | ORAL | 3 refills | Status: DC

## 2022-10-21 MED ORDER — PREDNISONE 50 MG PO TABS: ORAL_TABLET | ORAL | 0 refills | Status: DC

## 2022-10-21 MED ORDER — DIPHENHYDRAMINE HCL 25 MG PO TABS: 50.0000 mg | ORAL_TABLET | ORAL | 0 refills | Status: DC

## 2022-10-21 NOTE — Addendum Note (Signed)
Addended by: Richardson Chiquito on: 10/21/2022 10:48 AM   Modules accepted: Orders

## 2022-10-21 NOTE — Telephone Encounter (Signed)
Patient has been scheduled for CT abd/pancreatic protocol on Wednesday, 10/30/22 at 3:00 pm, 2:45 pm arrival; NPO 4 hours prior.  Patient does have IV contrast allergy (hives), therefore, per Dr Barron Alvine, will need prednisone/benadryl premed.

## 2022-10-21 NOTE — Telephone Encounter (Signed)
Patient has been advised of upcoming ct abd/pancreatic protocol with time/date/location and prep as well as premed information. She verbalizes understanding.

## 2022-10-21 NOTE — Telephone Encounter (Signed)
From: Shellia Cleverly, DO Sent: 10/21/2022   9:46 AM EDT To: Richardson Chiquito, RN  36,000 unit capsules. Thanks.

## 2022-10-25 NOTE — Telephone Encounter (Signed)
Patient says that pharmacy did not have viberzi because insurance would not cover it and that they never got a prescription for creon. I contacted [facility] who states that they did have script for creon and there was no issue with insurance coverage, they just didn't have the actual medication in stock while patient was at the pharmacy yesterday. They transferred that prescription over to [facility] at patient request. I also inquired about Viberzi. I was told Viberzi needed a prior authorization from insurance. I placed PA request thru [email or web address] and Viberzi has now been approved. I have left a voicemail advising patient of the information above and asked that she call me back with any further questions or concerns. -------- [name] (Key: [place]) Rx #: [id number] Viberzi 100MG  tablets Form [facility] [employer] [place] Form [phone] NCPDP) Created 17 days ago Sent to Plan Plan Response 3 minutes ago Submit Clinical Questions 3 minutes ago Determination Favorable Message from Plan PA Case: [id number], Status: Approved, Coverage Starts on: 10/25/2022 12:00:00 AM, Coverage Ends on: 10/25/2023 12:00:00 AM.. Authorization Expiration Date: October 25, 2023.

## 2022-10-25 NOTE — Telephone Encounter (Signed)
Inbound call from patient, states she picked up [facility] and is confused on which ones to take and which ones to hold. Would like to speak with a nurse to further advise.

## 2022-10-28 ENCOUNTER — Ambulatory Visit (INDEPENDENT_AMBULATORY_CARE_PROVIDER_SITE_OTHER): Payer: Medicaid Other | Admitting: Mental Health

## 2022-10-28 DIAGNOSIS — F410 Panic disorder [episodic paroxysmal anxiety] without agoraphobia: Secondary | ICD-10-CM

## 2022-10-28 DIAGNOSIS — F411 Generalized anxiety disorder: Secondary | ICD-10-CM
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
## 2022-10-29 NOTE — Telephone Encounter (Signed)
Patient called  requesting to speak with you regarding the Prednisone and Benadryl she picked up from the pharmacy. Has questions on how to take them needs directions.

## 2022-10-29 NOTE — Telephone Encounter (Signed)
Patient requesting to go over premedications for CT again. I have given verbal instructions for these medications as well as time/location/date/prep instructions for upcoming test. Patient verbalizes understanding.

## 2022-10-30 ENCOUNTER — Ambulatory Visit (HOSPITAL_COMMUNITY)
Admission: RE | Admit: 2022-10-30 | Discharge: 2022-10-30 | Disposition: A | Discharge status: Home / Self Care | Payer: Medicaid Other | Source: Ambulatory Visit | Attending: Gastroenterology | Admitting: Gastroenterology

## 2022-10-30 DIAGNOSIS — K8689 Other specified diseases of pancreas: Secondary | ICD-10-CM | POA: Diagnosis not present

## 2022-10-30 DIAGNOSIS — K449 Diaphragmatic hernia without obstruction or gangrene: Secondary | ICD-10-CM | POA: Diagnosis not present

## 2022-10-30 DIAGNOSIS — K7689 Other specified diseases of liver: Secondary | ICD-10-CM | POA: Diagnosis not present

## 2022-10-30 MED ORDER — IOHEXOL 300 MG/ML  SOLN
100.0000 mL | Freq: Once | INTRAMUSCULAR | Status: AC | PRN
  Administered 2022-10-30: 100 mL via INTRAVENOUS

## 2022-11-03 NOTE — Progress Notes (Unsigned)
BH MD Outpatient Progress Note  11/04/2022 4:33 PM [name]  MRN:  [id number]  Assessment:  Erin Pollard presents for follow-up evaluation. Today, 11/04/22, patient reports she continues to face significant stressors particularly in regards to housing instability, job search, relationship with daughter, and ongoing medical workups (although reports receiving reassuring news in this area recently).  Despite ongoing stress, she feels that she has been able to use coping skills to manage anxiety relatively well and denies any recent panic attacks.  Notably, patient has not had recent ED presentations for anxiety attacks. Patient may benefit from additional standing anxiolytic in the future however will defer while monitoring how symptoms respond to resolution of acute stressors.  RTC in approx. 2 months by video.  Identifying Information: Erin Pollard is a 52 y.o. female with a history of generalized anxiety disorder with panic attacks, HTN, past MI (2017), HLD, and IBS who is an established patient with [facility] participating in follow-up via video conferencing.   Plan:  # GAD with panic attacks Past medication trials: Zoloft, Valium (nausea), patient reports "numerous others" that requires further exploration Status of problem: chronic with mild exacerbation Interventions: -- Continue mirtazapine 30 mg qHS -- Continue Klonopin 0.5 mg TID PRN panic attacks (patient using TID)  -- Risks of long-term use have been extensively discussed and patient expressed understanding and desire to continue as she finds it effective with absence of side effects  -- PDMP reviewed with appropriate filling -- Could consider Cymbalta in the future given vasomotor symptoms of perimenopause -- Continue individual psychotherapy with [name], [facility]  Patient was given contact information for behavioral health clinic and was instructed to call 911 for emergencies.    Subjective:  Chief Complaint:  Chief Complaint  Patient presents with   Medication Management    Interval History:   Patient reports she will be starting pancreatic enzyme therapy for GI issues. Awaiting CT results.  Not able to afford to stay in current home and will have to move. Reports feeling frustrated but still hopeful. Reports she could stay with family up [place] or with sister in [place] although these are not her preferred options.  Endorses continued challenges searching for housing. In process of looking for jobs and has a few interviews coming up.   Has gotten to speak to [facility] more regularly. Only talking to daughter when necessary. Tries to get out of the house when she can and still stay connected with doing things she enjoys.   Reports having some anxiety around medical tests and uncertainty of the future but otherwise feels she has been managing anxiety fairly well despite major stressors.  She relays recent experience in which she thought she was going to have a panic attack but recognized elevated anxiety and was able to go to a quiet room, take deep breaths, and drink water with noted relief.  She does not feel additional medications are indicated at this time.  Discussed that if patient were to move out of state, would make sure that she has enough medications to bridge her until she can establish elsewhere.  She will keep clinic updated if she does decide to move.   PDMP: -- Klonopin 0.5 mg QTY 90 last filled 09/30/22 (rx dating back to 2021)  Visit Diagnosis:    ICD-10-CM   1. Generalized anxiety disorder with panic attacks  F41.1 mirtazapine (REMERON) 30 MG tablet   F41.0 clonazePAM (KLONOPIN) 0.5 MG tablet  Past Psychiatric History:  Diagnoses: GAD with panic attacks Medication trials: Zoloft, Valium (nausea), patient reports "numerous others" that requires further exploration Hospitalizations: x1 in 1996 for suicide attempt Suicide attempts: x1 in  1997 via cutting Substance use: denies use of etoh, tobacco, or illicit drugs  -- Tobacco: quit smoking 2017  Past Medical History:  Past Medical History:  Diagnosis Date   Anxiety    Cervical radiculopathy    GERD (gastroesophageal reflux disease)    Hiatal hernia 08/28/2022   [facility] was found through a CT scan and fibroids and cyst on liver parenchyma   Hypertension    Myocardial infarction (HCC)    Panic attack     Past Surgical History:  Procedure Laterality Date   LEFT HEART CATH     TUBAL LIGATION      Family Psychiatric History:  Reports 2 of her sons have struggled with substance use  Family History:  Family History  Problem Relation Age of Onset   Heart attack Mother    Cancer Father    Stroke Father    Stroke Sister    Heart disease Maternal Grandmother    Heart attack Maternal Grandmother    Heart disease Maternal Grandfather    Stroke Maternal Grandfather    Esophageal cancer Neg Hx    Colon cancer Neg Hx    Stomach cancer Neg Hx     Social History:  Social History   Socioeconomic History   Marital status: Divorced    Spouse name: Not on file   Number of children: 4   Years of education: Not on file   Highest education level: Associate degree: occupational, Scientist, product/process development, or vocational program  Occupational History   Not on file  Tobacco Use   Smoking status: Former    Current packs/day: 0.00    Types: Cigarettes    Quit date: 2017    Years since quitting: 7.7   Smokeless tobacco: Not on file  Vaping Use   Vaping status: Never Used  Substance and Sexual Activity   Alcohol use: Not Currently   Drug use: Never   Sexual activity: Yes  Other Topics Concern   Not on file  Social History Narrative   Not on file   Social Determinants of Health   Financial Resource Strain: Medium Risk (07/23/2022)   Overall Financial Resource Strain (CARDIA)    Difficulty of Paying Living Expenses: Somewhat hard  Food Insecurity: Food  Insecurity Present (07/23/2022)   Hunger Vital Sign    Worried About Running Out of Food in the Last Year: Sometimes true    Ran Out of Food in the Last Year: Sometimes true  Transportation Needs: No Transportation Needs (07/23/2022)   PRAPARE - Administrator, Civil Service (Medical): No    Lack of Transportation (Non-Medical): No  Physical Activity: Sufficiently Active (07/23/2022)   Exercise Vital Sign    Days of Exercise per Week: 5 days    Minutes of Exercise per Session: 30 min  Stress: No Stress Concern Present (07/23/2022)   [employer] of Occupational Health - Occupational Stress Questionnaire    Feeling of Stress : Only a little  Social Connections: Moderately Isolated (07/23/2022)   Social Connection and Isolation Panel [NHANES]    Frequency of Communication with Friends and Family: More than three times a week    Frequency of Social Gatherings with Friends and Family: More than three times a week    Attends Religious Services: 1 to  4 times per year    Active Member of Clubs or Organizations: No    Attends Banker Meetings: Not on file    Marital Status: Divorced    Allergies:  Allergies  Allergen Reactions   Amlodipine Hives    Achy legs and s.o.b Achy legs and s.o.b    Hydrocodone-Acetaminophen Hives and Rash   Iodinated Contrast Media Hives   Iodine Hives and Rash   Codeine    Hydrocortisone-Iodoquinol    Propoxyphene     Current Medications: Current Outpatient Medications  Medication Sig Dispense Refill   atorvastatin (LIPITOR) 40 MG tablet Take 1 tablet (40 mg total) by mouth at bedtime. 90 tablet 1   Bempedoic Acid-Ezetimibe (NEXLIZET) 180-10 MG TABS Take 1 tablet by mouth daily. 90 tablet 1   cholecalciferol (VITAMIN D3) 25 MCG (1000 UNIT) tablet Take 1,000 Units by mouth daily.     clonazePAM (KLONOPIN) 0.5 MG tablet Take 1 tablet (0.5 mg total) by mouth 3 (three) times daily. 90 tablet 2   diphenhydrAMINE (BENADRYL ALLERGY) 25 MG  tablet Take 2 tablets (50 mg total) by mouth as directed. 1 hour prior to your CT. 30 tablet 0   Eluxadoline (VIBERZI) 100 MG TABS Take 1 tablet (100 mg total) by mouth 2 (two) times daily before a meal. 60 tablet 3   famotidine (PEPCID) 20 MG tablet Take 20 mg by mouth as needed.     ferrous sulfate 325 (65 FE) MG EC tablet TAKE 1 TABLET BY MOUTH EVERY DAY 90 tablet 0   hydrALAZINE (APRESOLINE) 25 MG tablet Take 1 tablet (25 mg total) by mouth 3 (three) times daily. 270 tablet 3   hydrochlorothiazide (MICROZIDE) 12.5 MG capsule Take 1 capsule (12.5 mg total) by mouth daily. 90 capsule 1   KLOR-CON M10 10 MEQ tablet Take 10 mEq by mouth daily.     labetalol (NORMODYNE) 100 MG tablet Take 1 tablet (100 mg total) by mouth 2 (two) times daily. 180 tablet 0   lipase/protease/amylase (CREON) 36000 UNITS CPEP capsule Take 2 capsules (72,000 Units total) by mouth 3 (three) times daily with meals. May also take 1 capsule (36,000 Units total) as needed (with snacks - up to 2 snacks daily). 300 capsule 3   loratadine (CLARITIN) 10 MG tablet Take 1 tablet (10 mg total) by mouth daily. 30 tablet 6   losartan (COZAAR) 50 MG tablet Take 1 tablet (50 mg total) by mouth daily. 90 tablet 1   methocarbamol (ROBAXIN) 500 MG tablet Take 1 tablet (500 mg total) by mouth 2 (two) times daily. 20 tablet 0   mirtazapine (REMERON) 30 MG tablet Take 1 tablet (30 mg total) by mouth at bedtime. 30 tablet 2   naproxen (NAPROSYN) 500 MG tablet Take 1 tablet (500 mg total) by mouth 2 (two) times daily. 90 tablet 1   Omega-3 Fatty Acids (FISH OIL) 1000 MG CAPS Take 1 capsule by mouth daily.     ondansetron (ZOFRAN-ODT) 4 MG disintegrating tablet Take 1 tablet (4 mg total) by mouth every 8 (eight) hours as needed for nausea or vomiting. 20 tablet 0   oxymetazoline (AFRIN NASAL SPRAY) 0.05 % nasal spray Place 1 spray into both nostrils 2 (two) times daily. 15 mL 0   predniSONE (DELTASONE) 50 MG tablet Take 1 tablet by mouth 13 hours  prior to CT. Take 1 tablet by mouth 7 hours prior to CT. Take 1 tablet by mouth 1 hour prior to CT. 3 tablet 0  sucralfate (CARAFATE) 1 g tablet Take 1 tablet (1 g total) by mouth 4 (four) times daily -  with meals and at bedtime. 120 tablet 0   No current facility-administered medications for this visit.    ROS: Does not endorse any physical complaints  Objective:  Psychiatric Specialty Exam: There were no vitals taken for this visit.There is no height or weight on file to calculate BMI.  General Appearance: Casual and Well Groomed  Eye Contact:  Good  Speech:  Clear and Coherent and Normal Rate  Volume:  Normal  Mood:   "Doing the best I can"  Affect:   Full range - moderately anxious but brightens and laughs  Thought Content:  Denies AVH; IOR; paranoia    Suicidal Thoughts:  No  Homicidal Thoughts:  No  Thought Process:  Goal Directed and Linear  Orientation:  Full (Time, Place, and Person)    Memory:   Grossly intact  Judgment:  Good  Insight:  Good  Concentration:  Concentration: Good  Recall:  NA  Fund of Knowledge: Good  Language: Good  Psychomotor Activity:  Normal  Akathisia:  No  AIMS (if indicated): not done  Assets:  Communication Skills Desire for Improvement Housing Leisure Time Resilience Social Support Talents/Skills Transportation  ADL's:  Intact  Cognition: WNL  Sleep:  Good   PE: General: sits comfortably in view of camera; no acute distress  Pulm: no increased work of breathing on room air  MSK: all extremity movements appear intact  Neuro: no focal neurological deficits observed  Gait & Station: unable to assess by video    Metabolic Disorder Labs: Lab Results  Component Value Date   HGBA1C 5.6 12/11/2021   No results found for: "PROLACTIN" Lab Results  Component Value Date   CHOL 139 07/04/2022   TRIG 62 07/04/2022   HDL 37 (L) 07/04/2022   CHOLHDL 5.0 09/14/2021   VLDL 11 09/14/2021   LDLCALC 89 07/04/2022   LDLCALC 112 (H)  11/14/2021   No results found for: "TSH"  Therapeutic Level Labs: No results found for: "LITHIUM" No results found for: "VALPROATE" No results found for: "CBMZ"  Screenings:  GAD-7    Flowsheet Row Office Visit from 12/11/2021 in [facility] Video Visit from 10/02/2021 in [facility] Office Visit from 06/27/2021 in [facility] Office Visit from 04/19/2021 in [facility] Office Visit from 01/11/2021 in [facility]  Total GAD-7 Score 10 9 8 6  0      PHQ2-9    Flowsheet Row Office Visit from 12/11/2021 in [facility] Video Visit from 10/02/2021 in [facility] Office Visit from 06/27/2021 in [facility] Office Visit from 04/19/2021 in [facility] Office Visit from 01/11/2021 in [facility]  PHQ-2 Total Score 4 1 1 1  0  PHQ-9 Total Score 6 -- -- -- 0      Flowsheet Row ED from 08/05/2022 in [facility] Department at [facility] ED from 05/06/2022 in [facility] Department at [facility] ED from 03/27/2022 in [facility] Emergency Department at [facility]  C-SSRS RISK CATEGORY No Risk No Risk No Risk       Collaboration of Care: Collaboration of Care: Medication Management AEB ongoing medication management, Psychiatrist AEB established with this provider, and Referral or follow-up with counselor/therapist AEB established  with individual psychotherapy  Patient/Guardian was advised Release of Information must be obtained prior to any record release in order to collaborate their care with an outside provider. Patient/Guardian was advised if they have not already done so to contact the registration department to sign all necessary forms in order for [place] to release information  regarding their care.   Consent: Patient/Guardian gives verbal consent for treatment and assignment of benefits for services provided during this visit. Patient/Guardian expressed understanding and agreed to proceed.   Televisit via video: I connected with patient on 11/04/22 at  3:30 PM EDT by a video enabled telemedicine application and verified that I am speaking with the correct person using two identifiers.  Location: Patient: home address in [place] Provider: remote office in [place]   I discussed the limitations of evaluation and management by telemedicine and the availability of in person appointments. The patient expressed understanding and agreed to proceed.  I discussed the assessment and treatment plan with the patient. The patient was provided an opportunity to ask questions and all were answered. The patient agreed with the plan and demonstrated an understanding of the instructions.   The patient was advised to call back or seek an in-person evaluation if the symptoms worsen or if the condition fails to improve as anticipated.  I provided 40 minutes dedicated to the care of this patient via video on the date of this encounter to include chart review, face-to-face time with the patient, medication management/counseling, and brief therapeutic support.  Jonahtan Manseau A Rease Wence 11/04/2022, 4:33 PM

## 2022-11-04 ENCOUNTER — Encounter (HOSPITAL_COMMUNITY): Payer: Self-pay | Admitting: Psychiatry

## 2022-11-04 ENCOUNTER — Telehealth (INDEPENDENT_AMBULATORY_CARE_PROVIDER_SITE_OTHER): Payer: Medicaid Other | Admitting: Psychiatry

## 2022-11-04 DIAGNOSIS — F411 Generalized anxiety disorder: Secondary | ICD-10-CM | POA: Diagnosis not present

## 2022-11-04 DIAGNOSIS — F410 Panic disorder [episodic paroxysmal anxiety] without agoraphobia: Secondary | ICD-10-CM

## 2022-11-04 MED ORDER — MIRTAZAPINE 30 MG PO TABS: 30.0000 mg | ORAL_TABLET | Freq: Every evening | ORAL | 2 refills | Status: DC

## 2022-11-04 MED ORDER — CLONAZEPAM 0.5 MG PO TABS: 0.5000 mg | ORAL_TABLET | Freq: Three times a day (TID) | ORAL | 2 refills | Status: DC

## 2022-11-04 NOTE — Patient Instructions (Signed)
Thank you for attending your appointment today.  -- We did not make any medication changes today. Please continue medications as prescribed.  Please do not make any changes to medications without first discussing with your provider. If you are experiencing a psychiatric emergency, please call 911 or present to your nearest emergency department. Additional crisis, medication management, and therapy resources are included below.  [facility]  [address], [place], [place] [address] [phone] WALK-IN URGENT CARE 24/7 FOR ANYONE [address], [place], [place]  [phone] Fax: [phone] [email or web address] *Interpreters available *Accepts all insurance and uninsured for Urgent Care needs *Accepts Medicaid and uninsured for outpatient treatment (below)      ONLY FOR [place] Residents  Below:    Outpatient New Patient Assessment/Therapy Walk-ins:        Monday -Thursday 8am until slots are full.        Every Friday 1pm-4pm  (first come, first served)                   New Patient Psychiatry/Medication Management        Monday-Friday 8am-11am (first come, first served)               For all walk-ins we ask that you arrive by 7:15am, because patients will be seen in the order of arrival.   

## 2022-11-11 ENCOUNTER — Telehealth: Payer: Self-pay | Admitting: Gastroenterology

## 2022-11-11 NOTE — Telephone Encounter (Signed)
Inbound call from patient requesting a call to discuss recent CT results further. States she has additional question about CT results and her medication that she would like to discuss. Please advise, thank you.

## 2022-11-11 NOTE — Telephone Encounter (Signed)
Patient states that she understands Dr Frankey Shown notes regarding her recent CT. She does question what she needs to do about the small bilateral pleural effusions. I advised that this can com from a variety of causes (inflammation, infection, kidney disease; high blood pressure etc). However, advised since small, this would likely resolve on its own. Patient denies any upper respiratory symptoms. She is advised to follow with her primary physician in regards to the pulmonary effusions if the result is causing this is causing her anxiety. Patient also questions how to take creon. She states she picked this up last week but [facility] did not place any instructions on the bottle, only "use as directed." I have given her the instructions that were sent to [facility]: 2 capsules three times daily with meals, 1 capsule with snacks (up to 2 snacks daily). Patient verbalizes understanding.

## 2022-11-22 ENCOUNTER — Ambulatory Visit (INDEPENDENT_AMBULATORY_CARE_PROVIDER_SITE_OTHER): Payer: Medicaid Other | Admitting: Mental Health

## 2022-11-22 DIAGNOSIS — F411 Generalized anxiety disorder: Secondary | ICD-10-CM | POA: Diagnosis not present

## 2022-11-22 DIAGNOSIS — F410 Panic disorder [episodic paroxysmal anxiety] without agoraphobia: Secondary | ICD-10-CM

## 2022-11-22 NOTE — Progress Notes (Unsigned)
THERAPIST PROGRESS NOTE Virtual Visit via Video Note  I connected with Erin Pollard on 11/22/22 at 10:00 AM EDT by a video enabled telemedicine application and verified that I am speaking with the correct person using two identifiers.  Location: Patient: home address on file Provider: home office   I discussed the limitations of evaluation and management by telemedicine and the availability of in person appointments. The patient expressed understanding and agreed to proceed.  I discussed the assessment and treatment plan with the patient. The patient was provided an opportunity to ask questions and all were answered. The patient agreed with the plan and demonstrated an understanding of the instructions.   The patient was advised to call back or seek an in-person evaluation if the symptoms worsen or if the condition fails to improve as anticipated.  I provided 55 minutes of non-face-to-face time during this encounter.   [name], [facility]   Session Time: 10:04 am ( 55 minutes)  Participation Level: Active  Behavioral Response: DisheveledAlertDepressed and Dysphoric  Type of Therapy: Individual Therapy  Treatment Goals addressed: Erin Pollard will increase management of anxiety AEB ability to engage in use of x 3 effective coping skills for anxiety with ability to reframe maladaptive thinking patterns daily as needed within the next 90 days.    ProgressTowards Goals: Progressing  Interventions: Supportive  Summary:  Erin Pollard is a 52 y.o. female who presents with dx of generalized anxiety disorder with panic.  Erin Pollard presents to session alert and oriented; mood and affect irritable,low and depressed. Anxiety increased with presence of psychosocial stressors and reports coping skills of cognitive coping and taking time to be alone. Notes for family members to be causing her added stress sharing to be attempting to offer help and support but to not be feasible for her  at this time. Shares stress related to high light bill and not able to pay and engaging with community agencies in effort to gain assistance. Notes to have been able to secure a job however unclear of how she will get there. Son states to take bus however notes bus will take an hour and half ride for a ride that takes x 6 minutes in a car. Shares not wanting to do this but will if she has to. Shares has bene able to get her own food stamps. Shares thoughts on not wanting to move to [place] with her sister and for them to currently be clashing. Shares ongoing frustration with current stressors related to housing and working to regain stability in the community. Denies referral for CST services at this time and denies to want to change therapist. Shares plans to cut her phone off and relax. Receptive of website on further information on routes and times with [employer]. Denies SI/HI. Continues to work towards goals.    Suicidal/Homicidal: Nowithout intent/plan  Therapist Response: Therapist engaged [name] in tele-therapy session. Assessed for current location and ability to hold confidential session. Assessed for current level of functioning sxs management and sxs and level of stressors. Assessed for sxs of depression and anxiety and ability to cope. Active empathic listening, offering support and encouragement. Supported in processing current stressors and supported with exploring options. Educated on availability of possible app and working to understand bus system better. Provided resource. Educated on option of additional support with case management and increasing stability in community with CST. Reviewed session and provided follow up.    Plan: Return again in  x 7 weeks.  Diagnosis: Generalized anxiety disorder with panic attacks  Collaboration of Care: Other None  Patient/Guardian was advised Release of Information must be obtained prior to any record release in order to  collaborate their care with an outside provider. Patient/Guardian was advised if they have not already done so to contact the registration department to sign all necessary forms in order for [place] to release information regarding their care.   Consent: Patient/Guardian gives verbal consent for treatment and assignment of benefits for services provided during this visit. Patient/Guardian expressed understanding and agreed to proceed.   Erin Pollard, [facility] 11/22/2022

## 2022-11-26 ENCOUNTER — Other Ambulatory Visit: Payer: Self-pay | Admitting: Gastroenterology

## 2022-11-27 ENCOUNTER — Telehealth: Payer: Self-pay | Admitting: Gastroenterology

## 2022-11-27 NOTE — Telephone Encounter (Signed)
Inbound call from patient requesting a call to discuss Creon. States pharmacy has been giving her a hard time when trying to receive medication. Patient is requesting a call to discuss further. Please advise. Thank you

## 2022-11-27 NOTE — Telephone Encounter (Signed)
Spoke with patient who states that she was given 2 weeks worth of creon previously at [facility] because they told her that this was all they had in stock. States she attempted to pick up the remainder and was told that she would have to pay an additional copay for this. She then states that she got in a verbal altercation somewhat with the pharmacist as she tried to explain that she was not given the full script the first time and the pharmacist then told her they were going to cancel out the prescription all together and would need a new prescription from our office for Creon in order to give her the medication.  I advised that we actually sent a prescription again to [facility] today, 9:02 am so this should be available to her. Patient thanked me for the return call and said she would be back in touch with the pharmacist.

## 2022-12-03 NOTE — Telephone Encounter (Signed)
Inbound call from patient, would like to follow up on Creon medication, she states she is still having diarrhea after taking the medication and would like to follow up.

## 2022-12-04 NOTE — Telephone Encounter (Signed)
I have spoken to patient to advise she start taking viberzi twice daily and decrease creon to 1 capsule with each meal for now. Advised that if she starts having constipation, she should completely discontinue creon. Patient verbalizes understanding.

## 2022-12-04 NOTE — Telephone Encounter (Signed)
Patient calls c/o continued diarrhea and nocturnal stools despite the addition of creon 36K, 2 capsules with meals, 1 with snacks. Although she does feel that her diarrhea has improved slightly, she states that it is still bothersome and interferes with daily life. She also notes that she has some abdominal cramping at times along with bowel movements. No blood noted. She says imodium does help when she takes it. Patient is currently scheduled for endoscopy/colonoscopy 12/23/22.   The plan in office was originally to provide viberzi twice daily on trial. However, fecal elastase showed pancreatic insufficiency, so we instead opted to provide creon instead. Patient says pharmacy told her that they do have viberzi available. Would it be appropriate to have her go ahead and add this medication since she has diarrhea despite creon?

## 2022-12-23 ENCOUNTER — Ambulatory Visit: Payer: Medicaid Other | Admitting: Gastroenterology

## 2022-12-23 ENCOUNTER — Encounter: Payer: Self-pay | Admitting: Gastroenterology

## 2022-12-23 VITALS — BP 143/77 | HR 87 | Temp 97.0°F | Resp 18 | Ht 60.0 in | Wt 160.0 lb

## 2022-12-23 DIAGNOSIS — R152 Fecal urgency: Secondary | ICD-10-CM

## 2022-12-23 DIAGNOSIS — R1013 Epigastric pain: Secondary | ICD-10-CM

## 2022-12-23 DIAGNOSIS — R195 Other fecal abnormalities: Secondary | ICD-10-CM

## 2022-12-23 DIAGNOSIS — K5730 Diverticulosis of large intestine without perforation or abscess without bleeding: Secondary | ICD-10-CM | POA: Diagnosis not present

## 2022-12-23 DIAGNOSIS — Z800 Family history of malignant neoplasm of digestive organs: Secondary | ICD-10-CM

## 2022-12-23 DIAGNOSIS — R194 Change in bowel habit: Secondary | ICD-10-CM

## 2022-12-23 DIAGNOSIS — K562 Volvulus: Secondary | ICD-10-CM | POA: Diagnosis not present

## 2022-12-23 DIAGNOSIS — K2950 Unspecified chronic gastritis without bleeding: Secondary | ICD-10-CM

## 2022-12-23 DIAGNOSIS — I252 Old myocardial infarction: Secondary | ICD-10-CM | POA: Diagnosis not present

## 2022-12-23 DIAGNOSIS — K2970 Gastritis, unspecified, without bleeding: Secondary | ICD-10-CM | POA: Diagnosis not present

## 2022-12-23 DIAGNOSIS — I10 Essential (primary) hypertension: Secondary | ICD-10-CM | POA: Diagnosis not present

## 2022-12-23 DIAGNOSIS — K3189 Other diseases of stomach and duodenum: Secondary | ICD-10-CM

## 2022-12-23 DIAGNOSIS — K219 Gastro-esophageal reflux disease without esophagitis: Secondary | ICD-10-CM | POA: Diagnosis not present

## 2022-12-23 DIAGNOSIS — R197 Diarrhea, unspecified: Secondary | ICD-10-CM | POA: Diagnosis not present

## 2022-12-23 DIAGNOSIS — K5790 Diverticulosis of intestine, part unspecified, without perforation or abscess without bleeding: Secondary | ICD-10-CM

## 2022-12-23 DIAGNOSIS — F419 Anxiety disorder, unspecified: Secondary | ICD-10-CM | POA: Diagnosis not present

## 2022-12-23 DIAGNOSIS — I2510 Atherosclerotic heart disease of native coronary artery without angina pectoris: Secondary | ICD-10-CM | POA: Diagnosis not present

## 2022-12-23 MED ORDER — OMEPRAZOLE 40 MG PO CPDR
DELAYED_RELEASE_CAPSULE | ORAL | 3 refills | Status: AC
Start: 2022-12-23 — End: ?

## 2022-12-23 MED ORDER — SODIUM CHLORIDE 0.9 % IV SOLN: 500.0000 mL | Freq: Once | INTRAVENOUS | Status: DC

## 2022-12-23 NOTE — Progress Notes (Signed)
Pt's states no medical or surgical changes since previsit or office visit. 

## 2022-12-23 NOTE — Op Note (Signed)
[facility] Patient Name: Erin Pollard Procedure Date: 12/23/2022 8:26 AM MRN: [id number] Endoscopist: Doristine Locks , MD, [id number] Age: 52 Referring MD:  Date of Birth: Apr 05, 1970 Gender: Female Account #: [id number] Procedure:                Colonoscopy Indications:              Epigastric abdominal pain, Change in bowel habits,                            Diarrhea Medicines:                Monitored Anesthesia Care Procedure:                Pre-Anesthesia Assessment:                           - Prior to the procedure, a History and Physical                            was performed, and patient medications and                            allergies were reviewed. The patient's tolerance of                            previous anesthesia was also reviewed. The risks                            and benefits of the procedure and the sedation                            options and risks were discussed with the patient.                            All questions were answered, and informed consent                            was obtained. Prior Anticoagulants: The patient has                            taken no anticoagulant or antiplatelet agents. ASA                            Grade Assessment: III - A patient with severe                            systemic disease. After reviewing the risks and                            benefits, the patient was deemed in satisfactory                            condition to undergo the procedure.  After obtaining informed consent, the colonoscope                            was passed under direct vision. Throughout the                            procedure, the patient's blood pressure, pulse, and                            oxygen saturations were monitored continuously. The                            Olympus CF-HQ190L ([id number]) Colonoscope was                            introduced through the anus and advanced to the  the                            terminal ileum. The colonoscopy was technically                            difficult and complex due to significant looping.                            Successful completion of the procedure was aided by                            applying abdominal pressure. The patient tolerated                            the procedure well. The quality of the bowel                            preparation was good. The terminal ileum, ileocecal                            valve, appendiceal orifice, and rectum were                            photographed. Scope In: 8:45:46 AM Scope Out: 9:11:35 AM Scope Withdrawal Time: 0 hours 20 minutes 32 seconds  Total Procedure Duration: 0 hours 25 minutes 49 seconds  Findings:                 The perianal and digital rectal examinations were                            normal.                           Multiple large-mouthed and small-mouthed                            diverticula were found in the entire colon.  Normal mucosa was found in the entire colon.                            Biopsies for histology were taken with a cold                            forceps from the right colon and left colon for                            evaluation of microscopic colitis. Estimated blood                            loss was minimal.                           The ascending colon revealed significantly                            excessive looping. Advancing the scope required                            using manual pressure.                           The retroflexed view of the distal rectum and anal                            verge was normal and showed no anal or rectal                            abnormalities.                           The terminal ileum appeared normal. Complications:            No immediate complications. Estimated Blood Loss:     Estimated blood loss was minimal. Impression:               -  Diverticulosis in the entire examined colon.                           - Normal mucosa in the entire examined colon.                            Biopsied.                           - There was significant looping of the colon.                           - The distal rectum and anal verge are normal on                            retroflexion view.                           -  The examined portion of the ileum was normal. Recommendation:           - Patient has a contact number available for                            emergencies. The signs and symptoms of potential                            delayed complications were discussed with the                            patient. Return to normal activities tomorrow.                            Written discharge instructions were provided to the                            patient.                           - Resume previous diet.                           - Continue present medications.                           - Await pathology results.                           - Repeat colonoscopy in 5 years for screening                            purposes due to family history.                           - Return to GI office PRN. Doristine Locks, MD 12/23/2022 9:23:10 AM

## 2022-12-23 NOTE — Patient Instructions (Signed)
Upper Endoscopy:  Increase Prilosec to 40 mg twice a day for 4 weeks ,then back to 40 mg daily if that controls reflux symptoms   Await biopsy results   Handout on gastritis given to you today    Colonoscopy:  Await biopsy results   Handout on diverticulosis given to you today   YOU HAD AN ENDOSCOPIC PROCEDURE TODAY AT THE [facility]:   Refer to the procedure report that was given to you for any specific questions about what was found during the examination.  If the procedure report does not answer your questions, please call your gastroenterologist to clarify.  If you requested that your care partner not be given the details of your procedure findings, then the procedure report has been included in a sealed envelope for you to review at your convenience later.  YOU SHOULD EXPECT: Some feelings of bloating in the abdomen. Passage of more gas than usual.  Walking can help get rid of the air that was put into your GI tract during the procedure and reduce the bloating. If you had a lower endoscopy (such as a colonoscopy or flexible sigmoidoscopy) you may notice spotting of blood in your stool or on the toilet paper. If you underwent a bowel prep for your procedure, you may not have a normal bowel movement for a few days.  Please Note:  You might notice some irritation and congestion in your nose or some drainage.  This is from the oxygen used during your procedure.  There is no need for concern and it should clear up in a day or so.  SYMPTOMS TO REPORT IMMEDIATELY:  Following lower endoscopy (colonoscopy or flexible sigmoidoscopy):  Excessive amounts of blood in the stool  Significant tenderness or worsening of abdominal pains  Swelling of the abdomen that is new, acute  Fever of 100F or higher  Following upper endoscopy (EGD)  Vomiting of blood or coffee ground material  New chest pain or pain under the shoulder blades  Painful or persistently difficult  swallowing  New shortness of breath  Fever of 100F or higher  Black, tarry-looking stools  For urgent or emergent issues, a gastroenterologist can be reached at any hour by calling [phone]. Do not use MyChart messaging for urgent concerns.    DIET:  We do recommend a small meal at first, but then you may proceed to your regular diet.  Drink plenty of fluids but you should avoid alcoholic beverages for 24 hours.  ACTIVITY:  You should plan to take it easy for the rest of today and you should NOT DRIVE or use heavy machinery until tomorrow (because of the sedation medicines used during the test).    FOLLOW UP: Our staff will call the number listed on your records the next business day following your procedure.  We will call around 7:15- 8:00 am to check on you and address any questions or concerns that you may have regarding the information given to you following your procedure. If we do not reach you, we will leave a message.     If any biopsies were taken you will be contacted by phone or by letter within the next 1-3 weeks.  Please call us at [phone] if you have not heard about the biopsies in 3 weeks.    SIGNATURES/CONFIDENTIALITY: You and/or your care partner have signed paperwork which will be entered into your electronic medical record.  These signatures attest to the fact that that the information  above on your After Visit Summary has been reviewed and is understood.  Full responsibility of the confidentiality of this discharge information lies with you and/or your care-partner.

## 2022-12-23 NOTE — Progress Notes (Signed)
Called to room to assist during endoscopic procedure.  Patient ID and intended procedure confirmed with present staff. Received instructions for my participation in the procedure from the performing physician.  

## 2022-12-23 NOTE — Op Note (Signed)
[facility] Patient Name: Erin Pollard Procedure Date: 12/23/2022 8:31 AM MRN: [id number] Endoscopist: Doristine Locks , MD, [id number] Age: 52 Referring MD:  Date of Birth: 11/18/1970 Gender: Female Account #: [id number] Procedure:                Upper GI endoscopy Indications:              Epigastric abdominal pain, Esophageal reflux,                            Change in bowel habits, diarrhea/loose stools Medicines:                Monitored Anesthesia Care Procedure:                Pre-Anesthesia Assessment:                           - Prior to the procedure, a History and Physical                            was performed, and patient medications and                            allergies were reviewed. The patient's tolerance of                            previous anesthesia was also reviewed. The risks                            and benefits of the procedure and the sedation                            options and risks were discussed with the patient.                            All questions were answered, and informed consent                            was obtained. Prior Anticoagulants: The patient has                            taken no anticoagulant or antiplatelet agents. ASA                            Grade Assessment: III - A patient with severe                            systemic disease. After reviewing the risks and                            benefits, the patient was deemed in satisfactory                            condition to undergo the procedure.  After obtaining informed consent, the endoscope was                            passed under direct vision. Throughout the                            procedure, the patient's blood pressure, pulse, and                            oxygen saturations were monitored continuously. The                            GIF [id number] #[id number] was introduced through the                            mouth, and  advanced to the second part of duodenum.                            The upper GI endoscopy was accomplished without                            difficulty. The patient tolerated the procedure                            well. Scope In: Scope Out: Findings:                 The examined esophagus was normal.                           The Z-line was regular and was found 38 cm from the                            incisors.                           Localized mild inflammation characterized by                            congestion (edema) and erythema was found in the                            gastric antrum. Biopsies were taken with a cold                            forceps for histology. Estimated blood loss was                            minimal.                           The gastric fundus, gastric body and incisura were                            normal. Biopsies were taken with a cold forceps for  Helicobacter pylori testing. Estimated blood loss                            was minimal.                           The gastroesophageal flap valve was visualized                            endoscopically and classified as Hill Grade III                            (minimal fold, loose to endoscope, hiatal hernia                            likely).                           Diffuse mucosal changes characterized by dark                            pigmentation consistent with pseudomelanosis                            duodeni were found in the entire duodenum. Biopsies                            were taken with a cold forceps for histology.                            Estimated blood loss was minimal. Complications:            No immediate complications. Estimated Blood Loss:     Estimated blood loss was minimal. Impression:               - Normal esophagus.                           - Z-line regular, 38 cm from the incisors.                           - Mild antral  gastritis. Biopsied.                           - Normal gastric fundus, gastric body and incisura.                            Biopsied.                           - Gastroesophageal flap valve classified as Hill                            Grade III (minimal fold, loose to endoscope, hiatal                            hernia likely).                           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 Biopsied. Recommendation:           - Patient has a contact number available for                            emergencies. The signs and symptoms of potential                            delayed complications were discussed with the                            patient. Return to normal activities tomorrow.                            Written discharge instructions were provided to the                            patient.                           - Resume previous diet.                           - Await pathology results.                           - Increase Prilosec (omeprazole) to 40 mg PO BID                            for 4 weeks to promote mucosal healing, then reduce                            back to 40 mg daily if that controls reflux                            symptoms.                           - Colonoscopy today. Doristine Locks, MD 12/23/2022 9:19:19 AM

## 2022-12-23 NOTE — Progress Notes (Signed)
Approx 11 min of abd pressure used to "get back into the cecum" ([id number])  Robinul snd Zofran given

## 2022-12-23 NOTE — Progress Notes (Signed)
GASTROENTEROLOGY PROCEDURE H&P NOTE   Primary Care Physician: Grayce Sessions, NP    Reason for Procedure:  GERD, hiatal hernia, epigastric pain, change in bowel habits, fecal urgency, diarrhea, family history of colon cancer  Plan:    EGD, colonoscopy  Patient is appropriate for endoscopic procedure(s) in the ambulatory ([facility]) setting.  The nature of the procedure, as well as the risks, benefits, and alternatives were carefully and thoroughly reviewed with the patient. Ample time for discussion and questions allowed. The patient understood, was satisfied, and agreed to proceed.     HPI: Erin Pollard is a 52 y.o. female who presents for EGD and colonoscopy for evaluation of multiple GI symptoms to include GERD, hiatal hernia, epigastric pain, change in bowel habits, diarrhea, along with ongoing screening due to family history of colon cancer (father).  Recent evaluation notable for fecal calprotectin 73 (borderline), elastase 144.  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  She was started on Creon with some improvement but not resolution.  Was also still using Imodium on demand. Prescribed Viberzi about 2 weeks ago, but has not yet started.  Past Medical History:  Diagnosis Date   Anxiety    Cervical radiculopathy    GERD (gastroesophageal reflux disease)    Hiatal hernia 08/28/2022   [facility] was found through a CT scan and fibroids and cyst on liver parenchyma   Hypertension    Myocardial infarction (HCC)    Panic attack     Past Surgical History:  Procedure Laterality Date   LEFT HEART CATH     TUBAL LIGATION      Prior to Admission medications   Medication Sig Start Date End Date Taking? Authorizing Provider  atorvastatin (LIPITOR) 40 MG tablet Take 1 tablet (40 mg total) by mouth at bedtime. 10/08/22 04/06/23  Tolia, Sunit, DO  Bempedoic Acid-Ezetimibe (NEXLIZET) 180-10 MG  TABS Take 1 tablet by mouth daily. 10/08/22 04/06/23  Tolia, Sunit, DO  cholecalciferol (VITAMIN D3) 25 MCG (1000 UNIT) tablet Take 1,000 Units by mouth daily.    [provider]  clonazePAM (KLONOPIN) 0.5 MG tablet Take 1 tablet (0.5 mg total) by mouth 3 (three) times daily. 11/04/22 02/02/23  Bahraini, Sarah A  diphenhydrAMINE (BENADRYL ALLERGY) 25 MG tablet Take 2 tablets (50 mg total) by mouth as directed. 1 hour prior to your CT. 10/21/22   Aubreana Cornacchia V, DO  Eluxadoline (VIBERZI) 100 MG TABS Take 1 tablet (100 mg total) by mouth 2 (two) times daily before a meal. 10/08/22   Yousof Alderman V, DO  famotidine (PEPCID) 20 MG tablet Take 20 mg by mouth as needed.    [provider]  ferrous sulfate 325 (65 FE) MG EC tablet TAKE 1 TABLET BY MOUTH EVERY DAY 02/18/22   Grayce Sessions, NP  hydrALAZINE (APRESOLINE) 25 MG tablet Take 1 tablet (25 mg total) by mouth 3 (three) times daily. 10/08/22   Tolia, Sunit, DO  hydrochlorothiazide (MICROZIDE) 12.5 MG capsule Take 1 capsule (12.5 mg total) by mouth daily. 10/08/22 04/06/23  Tolia, Sunit, DO  KLOR-CON M10 10 MEQ tablet Take 10 mEq by mouth daily. 08/31/21   [provider]  labetalol (NORMODYNE) 100 MG tablet Take 1 tablet (100 mg total) by mouth 2 (two) times daily. 10/08/22 01/06/23  Tolia, Sunit, DO  lipase/protease/amylase (CREON) 36000 UNITS CPEP capsule Take 2 capsules (72,000 Units total) by mouth 3 (three) times daily  with meals. May also take 1 capsule (36,000 Units total) as needed (with snacks - up to 2 snacks daily). 11/27/22   Adlee Paar V, DO  loratadine (CLARITIN) 10 MG tablet Take 1 tablet (10 mg total) by mouth daily. 11/02/21   Grayce Sessions, NP  losartan (COZAAR) 50 MG tablet Take 1 tablet (50 mg total) by mouth daily. 10/08/22 04/06/23  Tolia, Sunit, DO  methocarbamol (ROBAXIN) 500 MG tablet Take 1 tablet (500 mg total) by mouth 2 (two) times daily. 07/29/21   Carlisle Beers, FNP  mirtazapine  (REMERON) 30 MG tablet Take 1 tablet (30 mg total) by mouth at bedtime. 11/04/22 02/02/23  Bahraini, Sarah A  naproxen (NAPROSYN) 500 MG tablet Take 1 tablet (500 mg total) by mouth 2 (two) times daily. 09/11/21   Tolia, Sunit, DO  Omega-3 Fatty Acids (FISH OIL) 1000 MG CAPS Take 1 capsule by mouth daily.    [provider]  ondansetron (ZOFRAN-ODT) 4 MG disintegrating tablet Take 1 tablet (4 mg total) by mouth every 8 (eight) hours as needed for nausea or vomiting. 07/29/21   Carlisle Beers, FNP  oxymetazoline (AFRIN NASAL SPRAY) 0.05 % nasal spray Place 1 spray into both nostrils 2 (two) times daily. 11/02/21   Grayce Sessions, NP  predniSONE (DELTASONE) 50 MG tablet Take 1 tablet by mouth 13 hours prior to CT. Take 1 tablet by mouth 7 hours prior to CT. Take 1 tablet by mouth 1 hour prior to CT. 10/21/22   Luanne Krzyzanowski V, DO  sucralfate (CARAFATE) 1 g tablet Take 1 tablet (1 g total) by mouth 4 (four) times daily -  with meals and at bedtime. 04/20/21   Roxy Horseman, PA-C    Current Outpatient Medications  Medication Sig Dispense Refill   atorvastatin (LIPITOR) 40 MG tablet Take 1 tablet (40 mg total) by mouth at bedtime. 90 tablet 1   Bempedoic Acid-Ezetimibe (NEXLIZET) 180-10 MG TABS Take 1 tablet by mouth daily. 90 tablet 1   cholecalciferol (VITAMIN D3) 25 MCG (1000 UNIT) tablet Take 1,000 Units by mouth daily.     clonazePAM (KLONOPIN) 0.5 MG tablet Take 1 tablet (0.5 mg total) by mouth 3 (three) times daily. 90 tablet 2   diphenhydrAMINE (BENADRYL ALLERGY) 25 MG tablet Take 2 tablets (50 mg total) by mouth as directed. 1 hour prior to your CT. 30 tablet 0   Eluxadoline (VIBERZI) 100 MG TABS Take 1 tablet (100 mg total) by mouth 2 (two) times daily before a meal. 60 tablet 3   famotidine (PEPCID) 20 MG tablet Take 20 mg by mouth as needed.     ferrous sulfate 325 (65 FE) MG EC tablet TAKE 1 TABLET BY MOUTH EVERY DAY 90 tablet 0   hydrALAZINE (APRESOLINE) 25 MG tablet  Take 1 tablet (25 mg total) by mouth 3 (three) times daily. 270 tablet 3   hydrochlorothiazide (MICROZIDE) 12.5 MG capsule Take 1 capsule (12.5 mg total) by mouth daily. 90 capsule 1   KLOR-CON M10 10 MEQ tablet Take 10 mEq by mouth daily.     labetalol (NORMODYNE) 100 MG tablet Take 1 tablet (100 mg total) by mouth 2 (two) times daily. 180 tablet 0   lipase/protease/amylase (CREON) 36000 UNITS CPEP capsule Take 2 capsules (72,000 Units total) by mouth 3 (three) times daily with meals. May also take 1 capsule (36,000 Units total) as needed (with snacks - up to 2 snacks daily). 300 capsule 1   loratadine (CLARITIN) 10 MG tablet  Take 1 tablet (10 mg total) by mouth daily. 30 tablet 6   losartan (COZAAR) 50 MG tablet Take 1 tablet (50 mg total) by mouth daily. 90 tablet 1   methocarbamol (ROBAXIN) 500 MG tablet Take 1 tablet (500 mg total) by mouth 2 (two) times daily. 20 tablet 0   mirtazapine (REMERON) 30 MG tablet Take 1 tablet (30 mg total) by mouth at bedtime. 30 tablet 2   naproxen (NAPROSYN) 500 MG tablet Take 1 tablet (500 mg total) by mouth 2 (two) times daily. 90 tablet 1   Omega-3 Fatty Acids (FISH OIL) 1000 MG CAPS Take 1 capsule by mouth daily.     ondansetron (ZOFRAN-ODT) 4 MG disintegrating tablet Take 1 tablet (4 mg total) by mouth every 8 (eight) hours as needed for nausea or vomiting. 20 tablet 0   oxymetazoline (AFRIN NASAL SPRAY) 0.05 % nasal spray Place 1 spray into both nostrils 2 (two) times daily. 15 mL 0   predniSONE (DELTASONE) 50 MG tablet Take 1 tablet by mouth 13 hours prior to CT. Take 1 tablet by mouth 7 hours prior to CT. Take 1 tablet by mouth 1 hour prior to CT. 3 tablet 0   sucralfate (CARAFATE) 1 g tablet Take 1 tablet (1 g total) by mouth 4 (four) times daily -  with meals and at bedtime. 120 tablet 0   No current facility-administered medications for this visit.    Allergies as of 12/23/2022 - Review Complete 11/04/2022  Allergen Reaction Noted   Amlodipine  Hives 11/15/2008   Hydrocodone-acetaminophen Hives and Rash 09/06/2008   Iodinated contrast media Hives 01/28/2020   Iodine Hives and Rash 03/17/2019   Codeine  12/11/2021   Hydrocortisone-iodoquinol  05/28/2014   Propoxyphene  05/28/2014    Family History  Problem Relation Age of Onset   Heart attack Mother    Cancer Father    Stroke Father    Stroke Sister    Heart disease Maternal Grandmother    Heart attack Maternal Grandmother    Heart disease Maternal Grandfather    Stroke Maternal Grandfather    Esophageal cancer Neg Hx    Colon cancer Neg Hx    Stomach cancer Neg Hx     Social History   Socioeconomic History   Marital status: Divorced    Spouse name: Not on file   Number of children: 4   Years of education: Not on file   Highest education level: Associate degree: occupational, Scientist, product/process development, or vocational program  Occupational History   Not on file  Tobacco Use   Smoking status: Former    Current packs/day: 0.00    Types: Cigarettes    Quit date: 2017    Years since quitting: 7.9   Smokeless tobacco: Not on file  Vaping Use   Vaping status: Never Used  Substance and Sexual Activity   Alcohol use: Not Currently   Drug use: Never   Sexual activity: Yes  Other Topics Concern   Not on file  Social History Narrative   Not on file   Social Determinants of Health   Financial Resource Strain: Medium Risk (07/23/2022)   Overall Financial Resource Strain (CARDIA)    Difficulty of Paying Living Expenses: Somewhat hard  Food Insecurity: Food Insecurity Present (07/23/2022)   Hunger Vital Sign    Worried About Running Out of Food in the Last Year: Sometimes true    Ran Out of Food in the Last Year: Sometimes true  Transportation Needs: No Transportation Needs (  07/23/2022)   PRAPARE - Administrator, Civil Service (Medical): No    Lack of Transportation (Non-Medical): No  Physical Activity: Sufficiently Active (07/23/2022)   Exercise Vital Sign    Days of  Exercise per Week: 5 days    Minutes of Exercise per Session: 30 min  Stress: No Stress Concern Present (07/23/2022)   [employer] of Occupational Health - Occupational Stress Questionnaire    Feeling of Stress : Only a little  Social Connections: Moderately Isolated (07/23/2022)   Social Connection and Isolation Panel [NHANES]    Frequency of Communication with Friends and Family: More than three times a week    Frequency of Social Gatherings with Friends and Family: More than three times a week    Attends Religious Services: 1 to 4 times per year    Active Member of [employer] or Organizations: No    Attends Engineer, structural: Not on file    Marital Status: Divorced  Intimate Partner Violence: Not on file    Physical Exam: Vital signs in last 24 hours: @There  were no vitals taken for this visit. GEN: NAD EYE: Sclerae anicteric ENT: MMM CV: Non-tachycardic Pulm: CTA b/l GI: Soft, NT/ND NEURO:  Alert & Oriented x 3   Doristine Locks, DO [facility]   12/23/2022 7:48 AM

## 2022-12-23 NOTE — Progress Notes (Signed)
Report to PACU, RN, vss, BBS= Clear.  

## 2022-12-24 ENCOUNTER — Telehealth: Payer: Self-pay

## 2022-12-24 NOTE — Telephone Encounter (Signed)
  Follow up Call-     12/23/2022    7:56 AM  Call back number  Post procedure Call Back phone  # [phone]  Permission to leave phone message Yes     Patient questions:  Do you have a fever, pain , or abdominal swelling? No. Pain Score  0 *  Have you tolerated food without any problems? Yes.    Have you been able to return to your normal activities? Yes.    Do you have any questions about your discharge instructions: Diet   No. Medications  No. Follow up visit  No.  Do you have questions or concerns about your Care? No.  Actions: * If pain score is 4 or above: No action needed, pain <4.

## 2022-12-25 LAB — SURGICAL PATHOLOGY

## 2022-12-27 NOTE — Progress Notes (Unsigned)
Patient did not connect for virtual psychiatric medication management appointment on 12/30/22 at 2:30PM. Sent secure video link with no response. Called phone with no answer; unable to leave VM.   Daine Gip, MD 12/30/22

## 2022-12-30 ENCOUNTER — Encounter (HOSPITAL_COMMUNITY): Payer: Medicaid Other | Admitting: Psychiatry

## 2022-12-30 ENCOUNTER — Encounter (HOSPITAL_COMMUNITY): Payer: Self-pay

## 2022-12-31 ENCOUNTER — Other Ambulatory Visit (HOSPITAL_COMMUNITY): Payer: Self-pay | Admitting: Psychiatry

## 2022-12-31 ENCOUNTER — Telehealth (HOSPITAL_COMMUNITY): Payer: Medicaid Other | Admitting: Psychiatry

## 2022-12-31 DIAGNOSIS — F410 Panic disorder [episodic paroxysmal anxiety] without agoraphobia: Secondary | ICD-10-CM

## 2022-12-31 MED ORDER — CLONAZEPAM 0.5 MG PO TABS: 0.5000 mg | ORAL_TABLET | Freq: Three times a day (TID) | ORAL | 2 refills | Status: DC

## 2022-12-31 MED ORDER — MIRTAZAPINE 30 MG PO TABS
30.0000 mg | ORAL_TABLET | Freq: Every evening | ORAL | 2 refills | Status: DC
Start: 2022-12-31 — End: 2023-02-24

## 2022-12-31 NOTE — Progress Notes (Signed)
Patient called front desk to reschedule missed appointment yesterday 12/30/22 with this provider. Apologized for missing appt, stating she forgot as she recently started a new job. She has been rescheduled to my next available on 02/23/22 at 3:30PM by video. Will send in bridge of mirtazapine 30 mg at bedtime and Klonopin 0.5 mg TID PRN panic attacks until that appointment.  Daine Gip, MD 12/31/22

## 2023-01-03 ENCOUNTER — Telehealth: Payer: Self-pay | Admitting: Pharmacy Technician

## 2023-01-03 ENCOUNTER — Other Ambulatory Visit (HOSPITAL_COMMUNITY): Payer: Self-pay

## 2023-01-03 NOTE — Telephone Encounter (Signed)
Pharmacy Patient Advocate Encounter  Received notification from [facility] MEDICAID that Prior Authorization for VIBERZI 100 MG  has been APPROVED from 10/25/22 to 10/25/23. Ran test claim, Copay is $4.00. This test claim was processed through [facility]- copay amounts may vary at other pharmacies due to pharmacy/plan contracts, or as the patient moves through the different stages of their insurance plan.   PA #/Case ID/Reference #: [id number]

## 2023-01-03 NOTE — Telephone Encounter (Signed)
Pharmacy Patient Advocate Encounter  Received notification from [facility] that Prior Authorization for VIBERZI 100 MG has been APPROVED from 10/25/22 to 10/25/23 . To be noted the rx current on file is at a [facility] in [place] that is closed. Also, it was noted on 12/23/22 that pt is not taking this medication.   PA #/Case ID/Reference #: [id number]

## 2023-01-06 ENCOUNTER — Telehealth: Payer: Self-pay | Admitting: Gastroenterology

## 2023-01-06 NOTE — Telephone Encounter (Signed)
Patient calls stating that ever since she had endoscopy/colonoscopy on 12/23/22, she has experienced daily lower abdominal cramping intermittently with occasional sharp pain to the left. She states that she now has normal, formed bowel movements (since taking creon) and has not needed viberzi. She denies any black stool or other abdominal/chest pains. Denies any dysuria or fever. Denies any flank pain.  Please advise.

## 2023-01-06 NOTE — Telephone Encounter (Signed)
Patient requesting f/u call to speak with a nurse in regards to medication management.

## 2023-01-07 MED ORDER — DICYCLOMINE HCL 10 MG PO CAPS: 10.0000 mg | ORAL_CAPSULE | Freq: Four times a day (QID) | ORAL | 1 refills | Status: AC | PRN

## 2023-01-07 NOTE — Telephone Encounter (Signed)
Patient advised that we will sent dicyclomine 10 mg for her to take every 6 hours as needed for abdominal cramping. She verbalizes understanding. Rx sent to [facility].

## 2023-01-24 ENCOUNTER — Ambulatory Visit (HOSPITAL_COMMUNITY): Payer: Medicaid Other | Admitting: Mental Health

## 2023-01-24 DIAGNOSIS — F410 Panic disorder [episodic paroxysmal anxiety] without agoraphobia: Secondary | ICD-10-CM

## 2023-01-24 NOTE — Progress Notes (Signed)
 Therapist connected to tele-therapy session. Pt stated to be getting ready for work and not able to hold session. Shares to be stable and medication compliant. Notes to be busy with work and has been able to maintain her ability to reside at her home. Shares schedule with therapist and ends session. No safety concerns reported.   Bernice Rao [facility]  8:05 am (8 minute contact )

## 2023-01-28 ENCOUNTER — Ambulatory Visit (HOSPITAL_COMMUNITY): Payer: Medicaid Other | Admitting: Mental Health

## 2023-01-28 DIAGNOSIS — F411 Generalized anxiety disorder: Secondary | ICD-10-CM | POA: Diagnosis not present

## 2023-01-28 DIAGNOSIS — F410 Panic disorder [episodic paroxysmal anxiety] without agoraphobia: Secondary | ICD-10-CM | POA: Diagnosis not present

## 2023-01-28 NOTE — Progress Notes (Signed)
 THERAPIST PROGRESS NOTE Virtual Visit via Video Note  I connected with Erin Pollard on 01/28/23 at 11:00 AM EST by a video enabled telemedicine application and verified that I am speaking with the correct person using two identifiers.  Location: Patient: home address on file Provider: home office   I discussed the limitations of evaluation and management by telemedicine and the availability of in person appointments. The patient expressed understanding and agreed to proceed.  I discussed the assessment and treatment plan with the patient. The patient was provided an opportunity to ask questions and all were answered. The patient agreed with the plan and demonstrated an understanding of the instructions.   The patient was advised to call back or seek an in-person evaluation if the symptoms worsen or if the condition fails to improve as anticipated.  I provided 40 minutes of non-face-to-face time during this encounter.   Ty Bernice Savant, [facility]   Session Time: 11:04 am ( 40 minutes)  Participation Level: Active  Behavioral Response: CasualAlertEuthymic  Type of Therapy: Individual Therapy  Treatment Goals addressed: Erin Pollard will increase management of anxiety AEB ability to engage in use of x 3 effective coping skills for anxiety with ability to reframe maladaptive thinking patterns daily as needed within the next 90 days.    ProgressTowards Goals: Progressing  Interventions: Supportive  Summary:  Erin Pollard is a 53 y.o. female who presents with dx of generalized anxiety disorder with panic.  Erin Pollard presents to session alert and oriented; mood and affect adequate; stable.Speech clear and coherent at normal rate and tone. Shares to be doing ok but not feeling well physically noting for blood pressure to be increased, has taken medications. Shares to have worked x 10 hours previous day. Notes working to manage stressors one thing at a time. Notes stressors related to  finances, working part time and concern for son in [place]. Shares can start to over think and then anxiety will get out of control. Shares to enjoy working and this to be beneficial to her mental health with ability to get out of the house and enjoys being around her co-workers. Notes to be exploring additional jobs for full time employment and increased income. Shares working to be mindful of thoughts and manage anxiety. Progress with goals; sxs stable.   Suicidal/Homicidal: Nowithout intent/plan  Therapist Response: Therapist engaged Erin Pollard in tele-therapy session. Assessed for current location and ability to hold confidential session. Assessed for current level of functioning sxs management and sxs and level of stressors. Assessed for sxs of depression and anxiety and ability to cope. Active empathic listening, offering support and encouragement. Explored current concerns and benefits of engaging in work. Encouraged ongoing exploration of additional work and maintaining thinking in balanced ways and not overwhelming self with things she can not control. Reviewed session provided follow up. No safety concerns reported.   Plan: Return again in  x 4 weeks.  Diagnosis: Generalized anxiety disorder with panic attacks  Collaboration of Care: Other None  Patient/Guardian was advised Release of Information must be obtained prior to any record release in order to collaborate their care with an outside provider. Patient/Guardian was advised if they have not already done so to contact the registration department to sign all necessary forms in order for us  to release information regarding their care.   Consent: Patient/Guardian gives verbal consent for treatment and assignment of benefits for services provided during this visit. Patient/Guardian expressed understanding and agreed to proceed.   Ty Bernice  Rojean, [facility] 01/28/2023

## 2023-02-01 ENCOUNTER — Encounter (HOSPITAL_COMMUNITY): Payer: Self-pay

## 2023-02-01 ENCOUNTER — Other Ambulatory Visit: Payer: Self-pay

## 2023-02-01 ENCOUNTER — Emergency Department (HOSPITAL_COMMUNITY)
Admission: EM | Admit: 2023-02-01 | Discharge: 2023-02-01 | Disposition: A | Discharge status: Home / Self Care | Payer: Medicaid Other | Attending: Emergency Medicine | Admitting: Emergency Medicine

## 2023-02-01 DIAGNOSIS — Z760 Encounter for issue of repeat prescription: Secondary | ICD-10-CM | POA: Insufficient documentation

## 2023-02-01 MED ORDER — CLONAZEPAM 0.5 MG PO TABS
0.5000 mg | ORAL_TABLET | Freq: Once | ORAL | Status: AC
  Administered 2023-02-01: 0.5 mg via ORAL
  Filled 2023-02-01: qty 1

## 2023-02-01 MED ORDER — CLONAZEPAM 0.5 MG PO TABS: 0.5000 mg | ORAL_TABLET | Freq: Three times a day (TID) | ORAL | 0 refills | Status: DC

## 2023-02-01 NOTE — Discharge Instructions (Signed)
 Please follow-up with your regular pharmacy on Monday and pick up your typical prescription.  I provided you with 5 tablets, to make sure that you have some this weekend, so you do not have withdrawal from your medication.

## 2023-02-01 NOTE — ED Triage Notes (Signed)
 Pt states that her pharmacy is closed and she needs her Clonazepam. Pt would like her prescription sent to [facility] on [address].

## 2023-02-01 NOTE — ED Provider Notes (Signed)
 [place] [facility]MENT AT [facility] Provider Note   CSN: [id number] Arrival date & time: 02/01/23  1828     History  Chief Complaint  Patient presents with   Medication Refill    Erin Pollard is a 53 y.o. female, history anxiety, who presents to the ED secondary to a need for refill of her clonazepam .  She is established with a psychiatrist, and her refill was due today.  She was going to pick it up last night, and went to go pick it up, but [facility] closed earlier than usual, because of this note.  She states since then, she has been out of her clonazepam , and she is becoming more anxious because she is out of it.  She has called the pharmacy and attempted to get her refills resent to another pharmacy, but they will not send it as it is a controlled substance.She has a also spoken to the pharmacy manager, without additional help.  She takes clonazepam  3 times daily.     Home Medications Prior to Admission medications   Medication Sig Start Date End Date Taking? Authorizing Provider  clonazePAM  (KLONOPIN ) 0.5 MG tablet Take 1 tablet (0.5 mg total) by mouth in the morning, at noon, and at bedtime. 02/01/23  Yes Mell Guia L, PA  atorvastatin  (LIPITOR) 40 MG tablet Take 1 tablet (40 mg total) by mouth at bedtime. 10/08/22 04/06/23  Tolia, Sunit, DO  Bempedoic Acid-Ezetimibe  (NEXLIZET ) 180-10 MG TABS Take 1 tablet by mouth daily. Patient not taking: Reported on 12/23/2022 10/08/22 04/06/23  Tolia, Sunit, DO  cholecalciferol (VITAMIN D3) 25 MCG (1000 UNIT) tablet Take 1,000 Units by mouth daily.    [provider]  clonazePAM  (KLONOPIN ) 0.5 MG tablet Take 1 tablet (0.5 mg total) by mouth 3 (three) times daily. 12/31/22 03/31/23  Bahraini, Sarah A  dicyclomine  (BENTYL ) 10 MG capsule Take 1 capsule (10 mg total) by mouth every 6 (six) hours as needed for spasms (cramps). 01/07/23   Cirigliano, Vito V, DO  diphenhydrAMINE  (BENADRYL  ALLERGY) 25 MG tablet Take 2  tablets (50 mg total) by mouth as directed. 1 hour prior to your CT. 10/21/22   Cirigliano, Sandor GAILS, DO  Eluxadoline  (VIBERZI ) 100 MG TABS Take 1 tablet (100 mg total) by mouth 2 (two) times daily before a meal. Patient not taking: Reported on 12/23/2022 10/08/22   Cirigliano, Vito V, DO  famotidine  (PEPCID ) 20 MG tablet Take 20 mg by mouth as needed. Patient not taking: Reported on 12/23/2022    [provider]  ferrous sulfate  325 (65 FE) MG EC tablet TAKE 1 TABLET BY MOUTH EVERY DAY 02/18/22   Celestia Rosaline SQUIBB, NP  hydrALAZINE  (APRESOLINE ) 25 MG tablet Take 1 tablet (25 mg total) by mouth 3 (three) times daily. 10/08/22   Tolia, Sunit, DO  hydrochlorothiazide  (MICROZIDE ) 12.5 MG capsule Take 1 capsule (12.5 mg total) by mouth daily. 10/08/22 04/06/23  Tolia, Sunit, DO  KLOR-CON  M10 10 MEQ tablet Take 10 mEq by mouth daily. 08/31/21   [provider]  labetalol  (NORMODYNE ) 100 MG tablet Take 1 tablet (100 mg total) by mouth 2 (two) times daily. 10/08/22 01/06/23  Tolia, Sunit, DO  lipase/protease/amylase (CREON ) 36000 UNITS CPEP capsule Take 2 capsules (72,000 Units total) by mouth 3 (three) times daily with meals. May also take 1 capsule (36,000 Units total) as needed (with snacks - up to 2 snacks daily). 11/27/22   Cirigliano, Vito V, DO  loratadine  (CLARITIN ) 10 MG tablet Take 1  tablet (10 mg total) by mouth daily. 11/02/21   Celestia Rosaline SQUIBB, NP  losartan  (COZAAR ) 50 MG tablet Take 1 tablet (50 mg total) by mouth daily. 10/08/22 04/06/23  Tolia, Sunit, DO  methocarbamol  (ROBAXIN ) 500 MG tablet Take 1 tablet (500 mg total) by mouth 2 (two) times daily. 07/29/21   Enedelia Dorna HERO, FNP  mirtazapine  (REMERON ) 30 MG tablet Take 1 tablet (30 mg total) by mouth at bedtime. 12/31/22 03/31/23  Bahraini, Sarah A  naproxen  (NAPROSYN ) 500 MG tablet Take 1 tablet (500 mg total) by mouth 2 (two) times daily. 09/11/21   Tolia, Sunit, DO  Omega-3 Fatty Acids (FISH OIL) 1000 MG CAPS Take 1 capsule  by mouth daily.    [provider]  omeprazole  (PRILOSEC) 40 MG capsule Omeprazole  40 mg by mouth twice a day for 4 weeks ,then back to 40 mg once a day if that controls reflux symptoms 12/23/22   Cirigliano, Vito V, DO  ondansetron  (ZOFRAN -ODT) 4 MG disintegrating tablet Take 1 tablet (4 mg total) by mouth every 8 (eight) hours as needed for nausea or vomiting. 07/29/21   Enedelia Dorna HERO, FNP  oxymetazoline  (AFRIN NASAL SPRAY) 0.05 % nasal spray Place 1 spray into both nostrils 2 (two) times daily. 11/02/21   Celestia Rosaline SQUIBB, NP  predniSONE  (DELTASONE ) 50 MG tablet Take 1 tablet by mouth 13 hours prior to CT. Take 1 tablet by mouth 7 hours prior to CT. Take 1 tablet by mouth 1 hour prior to CT. 10/21/22   Cirigliano, Vito V, DO  sucralfate  (CARAFATE ) 1 g tablet Take 1 tablet (1 g total) by mouth 4 (four) times daily -  with meals and at bedtime. 04/20/21   Vicky Charleston, PA-C      Allergies    Amlodipine, Hydrocodone-acetaminophen , Iodinated contrast media, Iodine, Codeine, Hydrocortisone-iodoquinol, and Propoxyphene    Review of Systems   Review of Systems  Respiratory:  Negative for shortness of breath.   Psychiatric/Behavioral:  The patient is nervous/anxious.     Physical Exam Updated Vital Signs BP (!) 161/98 (BP Location: Right Arm)   Pulse 88   Temp 98.1 F (36.7 C) (Oral)   Resp 18   Ht 5' (1.524 m)   Wt 73.3 kg   SpO2 98%   BMI 31.56 kg/m  Physical Exam Vitals and nursing note reviewed.  Constitutional:      General: She is not in acute distress.    Appearance: She is well-developed.  HENT:     Head: Normocephalic and atraumatic.  Eyes:     Conjunctiva/sclera: Conjunctivae normal.  Cardiovascular:     Rate and Rhythm: Normal rate and regular rhythm.     Heart sounds: No murmur heard. Pulmonary:     Effort: Pulmonary effort is normal. No respiratory distress.     Breath sounds: Normal breath sounds.  Abdominal:     Palpations: Abdomen is soft.      Tenderness: There is no abdominal tenderness.  Musculoskeletal:        General: No swelling.     Cervical back: Neck supple.  Skin:    General: Skin is warm and dry.     Capillary Refill: Capillary refill takes less than 2 seconds.  Neurological:     Mental Status: She is alert.  Psychiatric:        Mood and Affect: Mood normal.     ED Results / Procedures / Treatments   Labs (all labs ordered are listed, but only abnormal results  are displayed) Labs Reviewed - No data to display  EKG None  Radiology No results found.  Procedures Procedures    Medications Ordered in ED Medications  clonazePAM  (KLONOPIN ) tablet 0.5 mg (has no administration in time range)    ED Course/ Medical Decision Making/ A&P                                 Medical Decision Making Patient is a well-appearing 53 year old female, here for refill of her clonazepam .  I reviewed her prescription log, and she has 2 refills, to last her until March.  She was unable to get her prescription last night, because the [facility] closed early because of the snow.  And it they are not open on Saturday or "Sunday.  She states she will not be able to present pick up her prescription until Monday and she is attempted to call the pharmacy to get them switched over to [facility], but they will not send it because it is a controlled substance.  I will give her 1 dose here, and I give her 5 tablets so that she can have some for tomorrow, and then Monday until she is able to pick up her prescription after work on Monday.  We discussed return precautions and she voiced understanding.  Risk Prescription drug management.   Final Clinical Impression(s) / ED Diagnoses Final diagnoses:  Medication refill    Rx / DC Orders ED Discharge Orders          Ordered    clonazePAM (KLONOPIN) 0.5 MG tablet  3 times daily        01" /11/25 1900              Flint Hakeem, Lyle CROME, [place] 02/01/23 1904    Garrick Charleston, MD 02/01/23 1945

## 2023-02-14 ENCOUNTER — Other Ambulatory Visit: Payer: Self-pay

## 2023-02-14 ENCOUNTER — Ambulatory Visit
Admission: RE | Admit: 2023-02-14 | Discharge: 2023-02-14 | Disposition: A | Discharge status: Home / Self Care | Payer: Medicaid Other | Source: Ambulatory Visit | Attending: Physician Assistant

## 2023-02-14 ENCOUNTER — Ambulatory Visit (INDEPENDENT_AMBULATORY_CARE_PROVIDER_SITE_OTHER): Payer: Self-pay | Admitting: Primary Care

## 2023-02-14 VITALS — BP 146/84 | HR 70 | Temp 98.8°F | Resp 17

## 2023-02-14 DIAGNOSIS — R112 Nausea with vomiting, unspecified: Secondary | ICD-10-CM | POA: Diagnosis not present

## 2023-02-14 LAB — POCT URINALYSIS DIP (MANUAL ENTRY)
Bilirubin, UA: NEGATIVE
Blood, UA: NEGATIVE
Glucose, UA: NEGATIVE mg/dL
Ketones, POC UA: NEGATIVE mg/dL
Leukocytes, UA: NEGATIVE
Nitrite, UA: NEGATIVE
Protein Ur, POC: 30 mg/dL — AB
Spec Grav, UA: >=1.03 — AB (ref 1.010–1.025)
Urobilinogen, UA: 0.2 U/dL
pH, UA: 6 (ref 5.0–8.0)

## 2023-02-14 MED ORDER — ONDANSETRON 4 MG PO TBDP: 4.0000 mg | ORAL_TABLET | Freq: Three times a day (TID) | ORAL | 0 refills | Status: DC | PRN

## 2023-02-14 MED ORDER — ONDANSETRON 4 MG PO TBDP
4.0000 mg | ORAL_TABLET | Freq: Once | ORAL | Status: AC
  Administered 2023-02-14: 4 mg via ORAL

## 2023-02-14 NOTE — Discharge Instructions (Addendum)
Follow up with PCP Return if you have new or worsening symptoms

## 2023-02-14 NOTE — ED Triage Notes (Signed)
Pt is here with feeling nausea that started this morning with chills. Pt has not taken any meds to relieve discomfort.

## 2023-02-14 NOTE — ED Provider Notes (Signed)
[facility]    CSN: [id number] Arrival date & time: 02/14/23  1541      History   Chief Complaint Chief Complaint  Patient presents with   Nausea    HPI Erin Pollard is a 53 y.o. female.   Patient here concerned with nausea x this morning, woke up nauseated.  No vomiting.  She reports low back pain, but she's not sure if that's related or if it's due to her standing at work for long periods of time.  Denies f/c, URI sx, cough, GU sx.      Past Medical History:  Diagnosis Date   Anxiety    Cervical radiculopathy    GERD (gastroesophageal reflux disease)    Hiatal hernia 08/28/2022   [facility] was found through a CT scan and fibroids and cyst on liver parenchyma   Hypertension    Myocardial infarction [facility])    Panic attack     Patient Active Problem List   Diagnosis Date Noted   Generalized anxiety disorder with panic attacks 06/27/2021    Past Surgical History:  Procedure Laterality Date   COLONOSCOPY     LEFT HEART CATH     TUBAL LIGATION     UPPER GASTROINTESTINAL ENDOSCOPY      OB History   No obstetric history on file.      Home Medications    Prior to Admission medications   Medication Sig Start Date End Date Taking? Authorizing Provider  ondansetron (ZOFRAN-ODT) 4 MG disintegrating tablet Take 1 tablet (4 mg total) by mouth every 8 (eight) hours as needed for nausea or vomiting. 02/14/23  Yes Evern Core, PA-C  atorvastatin (LIPITOR) 40 MG tablet Take 1 tablet (40 mg total) by mouth at bedtime. 10/08/22 04/06/23  Tolia, Sunit, DO  Bempedoic Acid-Ezetimibe (NEXLIZET) 180-10 MG TABS Take 1 tablet by mouth daily. Patient not taking: Reported on 12/23/2022 10/08/22 04/06/23  Tessa Lerner, DO  cholecalciferol (VITAMIN D3) 25 MCG (1000 UNIT) tablet Take 1,000 Units by mouth daily.    [provider]  clonazePAM (KLONOPIN) 0.5 MG tablet Take 1 tablet (0.5 mg total) by mouth 3 (three) times daily. 12/31/22 03/31/23   Bahraini, Sarah A  clonazePAM (KLONOPIN) 0.5 MG tablet Take 1 tablet (0.5 mg total) by mouth in the morning, at noon, and at bedtime. 02/01/23   Small, Brooke L, PA  dicyclomine (BENTYL) 10 MG capsule Take 1 capsule (10 mg total) by mouth every 6 (six) hours as needed for spasms (cramps). 01/07/23   Cirigliano, Vito V, DO  diphenhydrAMINE (BENADRYL ALLERGY) 25 MG tablet Take 2 tablets (50 mg total) by mouth as directed. 1 hour prior to your CT. 10/21/22   Cirigliano, Vito V, DO  Eluxadoline (VIBERZI) 100 MG TABS Take 1 tablet (100 mg total) by mouth 2 (two) times daily before a meal. Patient not taking: Reported on 12/23/2022 10/08/22   Cirigliano, Vito V, DO  famotidine (PEPCID) 20 MG tablet Take 20 mg by mouth as needed. Patient not taking: Reported on 12/23/2022    [provider]  ferrous sulfate 325 (65 FE) MG EC tablet TAKE 1 TABLET BY MOUTH EVERY DAY 02/18/22   Grayce Sessions, NP  hydrALAZINE (APRESOLINE) 25 MG tablet Take 1 tablet (25 mg total) by mouth 3 (three) times daily. 10/08/22   Tolia, Sunit, DO  hydrochlorothiazide (MICROZIDE) 12.5 MG capsule Take 1 capsule (12.5 mg total) by mouth daily. 10/08/22 04/06/23  Tolia, Sunit, DO  KLOR-CON M10 10  MEQ tablet Take 10 mEq by mouth daily. 08/31/21   [provider]  labetalol (NORMODYNE) 100 MG tablet Take 1 tablet (100 mg total) by mouth 2 (two) times daily. 10/08/22 01/06/23  Tolia, Sunit, DO  lipase/protease/amylase (CREON) 36000 UNITS CPEP capsule Take 2 capsules (72,000 Units total) by mouth 3 (three) times daily with meals. May also take 1 capsule (36,000 Units total) as needed (with snacks - up to 2 snacks daily). 11/27/22   Cirigliano, Vito V, DO  loratadine (CLARITIN) 10 MG tablet Take 1 tablet (10 mg total) by mouth daily. 11/02/21   Grayce Sessions, NP  losartan (COZAAR) 50 MG tablet Take 1 tablet (50 mg total) by mouth daily. 10/08/22 04/06/23  Tolia, Sunit, DO  methocarbamol (ROBAXIN) 500 MG tablet Take 1 tablet (500  mg total) by mouth 2 (two) times daily. 07/29/21   Carlisle Beers, FNP  mirtazapine (REMERON) 30 MG tablet Take 1 tablet (30 mg total) by mouth at bedtime. 12/31/22 03/31/23  Bahraini, Sarah A  naproxen (NAPROSYN) 500 MG tablet Take 1 tablet (500 mg total) by mouth 2 (two) times daily. 09/11/21   Tolia, Sunit, DO  Omega-3 Fatty Acids (FISH OIL) 1000 MG CAPS Take 1 capsule by mouth daily.    [provider]  omeprazole (PRILOSEC) 40 MG capsule Omeprazole 40 mg by mouth twice a day for 4 weeks ,then back to 40 mg once a day if that controls reflux symptoms 12/23/22   Cirigliano, Vito V, DO  ondansetron (ZOFRAN-ODT) 4 MG disintegrating tablet Take 1 tablet (4 mg total) by mouth every 8 (eight) hours as needed for nausea or vomiting. 07/29/21   Carlisle Beers, FNP  oxymetazoline (AFRIN NASAL SPRAY) 0.05 % nasal spray Place 1 spray into both nostrils 2 (two) times daily. 11/02/21   Grayce Sessions, NP  predniSONE (DELTASONE) 50 MG tablet Take 1 tablet by mouth 13 hours prior to CT. Take 1 tablet by mouth 7 hours prior to CT. Take 1 tablet by mouth 1 hour prior to CT. 10/21/22   Cirigliano, Vito V, DO  sucralfate (CARAFATE) 1 g tablet Take 1 tablet (1 g total) by mouth 4 (four) times daily -  with meals and at bedtime. 04/20/21   Roxy Horseman, PA-C    Family History Family History  Problem Relation Age of Onset   Heart attack Mother    Cancer Father    Stroke Father    Stroke Sister    Heart disease Maternal Grandmother    Heart attack Maternal Grandmother    Heart disease Maternal Grandfather    Stroke Maternal Grandfather    Esophageal cancer Neg Hx    Colon cancer Neg Hx    Stomach cancer Neg Hx     Social History Social History   Tobacco Use   Smoking status: Former    Current packs/day: 0.00    Types: Cigarettes    Quit date: 2017    Years since quitting: 8.0  Vaping Use   Vaping status: Never Used  Substance Use Topics   Alcohol use: Not Currently   Drug  use: Never     Allergies   Amlodipine, Hydrocodone-acetaminophen, Iodinated contrast media, Iodine, Codeine, Hydrocortisone-iodoquinol, and Propoxyphene   Review of Systems Review of Systems  Constitutional:  Negative for chills, fatigue and fever.  HENT:  Negative for congestion, ear pain, nosebleeds, postnasal drip, rhinorrhea, sinus pressure, sinus pain and sore throat.   Eyes:  Negative for pain and redness.  Respiratory:  Negative for cough, shortness of breath and wheezing.   Gastrointestinal:  Positive for nausea. Negative for abdominal pain, blood in stool, constipation, diarrhea and vomiting.  Genitourinary:  Negative for dysuria, frequency, hematuria, urgency, vaginal bleeding, vaginal discharge and vaginal pain.  Musculoskeletal:  Positive for back pain. Negative for arthralgias and myalgias.  Skin:  Negative for rash.  Neurological:  Negative for light-headedness and headaches.  Hematological:  Negative for adenopathy. Does not bruise/bleed easily.  Psychiatric/Behavioral:  Negative for confusion and sleep disturbance.      Physical Exam Triage Vital Signs ED Triage Vitals  Encounter Vitals Group     BP 02/14/23 1612 (!) 146/84     Systolic BP Percentile --      Diastolic BP Percentile --      Pulse Rate 02/14/23 1612 70     Resp 02/14/23 1612 17     Temp 02/14/23 1612 98.8 F (37.1 C)     Temp Source 02/14/23 1612 Oral     SpO2 02/14/23 1612 97 %     Weight --      Height --      Head Circumference --      Peak Flow --      Pain Score 02/14/23 1610 0     Pain Loc --      Pain Education --      Exclude from Growth Chart --    No data found.  Updated Vital Signs BP (!) 146/84 (BP Location: Right Arm)   Pulse 70   Temp 98.8 F (37.1 C) (Oral)   Resp 17   SpO2 97%   Visual Acuity Right Eye Distance:   Left Eye Distance:   Bilateral Distance:    Right Eye Near:   Left Eye Near:    Bilateral Near:     Physical Exam Vitals and nursing note  reviewed.  Constitutional:      General: She is not in acute distress.    Appearance: Normal appearance. She is not ill-appearing.  HENT:     Head: Normocephalic and atraumatic.     Nose: No congestion or rhinorrhea.  Eyes:     General: No scleral icterus.    Extraocular Movements: Extraocular movements intact.     Conjunctiva/sclera: Conjunctivae normal.  Pulmonary:     Effort: Pulmonary effort is normal. No respiratory distress.  Abdominal:     Tenderness: There is abdominal tenderness in the suprapubic area. There is no right CVA tenderness, left CVA tenderness, guarding or rebound. Negative signs include Murphy's sign, Rovsing's sign and McBurney's sign.  Musculoskeletal:     Cervical back: Normal range of motion. No rigidity.  Skin:    Coloration: Skin is not jaundiced.     Findings: No rash.  Neurological:     General: No focal deficit present.     Mental Status: She is alert and oriented to person, place, and time.     Motor: No weakness.     Gait: Gait normal.  Psychiatric:        Mood and Affect: Mood normal.        Behavior: Behavior normal.      UC Treatments / Results  Labs (all labs ordered are listed, but only abnormal results are displayed) Labs Reviewed  POCT URINALYSIS DIP (MANUAL ENTRY) - Abnormal; Notable for the following components:      Result Value   Color, UA straw (*)    Clarity, UA hazy (*)    Spec Grav, UA >=  1.030 (*)    Protein Ur, POC =30 (*)    All other components within normal limits    EKG   Radiology No results found.  Procedures Procedures (including critical care time)  Medications Ordered in UC Medications  ondansetron (ZOFRAN-ODT) disintegrating tablet 4 mg (4 mg Oral Given 02/14/23 1626)    Initial Impression / Assessment and Plan / UC Course  I have reviewed the triage vital signs and the nursing notes.  Pertinent labs & imaging results that were available during my care of the patient were reviewed by me and  considered in my medical decision making (see chart for details).     UA largely unremarkable Nausea non-specific No other sx Follow up with PCP Return precautions provided Patient in NAD Abdomen non-acute, no RLQ tenderness, appendicitis unlikely Final Clinical Impressions(s) / UC Diagnoses   Final diagnoses:  Nausea and vomiting, unspecified vomiting type     Discharge Instructions      Follow up with PCP Return if you have new or worsening symptoms     ED Prescriptions     Medication Sig Dispense Auth. Provider   ondansetron (ZOFRAN-ODT) 4 MG disintegrating tablet Take 1 tablet (4 mg total) by mouth every 8 (eight) hours as needed for nausea or vomiting. 12 tablet Evern Core, PA-C      PDMP not reviewed this encounter.   Evern Core, PA-C 02/14/23 1703

## 2023-02-14 NOTE — Telephone Encounter (Signed)
Copied from CRM [id number]. Topic: Clinical - Red Word Triage >> Feb 14, 2023 12:12 PM Phill Myron wrote: Red Word that prompted transfer to Nurse Triage:  diarrhea, vomiting, chills.started this morning  Chief Complaint: Nausea Symptoms: nausea, diarrhea, chills, abd and back pain Pertinent Negatives: Patient denies vomiting Disposition: [] ED /[x] Urgent Care (no appt availability in office) / [] Appointment(In office/virtual)/ []   Virtual Care/ [] Home Care/ [] Refused Recommended Disposition /[]  Mobile Bus/ []  Follow-up with PCP  Additional Notes: Patient stated that she is experiencing nausea, abdominal pain, and back pain today. Symptoms started this morning. She described her pain as both sharp and dull. The back pain is constant. The abd pain comes and goes and feels like period pain, but she does not get periods. She also reported having chills and she had an episode of diarrhea today. No available appointments at regular office location until Jan. 20. Offered appt today at different location. Patient declined because it is too far. Recommended urgent care. Patient agreed and stated she will go to urgent care.    Reason for Disposition  [1] MILD-MODERATE pain AND [2] constant AND [3] present > 2 hours  Answer Assessment - Initial Assessment Questions 1. NAUSEA SEVERITY: "How bad is the nausea?" (e.g., mild, moderate, severe; dehydration, weight loss)   - MILD: loss of appetite without change in eating habits   - MODERATE: decreased oral intake without significant weight loss, dehydration, or malnutrition   - SEVERE: inadequate caloric or fluid intake, significant weight loss, symptoms of dehydration     Nausea all morning, no vomiting  2. ONSET: "When did the nausea begin?"     Today  3. VOMITING: "Any vomiting?" If Yes, ask: "How many times today?"     No  4. CAUSE: "What do you think is causing the nausea?"     Not sure, maybe gastritis or  diverticulitis  Answer Assessment - Initial Assessment Questions 1. LOCATION: "Where does it hurt?"      Whole stomach area  2. RADIATION: "Does the pain shoot anywhere else?" (e.g., chest, back)     Back  3. ONSET: "When did the pain begin?" (e.g., minutes, hours or days ago)      This morning  4. PATTERN "Does the pain come and go, or is it constant?"    - If it comes and goes: "How long does it last?" "Do you have pain now?"     (Note: Comes and goes means the pain is intermittent. It goes away completely between bouts.)    - If constant: "Is it getting better, staying the same, or getting worse?"      (Note: Constant means the pain never goes away completely; most serious pain is constant and gets worse.)      Stomach pain comes and goes, back pain is constant  5. SEVERITY: "How bad is the pain?"  (e.g., Scale 1-10; mild, moderate, or severe)    - MILD (1-3): Doesn't interfere with normal activities, abdomen soft and not tender to touch.     - MODERATE (4-7): Interferes with normal activities or awakens from sleep, abdomen tender to touch.     - SEVERE (8-10): Excruciating pain, doubled over, unable to do any normal activities.       3 or 4 out of 10 for abdominal pain. 5/10 for back pain  6. OTHER SYMPTOMS: "Do you have any other symptoms?" (e.g., back pain, diarrhea, fever, urination pain, vomiting)      Back pain  and one episode of diarrhea this morning  Protocols used: Nausea-A-AH, Abdominal Pain - Female-A-AH

## 2023-02-17 NOTE — Telephone Encounter (Addendum)
Noted. Patient seen in ED.

## 2023-02-21 NOTE — Progress Notes (Unsigned)
BH MD Outpatient Progress Note  02/24/2023 8:16 PM Erin Pollard  MRN:  [id number]  Assessment:  Erin Pollard presents for follow-up evaluation. Today, 02/24/23, patient shares progress she has made in regards to obtaining employment, maintaining housing, and spending more time with grandchildren although identifies anxiety and recent decline in in mood surrounding ongoing stressors in particular financial instability. Empathic listening and validation provided and explored how progress is not always linear while reminding patient of successes thus far. While she endorses ongoing anxiety, she feels this is normal response given current stressors and she declines changes to regimen at this time. She remains engaged in psychotherapy.  RTC in approx. 2 months by video.  Identifying Information: Erin Pollard is a 53 y.o. female with a history of generalized anxiety disorder with panic attacks, HTN, past MI (2017), HLD, and IBS who is an established patient with [facility] participating in follow-up via video conferencing.   Plan:  # GAD with panic attacks Past medication trials: Zoloft, Valium (nausea), patient reports "numerous others" that requires further exploration Status of problem: chronic with mild exacerbation Interventions: -- Continue mirtazapine 30 mg qHS -- Continue Klonopin 0.5 mg TID PRN panic attacks (patient using TID)  -- Risks of long-term use have been extensively discussed and patient expressed understanding and desire to continue as she finds it effective with absence of side effects  -- PDMP reviewed with appropriate filling -- Could consider Cymbalta in the future given vasomotor symptoms of perimenopause -- Continue individual psychotherapy with [name], [facility]  Patient was given contact information for behavioral health clinic and was instructed to call 911 for emergencies.   Subjective:  Chief Complaint:  Chief Complaint   Patient presents with   Medication Management    Interval History:   Patient reports she is working at [employer]. States hours at work have decreased due to slower season and feeling worried about finances again. Has gotten positive feedback at work and enjoys those she works with. Will likely need to look into another job. Becomes tearful in describing how she feels she has lost progress previously made.   Reports that for the last few days she has been feeling more low, wanting to be left alone - this has been since she received her check and it was lower than she had anticipated. Prior to this, felt mood was "great" and energy/motivation was intact. However, anxiety in general has elevated due to ongoing stressors.  Sleeping well at night especially from being on her feet all day. Has gotten to spend time with grandkids and enjoys time with them. Reports some weight loss due to increased activity which she feels has been a positive thing.  Continues to find medication helpful and does not feel that change in medication would provide any additional relief from anxiety. Would like to avoid adding another medication for time being. Reports when she is feeling anxious, she has been able to talk herself through it. Amenable to continuing medication as prescribed.   PDMP: -- Klonopin 0.5 mg QTY 90 last filled 02/03/23 (rx dating back to 2021)  Visit Diagnosis:    ICD-10-CM   1. Generalized anxiety disorder with panic attacks  F41.1 clonazePAM (KLONOPIN) 0.5 MG tablet   F41.0 mirtazapine (REMERON) 30 MG tablet      Past Psychiatric History:  Diagnoses: GAD with panic attacks Medication trials: Zoloft, Valium (nausea), patient reports "numerous others" that requires further exploration Hospitalizations: x1 in 1996 for suicide attempt  Suicide attempts: x1 in 1997 via cutting Substance use: denies use of etoh, tobacco, or illicit drugs  -- Tobacco: quit smoking 2017  Past Medical History:   Past Medical History:  Diagnosis Date   Anxiety    Cervical radiculopathy    GERD (gastroesophageal reflux disease)    Hiatal hernia 08/28/2022   [facility] was found through a CT scan and fibroids and cyst on liver parenchyma   Hypertension    Myocardial infarction (HCC)    Panic attack     Past Surgical History:  Procedure Laterality Date   COLONOSCOPY     LEFT HEART CATH     TUBAL LIGATION     UPPER GASTROINTESTINAL ENDOSCOPY      Family Psychiatric History:  Reports 2 of her sons have struggled with substance use  Family History:  Family History  Problem Relation Age of Onset   Heart attack Mother    Cancer Father    Stroke Father    Stroke Sister    Heart disease Maternal Grandmother    Heart attack Maternal Grandmother    Heart disease Maternal Grandfather    Stroke Maternal Grandfather    Esophageal cancer Neg Hx    Colon cancer Neg Hx    Stomach cancer Neg Hx     Social History:  Social History   Socioeconomic History   Marital status: Divorced    Spouse name: Not on file   Number of children: 4   Years of education: Not on file   Highest education level: Associate degree: occupational, Scientist, product/process development, or vocational program  Occupational History   Not on file  Tobacco Use   Smoking status: Former    Current packs/day: 0.00    Types: Cigarettes    Quit date: 2017    Years since quitting: 8.0   Smokeless tobacco: Not on file  Vaping Use   Vaping status: Never Used  Substance and Sexual Activity   Alcohol use: Not Currently   Drug use: Never   Sexual activity: Yes  Other Topics Concern   Not on file  Social History Narrative   Not on file   Social Drivers of Health   Financial Resource Strain: Medium Risk (07/23/2022)   Overall Financial Resource Strain (CARDIA)    Difficulty of Paying Living Expenses: Somewhat hard  Food Insecurity: Food Insecurity Present (07/23/2022)   Hunger Vital Sign    Worried About Running Out of Food  in the Last Year: Sometimes true    Ran Out of Food in the Last Year: Sometimes true  Transportation Needs: No Transportation Needs (07/23/2022)   PRAPARE - Administrator, Civil Service (Medical): No    Lack of Transportation (Non-Medical): No  Physical Activity: Sufficiently Active (07/23/2022)   Exercise Vital Sign    Days of Exercise per Week: 5 days    Minutes of Exercise per Session: 30 min  Stress: No Stress Concern Present (07/23/2022)   [employer] of Occupational Health - Occupational Stress Questionnaire    Feeling of Stress : Only a little  Social Connections: Moderately Isolated (07/23/2022)   Social Connection and Isolation Panel [NHANES]    Frequency of Communication with Friends and Family: More than three times a week    Frequency of Social Gatherings with Friends and Family: More than three times a week    Attends Religious Services: 1 to 4 times per year    Active Member of Clubs or Organizations: No  Attends Banker Meetings: Not on file    Marital Status: Divorced    Allergies:  Allergies  Allergen Reactions   Amlodipine Hives    Achy legs and s.o.b Achy legs and s.o.b    Hydrocodone-Acetaminophen Hives and Rash   Iodinated Contrast Media Hives   Iodine Hives and Rash   Codeine    Hydrocortisone-Iodoquinol    Propoxyphene     Current Medications: Current Outpatient Medications  Medication Sig Dispense Refill   atorvastatin (LIPITOR) 40 MG tablet Take 1 tablet (40 mg total) by mouth at bedtime. 90 tablet 1   Bempedoic Acid-Ezetimibe (NEXLIZET) 180-10 MG TABS Take 1 tablet by mouth daily. (Patient not taking: Reported on 12/23/2022) 90 tablet 1   cholecalciferol (VITAMIN D3) 25 MCG (1000 UNIT) tablet Take 1,000 Units by mouth daily.     [START ON 03/05/2023] clonazePAM (KLONOPIN) 0.5 MG tablet Take 1 tablet (0.5 mg total) by mouth 3 (three) times daily. 90 tablet 2   dicyclomine (BENTYL) 10 MG capsule Take 1 capsule (10 mg total)  by mouth every 6 (six) hours as needed for spasms (cramps). 70 capsule 1   diphenhydrAMINE (BENADRYL ALLERGY) 25 MG tablet Take 2 tablets (50 mg total) by mouth as directed. 1 hour prior to your CT. 30 tablet 0   Eluxadoline (VIBERZI) 100 MG TABS Take 1 tablet (100 mg total) by mouth 2 (two) times daily before a meal. (Patient not taking: Reported on 12/23/2022) 60 tablet 3   famotidine (PEPCID) 20 MG tablet Take 20 mg by mouth as needed. (Patient not taking: Reported on 12/23/2022)     ferrous sulfate 325 (65 FE) MG EC tablet TAKE 1 TABLET BY MOUTH EVERY DAY 90 tablet 0   hydrALAZINE (APRESOLINE) 25 MG tablet Take 1 tablet (25 mg total) by mouth 3 (three) times daily. 270 tablet 3   hydrochlorothiazide (MICROZIDE) 12.5 MG capsule Take 1 capsule (12.5 mg total) by mouth daily. 90 capsule 1   KLOR-CON M10 10 MEQ tablet Take 10 mEq by mouth daily.     labetalol (NORMODYNE) 100 MG tablet Take 1 tablet (100 mg total) by mouth 2 (two) times daily. 180 tablet 0   lipase/protease/amylase (CREON) 36000 UNITS CPEP capsule Take 2 capsules (72,000 Units total) by mouth 3 (three) times daily with meals. May also take 1 capsule (36,000 Units total) as needed (with snacks - up to 2 snacks daily). 300 capsule 1   loratadine (CLARITIN) 10 MG tablet Take 1 tablet (10 mg total) by mouth daily. 30 tablet 6   losartan (COZAAR) 50 MG tablet Take 1 tablet (50 mg total) by mouth daily. 90 tablet 1   methocarbamol (ROBAXIN) 500 MG tablet Take 1 tablet (500 mg total) by mouth 2 (two) times daily. 20 tablet 0   mirtazapine (REMERON) 30 MG tablet Take 1 tablet (30 mg total) by mouth at bedtime. 30 tablet 2   naproxen (NAPROSYN) 500 MG tablet Take 1 tablet (500 mg total) by mouth 2 (two) times daily. 90 tablet 1   Omega-3 Fatty Acids (FISH OIL) 1000 MG CAPS Take 1 capsule by mouth daily.     omeprazole (PRILOSEC) 40 MG capsule Omeprazole 40 mg by mouth twice a day for 4 weeks ,then back to 40 mg once a day if that controls reflux  symptoms 90 capsule 3   ondansetron (ZOFRAN-ODT) 4 MG disintegrating tablet Take 1 tablet (4 mg total) by mouth every 8 (eight) hours as needed for nausea or vomiting. 20 tablet  0   ondansetron (ZOFRAN-ODT) 4 MG disintegrating tablet Take 1 tablet (4 mg total) by mouth every 8 (eight) hours as needed for nausea or vomiting. 12 tablet 0   oxymetazoline (AFRIN NASAL SPRAY) 0.05 % nasal spray Place 1 spray into both nostrils 2 (two) times daily. 15 mL 0   predniSONE (DELTASONE) 50 MG tablet Take 1 tablet by mouth 13 hours prior to CT. Take 1 tablet by mouth 7 hours prior to CT. Take 1 tablet by mouth 1 hour prior to CT. 3 tablet 0   sucralfate (CARAFATE) 1 g tablet Take 1 tablet (1 g total) by mouth 4 (four) times daily -  with meals and at bedtime. 120 tablet 0   No current facility-administered medications for this visit.    ROS: Does not endorse any physical complaints  Objective:  Psychiatric Specialty Exam: There were no vitals taken for this visit.There is no height or weight on file to calculate BMI.  General Appearance: Casual and Well Groomed  Eye Contact:  Good  Speech:  Clear and Coherent and Normal Rate  Volume:  Normal  Mood:   "overwhelmed"  Affect:   Full range - appropriately tearful but brightens and laughs  Thought Content:  Denies AVH; IOR; paranoia    Suicidal Thoughts:  No  Homicidal Thoughts:  No  Thought Process:  Goal Directed and Linear  Orientation:  Full (Time, Place, and Person)    Memory:   Grossly intact  Judgment:  Good  Insight:  Good  Concentration:  Concentration: Good  Recall:  NA  Fund of Knowledge: Good  Language: Good  Psychomotor Activity:  Normal  Akathisia:  No  AIMS (if indicated): not done  Assets:  Communication Skills Desire for Improvement Housing Leisure Time Resilience Social Support Talents/Skills Transportation  ADL's:  Intact  Cognition: WNL  Sleep:  Good   PE: General: sits comfortably in view of camera; no acute  distress  Pulm: no increased work of breathing on room air  MSK: all extremity movements appear intact  Neuro: no focal neurological deficits observed  Gait & Station: unable to assess by video    Metabolic Disorder Labs: Lab Results  Component Value Date   HGBA1C 5.6 12/11/2021   No results found for: "PROLACTIN" Lab Results  Component Value Date   CHOL 139 07/04/2022   TRIG 62 07/04/2022   HDL 37 (L) 07/04/2022   CHOLHDL 5.0 09/14/2021   VLDL 11 09/14/2021   LDLCALC 89 07/04/2022   LDLCALC 112 (H) 11/14/2021   No results found for: "TSH"  Therapeutic Level Labs: No results found for: "LITHIUM" No results found for: "VALPROATE" No results found for: "CBMZ"  Screenings:  GAD-7    Flowsheet Row Office Visit from 12/11/2021 in [facility] Video Visit from 10/02/2021 in [facility] Office Visit from 06/27/2021 in [facility] Office Visit from 04/19/2021 in [facility] Office Visit from 01/11/2021 in [facility]  Total GAD-7 Score 10 9 8 6  0      PHQ2-9    Flowsheet Row Office Visit from 12/11/2021 in [facility] Video Visit from 10/02/2021 in [facility] Office Visit from 06/27/2021 in [facility] Office Visit from 04/19/2021 in [facility] Office Visit from 01/11/2021 in [facility]  PHQ-2 Total Score 4 1 1 1  0  PHQ-9 Total  Score 6 -- -- -- 0      Flowsheet Row ED from 02/14/2023 in [facility] at [facility] ED from 02/01/2023 in [facility] Emergency Department at [facility] ED from 08/05/2022 in [facility] Emergency Department at [facility]  C-SSRS RISK CATEGORY No Risk No Risk No Risk       Collaboration of Care: Collaboration of Care: Medication  Management AEB ongoing medication management, Psychiatrist AEB established with this provider, and Referral or follow-up with counselor/therapist AEB established with individual psychotherapy  Patient/Guardian was advised Release of Information must be obtained prior to any record release in order to collaborate their care with an outside provider. Patient/Guardian was advised if they have not already done so to contact the registration department to sign all necessary forms in order for [place] to release information regarding their care.   Consent: Patient/Guardian gives verbal consent for treatment and assignment of benefits for services provided during this visit. Patient/Guardian expressed understanding and agreed to proceed.   Televisit via video: I connected with patient on 02/24/23 at  3:30 PM EST by a video enabled telemedicine application and verified that I am speaking with the correct person using two identifiers.  Location: Patient: home address in [place] Provider: remote office in [place]   I discussed the limitations of evaluation and management by telemedicine and the availability of in person appointments. The patient expressed understanding and agreed to proceed.  I discussed the assessment and treatment plan with the patient. The patient was provided an opportunity to ask questions and all were answered. The patient agreed with the plan and demonstrated an understanding of the instructions.   The patient was advised to call back or seek an in-person evaluation if the symptoms worsen or if the condition fails to improve as anticipated.  I provided 35 minutes dedicated to the care of this patient via video on the date of this encounter to include chart review, face-to-face time with the patient, medication management/counseling, and brief therapeutic support.  Aryel Edelen A Burnetta Kohls 02/24/2023, 8:16 PM

## 2023-02-24 ENCOUNTER — Encounter (HOSPITAL_COMMUNITY): Payer: Self-pay | Admitting: Psychiatry

## 2023-02-24 ENCOUNTER — Telehealth (INDEPENDENT_AMBULATORY_CARE_PROVIDER_SITE_OTHER): Payer: Medicaid Other | Admitting: Psychiatry

## 2023-02-24 DIAGNOSIS — F410 Panic disorder [episodic paroxysmal anxiety] without agoraphobia: Secondary | ICD-10-CM | POA: Diagnosis not present

## 2023-02-24 DIAGNOSIS — F411 Generalized anxiety disorder: Secondary | ICD-10-CM | POA: Diagnosis not present

## 2023-02-24 MED ORDER — CLONAZEPAM 0.5 MG PO TABS
0.5000 mg | ORAL_TABLET | Freq: Three times a day (TID) | ORAL | 2 refills | Status: DC
Start: 2023-03-05 — End: 2023-04-28

## 2023-02-24 MED ORDER — MIRTAZAPINE 30 MG PO TABS
30.0000 mg | ORAL_TABLET | Freq: Every evening | ORAL | 2 refills | Status: DC
Start: 2023-02-24 — End: 2023-04-28

## 2023-02-24 NOTE — Patient Instructions (Signed)
 Thank you for attending your appointment today.  -- We did not make any medication changes today. Please continue medications as prescribed.  Please do not make any changes to medications without first discussing with your provider. If you are experiencing a psychiatric emergency, please call 911 or present to your nearest emergency department. Additional crisis, medication management, and therapy resources are included below.  [facility]  [address] [place], [place] [address] [phone] WALK-IN URGENT CARE 24/7 FOR ANYONE [address] [place], [place]  [phone] Fax: [phone] [email or web address] *Interpreters available *Accepts all insurance and uninsured for Urgent Care needs *Accepts Medicaid and uninsured for outpatient treatment (below)      ONLY FOR [facility]  Below:    Outpatient New Patient Assessment/Therapy Walk-ins:        Monday, Wednesday, and Thursday 8am until slots are full (first come, first served)                   New Patient Psychiatry/Medication Management        Monday-Friday 8am-11am (first come, first served)               For all walk-ins we ask that you arrive by 7:15am, because patients will be seen in the order of arrival.

## 2023-03-17 ENCOUNTER — Ambulatory Visit (INDEPENDENT_AMBULATORY_CARE_PROVIDER_SITE_OTHER): Payer: Medicaid Other | Admitting: Mental Health

## 2023-03-17 DIAGNOSIS — F410 Panic disorder [episodic paroxysmal anxiety] without agoraphobia: Secondary | ICD-10-CM | POA: Diagnosis not present

## 2023-03-17 DIAGNOSIS — F411 Generalized anxiety disorder: Secondary | ICD-10-CM | POA: Diagnosis not present

## 2023-03-17 NOTE — Progress Notes (Unsigned)
 THERAPIST PROGRESS NOTE Virtual Visit via Video Note  I connected with Erin Pollard on 03/17/23 at  4:00 PM EST by a video enabled telemedicine application and verified that I am speaking with the correct person using two identifiers.  Location: Patient: home address on file Provider: home office   I discussed the limitations of evaluation and management by telemedicine and the availability of in person appointments. The patient expressed understanding and agreed to proceed.  I discussed the assessment and treatment plan with the patient. The patient was provided an opportunity to ask questions and all were answered. The patient agreed with the plan and demonstrated an understanding of the instructions.   The patient was advised to call back or seek an in-person evaluation if the symptoms worsen or if the condition fails to improve as anticipated.  I provided 47 minutes of non-face-to-face time during this encounter.   [name], [facility]   Session Time: 4:05 pm ( 47 minutes)  Participation Level: Active  Behavioral Response: CasualAlertEuthymic  Type of Therapy: Individual Therapy  Treatment Goals addressed: [name] will increase management of anxiety AEB ability to engage in use of x 3 effective coping skills for anxiety with ability to reframe maladaptive thinking patterns daily as needed within the next 90 days.    ProgressTowards Goals: Progressing  Interventions: Supportive  Summary:Erin Pollard is a 53 y.o. female who presents with dx of generalized anxiety disorder with panic.  [name] presents to session alert and oriented; mood and affect euthymic; stable.Speech clear and coherent at normal rate and tone. Shares to be doing ok and trying to keep her spirits up. Shares for daughter and grand-children to have come and visited her for the weekend and for the visit to have gone well. Shares for relationship with daughter to be rebuilding however still in  disbelief of circumstances. Shares ongoing work part time and shares in need of more money to adequately support self. 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 Shares will explore options for additional employment. Notes feelings of grief during month of February with several passing of loved ones anniversary this month. Shares working to manage feelings of anxiety with use of deep breathing, positive self talk, going outside for air and taking medications as needed. Progress with goals; sxs stable at this time.   Suicidal/Homicidal: Nowithout intent/plan  Therapist Response: Therapist engaged [name] in tele-therapy session. Assessed for current location and ability to hold confidential session. Assessed for current level of functioning sxs management and sxs and level of stressors. Assessed for sxs of depression and anxiety and ability to cope. Active empathic listening, offering support and encouragement. Normalized feelings. Supported in exploring options for work that would not be so strenous on body and with transportation barriers. Assessed of use of coping skills for anxiety and ability to manage anxiety in the work place. Reviewed session and provided follow up.   Plan: Return again in  x 6 weeks.  Diagnosis: Generalized anxiety disorder with panic attacks  Collaboration of Care: Other None  Patient/Guardian was advised Release of Information must be obtained prior to any record release in order to collaborate their care with an outside provider. Patient/Guardian was advised if they have not already done so to contact the registration department to sign all necessary forms in order for [place] to release information regarding their care.   Consent: Patient/Guardian gives verbal consent for treatment and  assignment of benefits for services provided during this visit. Patient/Guardian expressed understanding and agreed to  proceed.   [name] Chen[place], [facility] 03/17/2023

## 2023-03-22 ENCOUNTER — Encounter (HOSPITAL_COMMUNITY): Payer: Self-pay

## 2023-03-22 ENCOUNTER — Emergency Department (HOSPITAL_COMMUNITY)
Admission: EM | Admit: 2023-03-22 | Discharge: 2023-03-22 | Disposition: A | Discharge status: Home / Self Care | Attending: Emergency Medicine | Admitting: Emergency Medicine

## 2023-03-22 ENCOUNTER — Other Ambulatory Visit: Payer: Self-pay

## 2023-03-22 DIAGNOSIS — R140 Abdominal distension (gaseous): Secondary | ICD-10-CM | POA: Insufficient documentation

## 2023-03-22 DIAGNOSIS — R42 Dizziness and giddiness: Secondary | ICD-10-CM | POA: Insufficient documentation

## 2023-03-22 DIAGNOSIS — R1013 Epigastric pain: Secondary | ICD-10-CM | POA: Diagnosis not present

## 2023-03-22 DIAGNOSIS — R109 Unspecified abdominal pain: Secondary | ICD-10-CM | POA: Insufficient documentation

## 2023-03-22 DIAGNOSIS — R110 Nausea: Secondary | ICD-10-CM | POA: Diagnosis not present

## 2023-03-22 DIAGNOSIS — E876 Hypokalemia: Secondary | ICD-10-CM | POA: Insufficient documentation

## 2023-03-22 LAB — COMPREHENSIVE METABOLIC PANEL
ALT: 19 U/L (ref 0–44)
AST: 22 U/L (ref 15–41)
Albumin: 4.4 g/dL (ref 3.5–5.0)
Alkaline Phosphatase: 84 U/L (ref 38–126)
Anion gap: 10 (ref 5–15)
BUN: 10 mg/dL (ref 6–20)
CO2: 27 mmol/L (ref 22–32)
Calcium: 9.8 mg/dL (ref 8.9–10.3)
Chloride: 102 mmol/L (ref 98–111)
Creatinine, Ser: 0.82 mg/dL (ref 0.44–1.00)
GFR, Estimated: >60 mL/min (ref >60)
Glucose, Bld: 88 mg/dL (ref 70–99)
Potassium: 2.8 mmol/L — ABNORMAL LOW (ref 3.5–5.1)
Sodium: 139 mmol/L (ref 135–145)
Total Bilirubin: 0.5 mg/dL (ref 0.0–1.2)
Total Protein: 8.2 g/dL — ABNORMAL HIGH (ref 6.5–8.1)

## 2023-03-22 LAB — CBC
HCT: 39.8 % (ref 36.0–46.0)
Hemoglobin: 12.3 g/dL (ref 12.0–15.0)
MCH: 25.6 pg — ABNORMAL LOW (ref 26.0–34.0)
MCHC: 30.9 g/dL (ref 30.0–36.0)
MCV: 82.9 fL (ref 80.0–100.0)
Platelets: 319 10*3/uL (ref 150–400)
RBC: 4.8 MIL/uL (ref 3.87–5.11)
RDW: 14.8 % (ref 11.5–15.5)
WBC: 4 10*3/uL (ref 4.0–10.5)
nRBC: 0 % (ref 0.0–0.2)

## 2023-03-22 LAB — HCG, SERUM, QUALITATIVE: Preg, Serum: NEGATIVE

## 2023-03-22 LAB — LIPASE, BLOOD: Lipase: 29 U/L (ref 11–51)

## 2023-03-22 LAB — CBG MONITORING, ED: Glucose-Capillary: 78 mg/dL (ref 70–99)

## 2023-03-22 MED ORDER — LIDOCAINE VISCOUS HCL 2 % MT SOLN
15.0000 mL | Freq: Once | OROMUCOSAL | Status: AC
  Administered 2023-03-22: 15 mL via OROMUCOSAL
  Filled 2023-03-22: qty 15

## 2023-03-22 MED ORDER — FAMOTIDINE 20 MG PO TABS
40.0000 mg | ORAL_TABLET | Freq: Once | ORAL | Status: AC
  Administered 2023-03-22: 40 mg via ORAL
  Filled 2023-03-22: qty 2

## 2023-03-22 MED ORDER — POTASSIUM CHLORIDE 10 MEQ/100ML IV SOLN
10.0000 meq | Freq: Once | INTRAVENOUS | Status: AC
  Administered 2023-03-22: 10 meq via INTRAVENOUS
  Filled 2023-03-22: qty 100

## 2023-03-22 MED ORDER — ALUM & MAG HYDROXIDE-SIMETH 200-200-20 MG/5ML PO SUSP
30.0000 mL | Freq: Once | ORAL | Status: AC
  Administered 2023-03-22: 30 mL via ORAL
  Filled 2023-03-22: qty 30

## 2023-03-22 MED ORDER — POTASSIUM CHLORIDE CRYS ER 20 MEQ PO TBCR: 20.0000 meq | EXTENDED_RELEASE_TABLET | Freq: Two times a day (BID) | ORAL | 0 refills | Status: DC

## 2023-03-22 MED ORDER — POTASSIUM CHLORIDE CRYS ER 20 MEQ PO TBCR
40.0000 meq | EXTENDED_RELEASE_TABLET | Freq: Once | ORAL | Status: AC
  Administered 2023-03-22: 40 meq via ORAL
  Filled 2023-03-22: qty 2

## 2023-03-22 NOTE — ED Triage Notes (Signed)
 Pt BIB [facility] from work for nausea and dizziness x1 day. Stroke screen negative. 124/83 67HR 117 cbg 4mg  zofran 20g LAC

## 2023-03-22 NOTE — Discharge Instructions (Addendum)
 Please make sure to continue taking the potassium that was prescribed for you at home every day.  Your potassium levels were low today.

## 2023-03-22 NOTE — ED Notes (Signed)
 Pt states that she has had intermittent dizziness and "feels like her equilibrium is off" x months. Pt also states that her stomach and whole body is inflamed x6 months. Pt states that she felt that her symptoms worsened today and wants to be checked out.

## 2023-03-22 NOTE — ED Provider Notes (Signed)
 [place] EMERGENCY DEPARTMENT AT [facility] Provider Note   CSN: [id number] Arrival date & time: 03/22/23  1603     History  Chief Complaint  Patient presents with   Dizziness   Nausea    Erin Pollard is a 53 y.o. female presenting to the ED with dizziness on and off for several months, also reporting abdominal discomfort and bloating.  Patient reports that she is on Creon and has frequent IBS or digestive issues.  She intermittently has constipation and diarrhea.  He also suffers from bad reflux and reports that she ate an atypical meal of pepper wings 2 days ago.  She says her stomach feels bloated and off.  She felt very nauseated and queasy at work today.  She also had an episode of dizziness which she describes like "the room is tilting around".  She reports she has had similar episodes like this frequently in the morning for several months, she sees an ENT doctor, and often has routine ear cleanings.  She denies fevers, chills, cough, congestion  HPI     Home Medications Prior to Admission medications   Medication Sig Start Date End Date Taking? Authorizing Provider  potassium chloride SA (KLOR-CON M) 20 MEQ tablet Take 1 tablet (20 mEq total) by mouth 2 (two) times daily for 7 days. 03/22/23 03/29/23 Yes Addi Pak, Kermit Balo, MD  atorvastatin (LIPITOR) 40 MG tablet Take 1 tablet (40 mg total) by mouth at bedtime. 10/08/22 04/06/23  Tolia, Sunit, DO  Bempedoic Acid-Ezetimibe (NEXLIZET) 180-10 MG TABS Take 1 tablet by mouth daily. Patient not taking: Reported on 12/23/2022 10/08/22 04/06/23  Tessa Lerner, DO  cholecalciferol (VITAMIN D3) 25 MCG (1000 UNIT) tablet Take 1,000 Units by mouth daily.    [provider]  clonazePAM (KLONOPIN) 0.5 MG tablet Take 1 tablet (0.5 mg total) by mouth 3 (three) times daily. 03/05/23 06/03/23  Bahraini, Sarah A  dicyclomine (BENTYL) 10 MG capsule Take 1 capsule (10 mg total) by mouth every 6 (six) hours as needed for spasms (cramps).  01/07/23   Cirigliano, Vito V, DO  diphenhydrAMINE (BENADRYL ALLERGY) 25 MG tablet Take 2 tablets (50 mg total) by mouth as directed. 1 hour prior to your CT. 10/21/22   Cirigliano, Vito V, DO  Eluxadoline (VIBERZI) 100 MG TABS Take 1 tablet (100 mg total) by mouth 2 (two) times daily before a meal. Patient not taking: Reported on 12/23/2022 10/08/22   Cirigliano, Vito V, DO  famotidine (PEPCID) 20 MG tablet Take 20 mg by mouth as needed. Patient not taking: Reported on 12/23/2022    [provider]  ferrous sulfate 325 (65 FE) MG EC tablet TAKE 1 TABLET BY MOUTH EVERY DAY 02/18/22   Grayce Sessions, NP  hydrALAZINE (APRESOLINE) 25 MG tablet Take 1 tablet (25 mg total) by mouth 3 (three) times daily. 10/08/22   Tolia, Sunit, DO  hydrochlorothiazide (MICROZIDE) 12.5 MG capsule Take 1 capsule (12.5 mg total) by mouth daily. 10/08/22 04/06/23  Tolia, Sunit, DO  KLOR-CON M10 10 MEQ tablet Take 10 mEq by mouth daily. 08/31/21   [provider]  labetalol (NORMODYNE) 100 MG tablet Take 1 tablet (100 mg total) by mouth 2 (two) times daily. 10/08/22 01/06/23  Tolia, Sunit, DO  lipase/protease/amylase (CREON) 36000 UNITS CPEP capsule Take 2 capsules (72,000 Units total) by mouth 3 (three) times daily with meals. May also take 1 capsule (36,000 Units total) as needed (with snacks - up to 2 snacks daily). 11/27/22  Cirigliano, Vito V, DO  loratadine (CLARITIN) 10 MG tablet Take 1 tablet (10 mg total) by mouth daily. 11/02/21   Grayce Sessions, NP  losartan (COZAAR) 50 MG tablet Take 1 tablet (50 mg total) by mouth daily. 10/08/22 04/06/23  Tolia, Sunit, DO  methocarbamol (ROBAXIN) 500 MG tablet Take 1 tablet (500 mg total) by mouth 2 (two) times daily. 07/29/21   Carlisle Beers, FNP  mirtazapine (REMERON) 30 MG tablet Take 1 tablet (30 mg total) by mouth at bedtime. 02/24/23 05/25/23  Bahraini, Sarah A  naproxen (NAPROSYN) 500 MG tablet Take 1 tablet (500 mg total) by mouth 2 (two) times daily.  09/11/21   Tolia, Sunit, DO  Omega-3 Fatty Acids (FISH OIL) 1000 MG CAPS Take 1 capsule by mouth daily.    [provider]  omeprazole (PRILOSEC) 40 MG capsule Omeprazole 40 mg by mouth twice a day for 4 weeks ,then back to 40 mg once a day if that controls reflux symptoms 12/23/22   Cirigliano, Vito V, DO  ondansetron (ZOFRAN-ODT) 4 MG disintegrating tablet Take 1 tablet (4 mg total) by mouth every 8 (eight) hours as needed for nausea or vomiting. 07/29/21   Carlisle Beers, FNP  ondansetron (ZOFRAN-ODT) 4 MG disintegrating tablet Take 1 tablet (4 mg total) by mouth every 8 (eight) hours as needed for nausea or vomiting. 02/14/23   Evern Core, PA-C  oxymetazoline (AFRIN NASAL SPRAY) 0.05 % nasal spray Place 1 spray into both nostrils 2 (two) times daily. 11/02/21   Grayce Sessions, NP  predniSONE (DELTASONE) 50 MG tablet Take 1 tablet by mouth 13 hours prior to CT. Take 1 tablet by mouth 7 hours prior to CT. Take 1 tablet by mouth 1 hour prior to CT. 10/21/22   Cirigliano, Vito V, DO  sucralfate (CARAFATE) 1 g tablet Take 1 tablet (1 g total) by mouth 4 (four) times daily -  with meals and at bedtime. 04/20/21   Roxy Horseman, PA-C      Allergies    Amlodipine, Hydrocodone-acetaminophen, Iodinated contrast media, Iodine, Codeine, Hydrocortisone-iodoquinol, and Propoxyphene    Review of Systems   Review of Systems  Physical Exam Updated Vital Signs BP 134/76   Pulse 67   Temp 97.8 F (36.6 C) (Oral)   Resp 20   Ht 5' (1.524 m)   Wt 73 kg   SpO2 95%   BMI 31.44 kg/m  Physical Exam Constitutional:      General: She is not in acute distress. HENT:     Head: Normocephalic and atraumatic.     Right Ear: Tympanic membrane and ear canal normal.     Left Ear: Tympanic membrane and ear canal normal.  Eyes:     Conjunctiva/sclera: Conjunctivae normal.     Pupils: Pupils are equal, round, and reactive to light.  Cardiovascular:     Rate and Rhythm: Normal rate and  regular rhythm.  Pulmonary:     Effort: Pulmonary effort is normal. No respiratory distress.  Abdominal:     General: There is no distension.     Tenderness: There is no abdominal tenderness.  Skin:    General: Skin is warm and dry.  Neurological:     General: No focal deficit present.     Mental Status: She is alert and oriented to person, place, and time. Mental status is at baseline.  Psychiatric:        Mood and Affect: Mood normal.        Behavior:  Behavior normal.     ED Results / Procedures / Treatments   Labs (all labs ordered are listed, but only abnormal results are displayed) Labs Reviewed  CBC - Abnormal; Notable for the following components:      Result Value   MCH 25.6 (*)    All other components within normal limits  COMPREHENSIVE METABOLIC PANEL - Abnormal; Notable for the following components:   Potassium 2.8 (*)    Total Protein 8.2 (*)    All other components within normal limits  HCG, SERUM, QUALITATIVE  LIPASE, BLOOD  CBG MONITORING, ED    EKG EKG Interpretation Date/Time:  Saturday March 22 2023 16:27:41 EST Ventricular Rate:  65 PR Interval:  165 QRS Duration:  79 QT Interval:  401 QTC Calculation: 417 R Axis:   -8  Text Interpretation: Sinus rhythm Consider anterior infarct Confirmed by Alvester Chou [phone]) on 03/22/2023 7:06:28 PM  Radiology No results found.  Procedures .Critical Care  Performed by: Terald Sleeper, MD Authorized by: Terald Sleeper, MD   Critical care provider statement:    Critical care time (minutes):  35   Critical care time was exclusive of:  Separately billable procedures and treating other patients   Critical care was necessary to treat or prevent imminent or life-threatening deterioration of the following conditions:  Metabolic crisis   Critical care was time spent personally by me on the following activities:  Ordering and performing treatments and interventions, ordering and review of laboratory  studies, ordering and review of radiographic studies, pulse oximetry, review of old charts, examination of patient and evaluation of patient's response to treatment Comments:     Hypokalemia repletion     Medications Ordered in ED Medications  potassium chloride 10 mEq in 100 mL IVPB (0 mEq Intravenous Stopped 03/22/23 2103)  potassium chloride SA (KLOR-CON M) CR tablet 40 mEq (40 mEq Oral Given 03/22/23 1938)  alum & mag hydroxide-simeth (MAALOX/MYLANTA) 200-200-20 MG/5ML suspension 30 mL (30 mLs Oral Given 03/22/23 1946)  famotidine (PEPCID) tablet 40 mg (40 mg Oral Given 03/22/23 1938)  lidocaine (XYLOCAINE) 2 % viscous mouth solution 15 mL (15 mLs Mouth/Throat Given 03/22/23 1946)    ED Course/ Medical Decision Making/ A&P Clinical Course as of 03/22/23 2157  Sat Mar 22, 2023  2029 Improvement of abdominal discomfort with GI cocktail.  Suspect this likely gastritis.  Patient is stable for discharge and outpatient follow-up [MT]    Clinical Course User Index [MT] Shannelle Alguire, Kermit Balo, MD                                 Medical Decision Making Amount and/or Complexity of Data Reviewed Labs: ordered.  Risk OTC drugs. Prescription drug management.   This patient presents to the ED with concern for vertigo, epigastric discomfort, fatigue. This involves an extensive number of treatment options, and is a complaint that carries with it a high risk of complications and morbidity.    Overall her vertigo appears to be a chronic issue as she has never for many months of this, tends to be worse in the daytime and when waking up from bed, and she has issues with needing disimpaction for years.  Her ears currently do not appear impacted or infected.  Her symptoms are quite mild and not believe she is needing any further treatment or workup from the standpoint.  I do not suspect that this is a stroke or  CNS lesion.  Her abdominal pain may be faulty multifactorial.  She herself reports she has IBS issues  and also reflux gastritis and feels that the latter is likely the issue as she ate spicy chicken wings recently.  She says the GI cocktail "always fixes my pain".  Therefore we will try GI cocktail with Maalox, lidocaine, Pepcid.  Co-morbidities that complicate the patient evaluation: History of reflux, IBS  I ordered and personally interpreted labs.  The pertinent results include: Potassium is 2.8.  Labs are otherwise unremarkable.  Patient notes that she has chronic hypokalemia and that she has not been taking her potassium at home as prescribed.  We will replete potassium and I strongly encouraged her to resume the home K, which she says she will  The patient was maintained on a cardiac monitor.  I personally viewed and interpreted the cardiac monitored which showed an underlying rhythm of: Sinus rhythm  Per my interpretation the patient's ECG shows no acute ischemic findings  I ordered medication including oral and IV potassium for hypokalemia, GI cocktail for stomach upset   I have reviewed the patients home medicines and have made adjustments as needed  Test Considered: Low suspicion for acute intra-abdominal surgical or inflammatory process requiring CT imaging at this time.  Low suspicion for appendicitis, biliary disease, pancreatitis.  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 and resolved with GI cocktail   After the interventions noted above, I reevaluated the patient and found that they have: improved   Dispostion:  After consideration of the diagnostic results and the patients response to treatment, I feel that the patent would benefit from close outpatient follow-up         Final Clinical Impression(s) / ED Diagnoses Final diagnoses:  Hypokalemia  Vertigo  Abdominal discomfort    Rx / DC Orders ED Discharge Orders          Ordered    potassium chloride SA (KLOR-CON M) 20 MEQ tablet  2 times daily        03/22/23 2030              Terald Sleeper, MD 03/22/23 2158

## 2023-03-24 ENCOUNTER — Ambulatory Visit (INDEPENDENT_AMBULATORY_CARE_PROVIDER_SITE_OTHER): Payer: Self-pay | Admitting: Primary Care

## 2023-03-24 ENCOUNTER — Other Ambulatory Visit (INDEPENDENT_AMBULATORY_CARE_PROVIDER_SITE_OTHER): Payer: Self-pay | Admitting: Primary Care

## 2023-03-24 NOTE — Telephone Encounter (Signed)
  Chief Complaint: Hospital f/u  Disposition: [] ED /[] Urgent Care (no appt availability in office) / [x] Appointment(In office/virtual)/ []  [facility]/ [] Home Care/ [] Refused Recommended Disposition /[] [facility]/ []  Follow-up with PCP Additional Notes: Patient calls stating she was in the ED over the weekend for hypokalemia, requesting ED follow up. Scheduled with PCP for 03/31/23 @ 1450.   Reason for Disposition  General information question, no triage required and triager able to answer question  Answer Assessment - Initial Assessment Questions 1. REASON FOR CALL or QUESTION: "What is your reason for calling today?" or "How can I best help you?" or "What question do you have that I can help answer?"     Patient reports she was in the hospital for hypokalemia 03/22/23.  Protocols used: Information Only Call - No Triage-A-AH

## 2023-03-24 NOTE — Telephone Encounter (Unsigned)
 Copied from CRM [id number]. Topic: Clinical - Medication Refill >> Mar 24, 2023  2:47 PM DeAngela L wrote: Most Recent Primary Care Visit:  Provider: Grayce Sessions  Department: [facility]  Visit Type: PHYSICAL  Date: 12/11/2021  Medication: Patient called to have prescription filled the hospital only gave her a 7 day fill and she takes daily   Has the patient contacted their pharmacy? No, patient is [place] at her daughters house and wants to have it sent to [facility] near ther (Agent: If no, request that the patient contact the pharmacy for the refill. If patient does not wish to contact the pharmacy document the reason why and proceed with request.) (Agent: If yes, when and what did the pharmacy advise?)  Is this the correct pharmacy for this prescription? Yes If no, delete pharmacy and type the correct one.  This is the patient's preferred pharmacy: the patient wants to use the pharmacy near her daughter house for now   [facility]  [address] [place] [address]   Has the prescription been filled recently? Yes  Is the patient out of the medication? No  Has the patient been seen for an appointment in the last year OR does the patient have an upcoming appointment? Yes  Can we respond through MyChart? Yes  Agent: Please be advised that Rx refills may take up to 3 business days. We ask that you follow-up with your pharmacy.

## 2023-03-25 ENCOUNTER — Telehealth (INDEPENDENT_AMBULATORY_CARE_PROVIDER_SITE_OTHER): Payer: Self-pay | Admitting: Primary Care

## 2023-03-25 NOTE — Telephone Encounter (Signed)
 Duplicate request, last refill 03/22/23.  Requested Prescriptions  Pending Prescriptions Disp Refills   potassium chloride SA (KLOR-CON M) 20 MEQ tablet 14 tablet 0    Sig: Take 1 tablet (20 mEq total) by mouth 2 (two) times daily for 7 days.     Endocrinology:  Minerals - Potassium Supplementation Failed - 03/25/2023  4:18 PM      Failed - K in normal range and within 360 days    Potassium  Date Value Ref Range Status  03/22/2023 2.8 (L) 3.5 - 5.1 mmol/L Final         Failed - Valid encounter within last 12 months    Recent Outpatient Visits           1 year ago Encounter for HCV screening test for low risk patient   [place] [facility] Grayce Sessions, NP   1 year ago Liver cyst   [place] [facility] Grayce Sessions, NP   1 year ago Essential hypertension   [place] [facility] Grayce Sessions, NP   2 years ago Screening for diabetes mellitus   [place] [facility] Grayce Sessions, NP   2 years ago Encounter to establish care    [facility] Grayce Sessions, NP              Passed - Cr in normal range and within 360 days    Creatinine, Ser  Date Value Ref Range Status  03/22/2023 0.82 0.44 - 1.00 mg/dL Final

## 2023-03-25 NOTE — Telephone Encounter (Signed)
 Called pt to remind them about appt. Pt will be present.

## 2023-03-31 ENCOUNTER — Ambulatory Visit (INDEPENDENT_AMBULATORY_CARE_PROVIDER_SITE_OTHER): Admitting: Primary Care

## 2023-03-31 ENCOUNTER — Other Ambulatory Visit: Payer: Self-pay

## 2023-03-31 ENCOUNTER — Other Ambulatory Visit: Payer: Self-pay | Admitting: Cardiology

## 2023-03-31 ENCOUNTER — Telehealth: Payer: Self-pay | Admitting: Cardiology

## 2023-03-31 ENCOUNTER — Encounter (INDEPENDENT_AMBULATORY_CARE_PROVIDER_SITE_OTHER): Payer: Self-pay | Admitting: Primary Care

## 2023-03-31 VITALS — BP 114/77 | HR 75 | Temp 98.7°F | Ht 60.0 in | Wt 149.8 lb

## 2023-03-31 DIAGNOSIS — Z09 Encounter for follow-up examination after completed treatment for conditions other than malignant neoplasm: Secondary | ICD-10-CM

## 2023-03-31 DIAGNOSIS — E876 Hypokalemia: Secondary | ICD-10-CM

## 2023-03-31 DIAGNOSIS — Z1231 Encounter for screening mammogram for malignant neoplasm of breast: Secondary | ICD-10-CM | POA: Diagnosis not present

## 2023-03-31 DIAGNOSIS — E559 Vitamin D deficiency, unspecified: Secondary | ICD-10-CM

## 2023-03-31 NOTE — Patient Instructions (Signed)
 Potassium is slightly below normal saline and potassium supplement take daily . Potassium plays a very key role in helping  nerves to function and muscles to contract and your heartbeat to stay regular.  Potassium Content of Foods  The body needs potassium to control blood pressure and to keep the muscles and nervous system healthy. Here are some healthy foods below that are high in potassium. Also you can get the white label salt of "NO SALT" salt substitute, 1/4 teaspoon of this is equivalent to potassium.   FOODS AND DRINKS HIGH IN POTASSIUM FOODS MODERATE IN POTASSIUM   Fruits Avocado (cubed),  c / 50 g. Banana (sliced), 75 g. Cantaloupe (cubed), 80 g. Honeydew, 1 wedge / 85 g. Kiwi (sliced), 90 g. Nectarine, 1 small / 129 g. Orange, 1 medium / 131 g. Vegetables Artichoke,  of a medium / 64 g. Asparagus (boiled), 90 g.. Broccoli (boiled), 78 g. Brussels sprout (boiled), 78 g. Butternut squash (baked), 103 g. Chickpea (cooked), 82 g. Green peas (cooked), 80 g. Kidney beans (cooked), 5 tbsp / 55 g. Lima beans (cooked),  c / 43 g. Navy beans (cooked),  c / 61 g. Spinach (cooked),  c / 45 g. Sweet potato (baked),  c / 50 g. Tomato (chopped or sliced), 90 g. Vegetable juice. White mushrooms (cooked), 78 g. Yam (cooked or baked),  c / 34 g. Zucchini squash (boiled), 90 g. Other Foods and Drinks Almonds (whole),  c / 36 g. Fish, 3 oz / 85 g. Nonfat fruit variety yogurt, 123 g. Pistachio nuts, 1 oz / 28 g. Pumpkin seeds, 1 oz / 28 g. Red meat (broiled, cooked, grilled), 3 oz / 85 g. Scallops (steamed), 3 oz / 85 g. Spaghetti sauce,  c / 66 g. Sunflower seeds (dry roasted), 1 oz / 28 g. Veggie burger, 1 patty / 70 g. Fruits Grapefruit,  of the fruit / 123 g Plums (sliced), 83 g. Tangerine, 1 large / 120 g. Vegetables Carrots (boiled), 78 g. Carrots (sliced), 61 g. Rhubarb (cooked with sugar), 120 g. Rutabaga (cooked), 120 g. Yellow snap beans (cooked), 63  g. Other Foods and Drinks  Chicken breast (roasted and chopped),  c / 70 g. Pita bread, 1 large / 64 g. Shrimp (steamed), 4 oz / 113 g. Swiss cheese (diced), 70 g.

## 2023-03-31 NOTE — Telephone Encounter (Signed)
*  STAT* If patient is at the pharmacy, call can be transferred to refill team.   1. Which medications need to be refilled? (please list name of each medication and dose if known)  labetalol (NORMODYNE) 100 MG tablet losartan (COZAAR) 50 MG tablet hydrochlorothiazide (MICROZIDE) 12.5 MG capsule potassium chloride SA (KLOR-CON M) 10 MEQ tablet   2. Would you like to learn more about the convenience, safety, & potential cost savings by using the [facility]? N/A    3. Are you open to using the [facility] (Type [facility]. N/A   4. Which pharmacy/location (including street and city if local pharmacy) is medication to be sent to?  [id number] - [place], [place] - [address]     5. Do they need a 30 day or 90 day supply? 90 day  Patient runs out of medications tomorrow, appt is setup for 03/19.

## 2023-03-31 NOTE — Progress Notes (Signed)
 Subjective:   Erin Pollard is a 53 y.o. female  presented to ED 03/22/23 with dizziness on and off for several months, also reporting abdominal discomfort and bloating.She intermittently has constipation and diarrhea. (IBS). K+ 2.8  replaced .Patient states she feel worst on creon and losing weight for no reason- no diet changes. Refer to [employer]. Past Medical History:  Diagnosis Date   Anxiety    Cervical radiculopathy    GERD (gastroesophageal reflux disease)    Hiatal hernia 08/28/2022   [facility] was found through a CT scan and fibroids and cyst on liver parenchyma   Hypertension    Myocardial infarction [facility])    Panic attack      Allergies  Allergen Reactions   Amlodipine Hives    Achy legs and s.o.b Achy legs and s.o.b    Hydrocodone-Acetaminophen Hives and Rash   Iodinated Contrast Media Hives   Iodine Hives and Rash   Codeine    Hydrocortisone-Iodoquinol    Propoxyphene     Current Outpatient Medications on File Prior to Visit  Medication Sig Dispense Refill   atorvastatin (LIPITOR) 40 MG tablet Take 1 tablet (40 mg total) by mouth at bedtime. 90 tablet 1   cholecalciferol (VITAMIN D3) 25 MCG (1000 UNIT) tablet Take 1,000 Units by mouth daily.     clonazePAM (KLONOPIN) 0.5 MG tablet Take 1 tablet (0.5 mg total) by mouth 3 (three) times daily. 90 tablet 2   dicyclomine (BENTYL) 10 MG capsule Take 1 capsule (10 mg total) by mouth every 6 (six) hours as needed for spasms (cramps). 70 capsule 1   diphenhydrAMINE (BENADRYL ALLERGY) 25 MG tablet Take 2 tablets (50 mg total) by mouth as directed. 1 hour prior to your CT. 30 tablet 0   ferrous sulfate 325 (65 FE) MG EC tablet TAKE 1 TABLET BY MOUTH EVERY DAY 90 tablet 0   hydrALAZINE (APRESOLINE) 25 MG tablet Take 1 tablet (25 mg total) by mouth 3 (three) times daily. 270 tablet 3   lipase/protease/amylase (CREON) 36000 UNITS CPEP capsule Take 2 capsules (72,000 Units total) by mouth 3 (three) times daily  with meals. May also take 1 capsule (36,000 Units total) as needed (with snacks - up to 2 snacks daily). 300 capsule 1   loratadine (CLARITIN) 10 MG tablet Take 1 tablet (10 mg total) by mouth daily. 30 tablet 6   mirtazapine (REMERON) 30 MG tablet Take 1 tablet (30 mg total) by mouth at bedtime. 30 tablet 2   naproxen (NAPROSYN) 500 MG tablet Take 1 tablet (500 mg total) by mouth 2 (two) times daily. 90 tablet 1   Omega-3 Fatty Acids (FISH OIL) 1000 MG CAPS Take 1 capsule by mouth daily.     omeprazole (PRILOSEC) 40 MG capsule Omeprazole 40 mg by mouth twice a day for 4 weeks ,then back to 40 mg once a day if that controls reflux symptoms 90 capsule 3   ondansetron (ZOFRAN-ODT) 4 MG disintegrating tablet Take 1 tablet (4 mg total) by mouth every 8 (eight) hours as needed for nausea or vomiting. 20 tablet 0   oxymetazoline (AFRIN NASAL SPRAY) 0.05 % nasal spray Place 1 spray into both nostrils 2 (two) times daily. 15 mL 0   sucralfate (CARAFATE) 1 g tablet Take 1 tablet (1 g total) by mouth 4 (four) times daily -  with meals and at bedtime. 120 tablet 0   Bempedoic Acid-Ezetimibe (NEXLIZET) 180-10 MG TABS Take 1 tablet by mouth daily. (Patient  not taking: Reported on 03/31/2023) 90 tablet 1   Eluxadoline (VIBERZI) 100 MG TABS Take 1 tablet (100 mg total) by mouth 2 (two) times daily before a meal. (Patient not taking: Reported on 03/31/2023) 60 tablet 3   famotidine (PEPCID) 20 MG tablet Take 20 mg by mouth as needed. (Patient not taking: Reported on 03/31/2023)     methocarbamol (ROBAXIN) 500 MG tablet Take 1 tablet (500 mg total) by mouth 2 (two) times daily. (Patient not taking: Reported on 03/31/2023) 20 tablet 0   ondansetron (ZOFRAN-ODT) 4 MG disintegrating tablet Take 1 tablet (4 mg total) by mouth every 8 (eight) hours as needed for nausea or vomiting. 12 tablet 0   predniSONE (DELTASONE) 50 MG tablet Take 1 tablet by mouth 13 hours prior to CT. Take 1 tablet by mouth 7 hours prior to CT. Take 1  tablet by mouth 1 hour prior to CT. (Patient not taking: Reported on 03/31/2023) 3 tablet 0   No current facility-administered medications on file prior to visit.    Review of System: ROS Comprehensive ROS Pertinent positive and negative noted in HPI   Objective:  BP 114/77   Pulse 75   Temp 98.7 F (37.1 C) (Oral)   Ht 5' (1.524 m)   Wt 149 lb 12.8 oz (67.9 kg)   SpO2 98%   BMI 29.26 kg/m   Filed Weights   03/31/23 1454  Weight: 149 lb 12.8 oz (67.9 kg)    Physical Exam Vitals reviewed.  Constitutional:      Appearance: Normal appearance.  HENT:     Head: Normocephalic.     Right Ear: Tympanic membrane, ear canal and external ear normal.     Left Ear: Tympanic membrane, ear canal and external ear normal.     Nose: Nose normal.     Mouth/Throat:     Mouth: Mucous membranes are moist.  Eyes:     Extraocular Movements: Extraocular movements intact.     Pupils: Pupils are equal, round, and reactive to light.  Cardiovascular:     Rate and Rhythm: Normal rate.  Pulmonary:     Effort: Pulmonary effort is normal.     Breath sounds: Normal breath sounds.  Abdominal:     General: Bowel sounds are normal.     Palpations: Abdomen is soft.  Musculoskeletal:        General: Normal range of motion.     Cervical back: Normal range of motion.  Skin:    General: Skin is warm and dry.  Neurological:     Mental Status: She is alert and oriented to person, place, and time.  Psychiatric:        Mood and Affect: Mood normal.        Behavior: Behavior normal.        Thought Content: Thought content normal.      Assessment:  Erin Pollard was seen today for hospitalization follow-up.  Diagnoses and all orders for this visit:  Hospital discharge follow-up See HPI  Hypokalemia -     CMP14+EGFR  Vitamin D deficiency -     Vitamin D, 25-hydroxy Encounter for screening mammogram for malignant neoplasm of breast -     MM DIGITAL SCREENING BILATERAL; Future       This note  has been created with Education officer, environmental. Any transcriptional errors are unintentional.   Return in about 3 months (around 07/01/2023), or hypokalemia.  Grayce Sessions, NP 04/06/2023, 2:40 PM

## 2023-03-31 NOTE — Progress Notes (Signed)
 Asking for blood work to be done.   Patient states hospital wants you to follow up about her potassium dropping.   States she only got 1 week worth of potassium.

## 2023-04-01 ENCOUNTER — Other Ambulatory Visit: Payer: Self-pay

## 2023-04-01 DIAGNOSIS — I10 Essential (primary) hypertension: Secondary | ICD-10-CM

## 2023-04-01 LAB — CMP14+EGFR
ALT: 14 IU/L (ref 0–32)
AST: 17 IU/L (ref 0–40)
Albumin: 4.7 g/dL (ref 3.8–4.9)
Alkaline Phosphatase: 105 IU/L (ref 44–121)
BUN/Creatinine Ratio: 14 (ref 9–23)
BUN: 11 mg/dL (ref 6–24)
Bilirubin Total: <0.2 mg/dL (ref 0.0–1.2)
CO2: 25 mmol/L (ref 20–29)
Calcium: 10 mg/dL (ref 8.7–10.2)
Chloride: 98 mmol/L (ref 96–106)
Creatinine, Ser: 0.76 mg/dL (ref 0.57–1.00)
Globulin, Total: 2.6 g/dL (ref 1.5–4.5)
Glucose: 92 mg/dL (ref 70–99)
Potassium: 4.5 mmol/L (ref 3.5–5.2)
Sodium: 141 mmol/L (ref 134–144)
Total Protein: 7.3 g/dL (ref 6.0–8.5)
eGFR: 93 mL/min/{1.73_m2} (ref >59)

## 2023-04-01 LAB — VITAMIN D 25 HYDROXY (VIT D DEFICIENCY, FRACTURES): Vit D, 25-Hydroxy: 16 ng/mL — ABNORMAL LOW (ref 30.0–100.0)

## 2023-04-01 MED ORDER — HYDROCHLOROTHIAZIDE 12.5 MG PO CAPS: 12.5000 mg | ORAL_CAPSULE | Freq: Every day | ORAL | 1 refills | Status: DC

## 2023-04-01 MED ORDER — KLOR-CON M10 10 MEQ PO TBCR: 10.0000 meq | EXTENDED_RELEASE_TABLET | Freq: Every day | ORAL | 1 refills | Status: AC

## 2023-04-01 MED ORDER — LOSARTAN POTASSIUM 50 MG PO TABS: 50.0000 mg | ORAL_TABLET | Freq: Every day | ORAL | 1 refills | Status: DC

## 2023-04-01 MED ORDER — LABETALOL HCL 100 MG PO TABS: 100.0000 mg | ORAL_TABLET | Freq: Two times a day (BID) | ORAL | 1 refills | Status: DC

## 2023-04-01 NOTE — Telephone Encounter (Signed)
 Called patient to discuss refill request:        labetalol (NORMODYNE) 100 MG tablet--per patient taking 100 mg 2 times daily losartan (COZAAR) 50 MG tablet -per patient taking 50 mg 1 time daily hydrochlorothiazide (MICROZIDE) 12.5 MG capsule-per patient taking 12.5 1 time daily potassium chloride SA (KLOR-CON M) 10 MEQ tablet-per patient taking 10 meq 1 time daily

## 2023-04-01 NOTE — Addendum Note (Signed)
 Addended byDurenda Hurt on: 04/01/2023 10:05 AM   Modules accepted: Orders

## 2023-04-02 ENCOUNTER — Telehealth (INDEPENDENT_AMBULATORY_CARE_PROVIDER_SITE_OTHER): Payer: Self-pay | Admitting: Primary Care

## 2023-04-02 NOTE — Telephone Encounter (Signed)
 E2C2 called in stating Pt vitamin D levels were low and pt wanted to know what her next steps are. Pt requesting to speak with the nurse and is expecting a call back.

## 2023-04-02 NOTE — Telephone Encounter (Signed)
 Labs were finalized yesterday at 16:36pm. Always inform patient that it takes provider 48-72 hours to review labs and once provider see labs she will send them a MyChart message with results and any medication needed. Provider will be looking at labs today.

## 2023-04-03 ENCOUNTER — Encounter (INDEPENDENT_AMBULATORY_CARE_PROVIDER_SITE_OTHER): Payer: Self-pay | Admitting: Primary Care

## 2023-04-03 ENCOUNTER — Other Ambulatory Visit (INDEPENDENT_AMBULATORY_CARE_PROVIDER_SITE_OTHER): Payer: Self-pay | Admitting: Primary Care

## 2023-04-03 MED ORDER — VITAMIN D (ERGOCALCIFEROL) 1.25 MG (50000 UNIT) PO CAPS: 50000.0000 [IU] | ORAL_CAPSULE | ORAL | 0 refills | Status: DC

## 2023-04-03 NOTE — Telephone Encounter (Signed)
 Pt calling back about he labs.  Advised pt of the message, and provider has  not commented on them yet.  pt verbalized understanding

## 2023-04-06 ENCOUNTER — Encounter (INDEPENDENT_AMBULATORY_CARE_PROVIDER_SITE_OTHER): Payer: Self-pay | Admitting: Primary Care

## 2023-04-24 NOTE — Progress Notes (Signed)
 BH MD Outpatient Progress Note  04/28/2023 11:53 AM [name]  MRN:  [id number]  Assessment:  Erin Pollard presents for follow-up evaluation. Today, 04/28/23, patient shares current overwhelm related to ongoing psychosocial stressors (food and financial instability). Despite these challenges, however, she denies persistent decline of mood or anxiety and reports intact resiliency. She remains hopeful for improvement and is actively working to prepare credentialing as she looks into jobs that would provide more fulfillment. She reports adherence to potassium and Vitamin D supplementation. While option of changing or adding psychotropic was explored, she declines again citing that mood and anxiety have remained at overall baseline outside of temporary changes in face of acute stress. She remains engaged in psychotherapy.  RTC in approx. 2 months by video.  Identifying Information: Erin Pollard is a 53 y.o. female with a history of generalized anxiety disorder with panic attacks, HTN, past MI (2017), HLD, and IBS who is an established patient with [facility] participating in follow-up via video conferencing.   Plan:  # GAD with panic attacks Past medication trials: Zoloft, Valium (nausea), patient reports "numerous others" that requires further exploration Status of problem: chronic with mild exacerbation Interventions: -- Continue mirtazapine 30 mg qHS -- Continue Klonopin 0.5 mg TID PRN panic attacks (patient using TID)  -- Risks of long-term use have been extensively discussed and patient expressed understanding and desire to continue as she finds it effective with absence of side effects  -- PDMP reviewed with appropriate filling -- Could consider Cymbalta in the future given vasomotor symptoms of perimenopause -- Patient recently started on Vitamin D supplementation by PCP for Vitamin D deficiency -- Continue individual psychotherapy with [name],  [facility]  Patient was given contact information for behavioral health clinic and was instructed to call 911 for emergencies.   Subjective:  Chief Complaint:  Chief Complaint  Patient presents with   Medication Management    Interval History:   Patient reports she is "not good" as she just got back from social services and was told she no longer qualifies for food stamps as she doesn't have any dependents and is considered able to work full-time. Worried about lights being turned off.  Reports dealing with medical issues lately related to low K; K has improved on recent check after starting oral potassium - endorses adherence. Has also started weekly Vitamin D.  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 Reports being written up recently for being out sick for 2 weeks. Identifies desire to get back into counseling in the future; working on getting updated certifications.  Feels that despite ongoing stressors, mood and anxiety have been overall well controlled and that current level of overwhelm is normal given circumstances/does not persist. Remeron remains helpful for sleeping at night; sleeping 7-8 hours nightly. Appetite is stable.   Denies passive/active SI and remains aware of bigger picture and feels hopeful things will improve. Reflects on how relationship with Janine Limbo has improved.  Declines changes to medications at this time and opts to continue as currently prescribed.   PDMP: -- Klonopin 0.5 mg QTY 90 last filled 04/02/23 (rx dating back to 2021)  Visit Diagnosis:    ICD-10-CM   1. Generalized anxiety disorder with panic attacks  F41.1 mirtazapine (REMERON) 30 MG tablet   F41.0 clonazePAM (KLONOPIN) 0.5 MG tablet     Past Psychiatric History:  Diagnoses: GAD with panic attacks Medication trials:  Zoloft, Valium (nausea), patient reports "numerous others" that requires further exploration Hospitalizations: x1  in 1996 for suicide attempt Suicide attempts: x1 in 1997 via cutting Substance use: denies use of etoh, tobacco, or illicit drugs  -- Tobacco: quit smoking 2017  Past Medical History:  Past Medical History:  Diagnosis Date   Anxiety    Cervical radiculopathy    GERD (gastroesophageal reflux disease)    Hiatal hernia 08/28/2022   [facility] was found through a CT scan and fibroids and cyst on liver parenchyma   Hypertension    Myocardial infarction (HCC)    Panic attack     Past Surgical History:  Procedure Laterality Date   COLONOSCOPY     LEFT HEART CATH     TUBAL LIGATION     UPPER GASTROINTESTINAL ENDOSCOPY      Family Psychiatric History:  Reports 2 of her sons have struggled with substance use  Family History:  Family History  Problem Relation Age of Onset   Heart attack Mother    Cancer Father    Stroke Father    Stroke Sister    Heart disease Maternal Grandmother    Heart attack Maternal Grandmother    Heart disease Maternal Grandfather    Stroke Maternal Grandfather    Esophageal cancer Neg Hx    Colon cancer Neg Hx    Stomach cancer Neg Hx     Social History:  Social History   Socioeconomic History   Marital status: Divorced    Spouse name: Not on file   Number of children: 4   Years of education: Not on file   Highest education level: Associate degree: occupational, Scientist, product/process development, or vocational program  Occupational History   Not on file  Tobacco Use   Smoking status: Former    Current packs/day: 0.00    Types: Cigarettes    Quit date: 2017    Years since quitting: 8.2   Smokeless tobacco: Not on file  Vaping Use   Vaping status: Never Used  Substance and Sexual Activity   Alcohol use: Not Currently   Drug use: Never   Sexual activity: Yes  Other Topics Concern   Not on file  Social History Narrative   Not on file   Social Drivers of Health   Financial Resource Strain: Medium Risk (07/23/2022)   Overall Financial  Resource Strain (CARDIA)    Difficulty of Paying Living Expenses: Somewhat hard  Food Insecurity: Food Insecurity Present (07/23/2022)   Hunger Vital Sign    Worried About Running Out of Food in the Last Year: Sometimes true    Ran Out of Food in the Last Year: Sometimes true  Transportation Needs: No Transportation Needs (07/23/2022)   PRAPARE - Administrator, Civil Service (Medical): No    Lack of Transportation (Non-Medical): No  Physical Activity: Sufficiently Active (07/23/2022)   Exercise Vital Sign    Days of Exercise per Week: 5 days    Minutes of Exercise per Session: 30 min  Stress: No Stress Concern Present (07/23/2022)   [employer] of Occupational Health - Occupational Stress Questionnaire    Feeling of Stress : Only a little  Social Connections: Moderately Isolated (07/23/2022)   Social Connection and Isolation Panel [NHANES]    Frequency of Communication with Friends and Family: More than three times a week    Frequency of Social Gatherings with Friends and Family: More than three times a week    Attends Religious Services: 1  to 4 times per year    Active Member of Clubs or Organizations: No    Attends Banker Meetings: Not on file    Marital Status: Divorced    Allergies:  Allergies  Allergen Reactions   Amlodipine Hives    Achy legs and s.o.b Achy legs and s.o.b    Hydrocodone-Acetaminophen Hives and Rash   Iodinated Contrast Media Hives   Iodine Hives and Rash   Codeine    Hydrocortisone-Iodoquinol    Propoxyphene     Current Medications: Current Outpatient Medications  Medication Sig Dispense Refill   KLOR-CON M10 10 MEQ tablet Take 1 tablet (10 mEq total) by mouth daily. 90 tablet 1   Vitamin D, Ergocalciferol, (DRISDOL) 1.25 MG (50000 UNIT) CAPS capsule Take 1 capsule (50,000 Units total) by mouth every 7 (seven) days. 8 capsule 0   atorvastatin (LIPITOR) 40 MG tablet Take 1 tablet (40 mg total) by mouth at bedtime. 90 tablet 1    cholecalciferol (VITAMIN D3) 25 MCG (1000 UNIT) tablet Take 1,000 Units by mouth daily.     [START ON 05/02/2023] clonazePAM (KLONOPIN) 0.5 MG tablet Take 1 tablet (0.5 mg total) by mouth 3 (three) times daily. 90 tablet 2   dicyclomine (BENTYL) 10 MG capsule Take 1 capsule (10 mg total) by mouth every 6 (six) hours as needed for spasms (cramps). 70 capsule 1   diphenhydrAMINE (BENADRYL ALLERGY) 25 MG tablet Take 2 tablets (50 mg total) by mouth as directed. 1 hour prior to your CT. 30 tablet 0   Eluxadoline (VIBERZI) 100 MG TABS Take 1 tablet (100 mg total) by mouth 2 (two) times daily before a meal. (Patient not taking: Reported on 03/31/2023) 60 tablet 3   famotidine (PEPCID) 20 MG tablet Take 20 mg by mouth as needed. (Patient not taking: Reported on 03/31/2023)     ferrous sulfate 325 (65 FE) MG EC tablet TAKE 1 TABLET BY MOUTH EVERY DAY 90 tablet 0   hydrALAZINE (APRESOLINE) 25 MG tablet Take 1 tablet (25 mg total) by mouth 3 (three) times daily. 270 tablet 3   hydrochlorothiazide (MICROZIDE) 12.5 MG capsule Take 1 capsule (12.5 mg total) by mouth daily. 90 capsule 1   labetalol (NORMODYNE) 100 MG tablet Take 1 tablet (100 mg total) by mouth 2 (two) times daily. 180 tablet 1   lipase/protease/amylase (CREON) 36000 UNITS CPEP capsule Take 2 capsules (72,000 Units total) by mouth 3 (three) times daily with meals. May also take 1 capsule (36,000 Units total) as needed (with snacks - up to 2 snacks daily). 300 capsule 1   loratadine (CLARITIN) 10 MG tablet Take 1 tablet (10 mg total) by mouth daily. 30 tablet 6   losartan (COZAAR) 50 MG tablet Take 1 tablet (50 mg total) by mouth daily. 90 tablet 1   methocarbamol (ROBAXIN) 500 MG tablet Take 1 tablet (500 mg total) by mouth 2 (two) times daily. (Patient not taking: Reported on 03/31/2023) 20 tablet 0   mirtazapine (REMERON) 30 MG tablet Take 1 tablet (30 mg total) by mouth at bedtime. 30 tablet 2   naproxen (NAPROSYN) 500 MG tablet Take 1 tablet  (500 mg total) by mouth 2 (two) times daily. 90 tablet 1   Omega-3 Fatty Acids (FISH OIL) 1000 MG CAPS Take 1 capsule by mouth daily.     omeprazole (PRILOSEC) 40 MG capsule Omeprazole 40 mg by mouth twice a day for 4 weeks ,then back to 40 mg once a day if that controls reflux  symptoms 90 capsule 3   ondansetron (ZOFRAN-ODT) 4 MG disintegrating tablet Take 1 tablet (4 mg total) by mouth every 8 (eight) hours as needed for nausea or vomiting. 20 tablet 0   ondansetron (ZOFRAN-ODT) 4 MG disintegrating tablet Take 1 tablet (4 mg total) by mouth every 8 (eight) hours as needed for nausea or vomiting. 12 tablet 0   oxymetazoline (AFRIN NASAL SPRAY) 0.05 % nasal spray Place 1 spray into both nostrils 2 (two) times daily. 15 mL 0   predniSONE (DELTASONE) 50 MG tablet Take 1 tablet by mouth 13 hours prior to CT. Take 1 tablet by mouth 7 hours prior to CT. Take 1 tablet by mouth 1 hour prior to CT. (Patient not taking: Reported on 03/31/2023) 3 tablet 0   sucralfate (CARAFATE) 1 g tablet Take 1 tablet (1 g total) by mouth 4 (four) times daily -  with meals and at bedtime. 120 tablet 0   No current facility-administered medications for this visit.    ROS: Does not endorse any physical complaints  Objective:  Psychiatric Specialty Exam: There were no vitals taken for this visit.There is no height or weight on file to calculate BMI.  General Appearance: Casual and Well Groomed  Eye Contact:  Good  Speech:  Clear and Coherent and Normal Rate  Volume:  Normal  Mood:   "not good today"  Affect:   Full range - frustrated however able to brighten and laugh  Thought Content:  Denies AVH; IOR; paranoia    Suicidal Thoughts:  No  Homicidal Thoughts:  No  Thought Process:  Goal Directed and Linear  Orientation:  Full (Time, Place, and Person)    Memory:   Grossly intact  Judgment:  Good  Insight:  Good  Concentration:  Concentration: Good  Recall:  NA  Fund of Knowledge: Good  Language: Good   Psychomotor Activity:  Normal  Akathisia:  No  AIMS (if indicated): not done  Assets:  Communication Skills Desire for Improvement Housing Leisure Time Resilience Social Support Talents/Skills Transportation  ADL's:  Intact  Cognition: WNL  Sleep:  Good   PE: General: sits comfortably in view of camera; no acute distress  Pulm: no increased work of breathing on room air  MSK: all extremity movements appear intact  Neuro: no focal neurological deficits observed  Gait & Station: unable to assess by video    Metabolic Disorder Labs: Lab Results  Component Value Date   HGBA1C 5.6 12/11/2021   No results found for: "PROLACTIN" Lab Results  Component Value Date   CHOL 139 07/04/2022   TRIG 62 07/04/2022   HDL 37 (L) 07/04/2022   CHOLHDL 5.0 09/14/2021   VLDL 11 09/14/2021   LDLCALC 89 07/04/2022   LDLCALC 112 (H) 11/14/2021   No results found for: "TSH"  Therapeutic Level Labs: No results found for: "LITHIUM" No results found for: "VALPROATE" No results found for: "CBMZ"  Screenings:  GAD-7    Flowsheet Row Office Visit from 12/11/2021 in [facility] Video Visit from 10/02/2021 in [facility] Office Visit from 06/27/2021 in [facility] Office Visit from 04/19/2021 in [facility] Office Visit from 01/11/2021 in [facility]  Total GAD-7 Score 10 9 8 6  0      PHQ2-9    Flowsheet Row Office Visit from 12/11/2021 in [facility] Video Visit from 10/02/2021 in [facility] Office  Visit from 06/27/2021 in [facility] Office Visit from 04/19/2021 in [facility] Office Visit from 01/11/2021 in [facility]  PHQ-2 Total Score 4 1 1 1  0  PHQ-9 Total Score 6 -- -- -- 0      Flowsheet Row ED from 03/22/2023  in [facility] Emergency Department at [facility] ED from 02/14/2023 in [facility]) ED from 02/01/2023 in [facility] Emergency Department at [facility]  C-SSRS RISK CATEGORY No Risk No Risk No Risk       Collaboration of Care: Collaboration of Care: Medication Management AEB ongoing medication management, Psychiatrist AEB established with this provider, and Referral or follow-up with counselor/therapist AEB established with individual psychotherapy  Patient/Guardian was advised Release of Information must be obtained prior to any record release in order to collaborate their care with an outside provider. Patient/Guardian was advised if they have not already done so to contact the registration department to sign all necessary forms in order for [place] to release information regarding their care.   Consent: Patient/Guardian gives verbal consent for treatment and assignment of benefits for services provided during this visit. Patient/Guardian expressed understanding and agreed to proceed.   Televisit via video: I connected with patient on 04/28/23 at 11:00 AM EDT by a video enabled telemedicine application and verified that I am speaking with the correct person using two identifiers.  Location: Patient: home address in [place] Provider: remote office in [place]   I discussed the limitations of evaluation and management by telemedicine and the availability of in person appointments. The patient expressed understanding and agreed to proceed.  I discussed the assessment and treatment plan with the patient. The patient was provided an opportunity to ask questions and all were answered. The patient agreed with the plan and demonstrated an understanding of the instructions.   The patient was advised to call back or seek an in-person evaluation if the symptoms worsen or if the condition fails to improve as anticipated.  I provided 15 minutes dedicated  to the care of this patient via video on the date of this encounter to include chart review, face-to-face time with the patient, medication management/counseling.  Psychotherapy was utilized during today's session from 11:10AM to 11:30AM. Therapeutic interventions included empathic listening, supportive therapy, cognitive therapy. Used supportive interviewing techniques to provide emotional validation. Worked on cognitive reframing techniques.  Improvement was evidenced by patient's participation and identified commitment to therapy goals.   Samah Lapiana A Mystic Labo 04/28/2023, 11:53 AM

## 2023-04-28 ENCOUNTER — Telehealth (INDEPENDENT_AMBULATORY_CARE_PROVIDER_SITE_OTHER): Payer: Medicaid Other | Admitting: Psychiatry

## 2023-04-28 ENCOUNTER — Encounter (HOSPITAL_COMMUNITY): Payer: Self-pay | Admitting: Psychiatry

## 2023-04-28 DIAGNOSIS — F411 Generalized anxiety disorder: Secondary | ICD-10-CM | POA: Diagnosis not present

## 2023-04-28 DIAGNOSIS — F410 Panic disorder [episodic paroxysmal anxiety] without agoraphobia: Secondary | ICD-10-CM | POA: Diagnosis not present

## 2023-04-28 MED ORDER — MIRTAZAPINE 30 MG PO TABS: 30.0000 mg | ORAL_TABLET | Freq: Every evening | ORAL | 2 refills | Status: DC

## 2023-04-28 MED ORDER — CLONAZEPAM 0.5 MG PO TABS: 0.5000 mg | ORAL_TABLET | Freq: Three times a day (TID) | ORAL | 2 refills | Status: DC

## 2023-04-28 NOTE — Patient Instructions (Signed)

## 2023-05-09 ENCOUNTER — Ambulatory Visit: Admitting: Cardiology

## 2023-05-12 ENCOUNTER — Telehealth (HOSPITAL_COMMUNITY): Payer: Self-pay | Admitting: Mental Health

## 2023-05-12 ENCOUNTER — Ambulatory Visit (HOSPITAL_COMMUNITY): Payer: Medicaid Other | Admitting: Mental Health

## 2023-05-12 ENCOUNTER — Encounter (HOSPITAL_COMMUNITY): Payer: Self-pay

## 2023-05-12 NOTE — Telephone Encounter (Signed)
 Therapist sent link x 2 for tele-therapy session. Attempted to contact pt via telephone however unable to connect (phone rings once with no connection; x 2 attempts made)

## 2023-05-14 ENCOUNTER — Ambulatory Visit: Attending: Cardiology | Admitting: Cardiology

## 2023-05-15 ENCOUNTER — Other Ambulatory Visit (HOSPITAL_COMMUNITY): Payer: Self-pay

## 2023-06-23 ENCOUNTER — Telehealth (HOSPITAL_COMMUNITY): Payer: Self-pay

## 2023-06-23 NOTE — Telephone Encounter (Signed)
 Pt states that she is leaving to head out of town to [place]  and would like to pick up her klonopin  2-3 days early if possible. Next app is this 06/11.    JNL

## 2023-06-23 NOTE — Telephone Encounter (Signed)
 Provider has not seen this patient since 2023.  Message will be forwarded to Dr. Ulysess Gang.

## 2023-07-01 NOTE — Progress Notes (Unsigned)
 BH MD Outpatient Progress Note  07/02/2023 4:34 PM ALIZON SCHMELING  MRN:  [id number]  Assessment:  Erin Pollard presents for follow-up evaluation. Today, 07/02/23, patient reports ongoing heightened anxiety and episodes of overwhelm in face of numerous psychosocial and medical stressors. She reports frequent episodes of tearfulness and displacement of anxiety into other areas in her life impacting overall functioning. She identifies substantial benefit from Remeron  and is amenable to further titration although discussed that additional agent (SSRI vs. SNRI) may be required in the future. Introduced option of PHP/IOP referral for increased support however she declines for time being, preferring to focus on current individual psychotherapy.    RTC in approx. 7 weeks by video.  Identifying Information: Erin Pollard is a 53 y.o. female with a history of generalized anxiety disorder with panic attacks, HTN, past MI (2017), HLD, and IBS who is an established patient with [facility] participating in follow-up via video conferencing.   Plan:  # GAD with panic attacks Past medication trials: Zoloft, Valium (nausea), patient reports numerous others that requires further exploration Status of problem: chronic with moderate exacerbation Interventions: -- INCREASE mirtazapine  to 45 mg at bedtime (i6/11/25) -- Continue Klonopin  0.5 mg TID PRN panic attacks (patient using TID)  -- Risks of long-term use have been extensively discussed and patient expressed understanding and desire to continue as she finds it effective with absence of side effects  -- PDMP reviewed with appropriate filling -- Continue Vitamin D  supplementation by PCP for Vitamin D  deficiency -- Continue individual psychotherapy with Carmel Chimes, [facility] -- Offered referral to PHP/IOP for increased support however patient declines for time being  Patient was given contact information for behavioral health  clinic and was instructed to call 911 for emergencies.   Subjective:  Chief Complaint:  Chief Complaint  Patient presents with   Medication Management    Interval History:   Patient reports she got back from [place] a few days ago.   Has been dealing with vertigo and muscle pain. She does not feel vertigo is related to anxiety as it is triggered when she lays down. Has not seen a provider recently in regards to this. Will be seeing cardiologist tomorrow.   Has not been able to find a job as she is limited by health issues. Reports heightened anxiety in response to numerous settings: driving, going to the store, showering. Feels mostly at peace when at home. Panic attacks occur a few times per month and often triggered by heightened baseline anxiety. Sleeping and eating well.   Reports more frequent crying episodes related to overwhelm. However denies persistent feelings of depression and attributes to situational factors. Reports intermittent passive SI when acutely overwhelmed however denies active SI. Identifies kids and grandkids as protective factors. Finds spirituality and faith contribute to resiliency.   Checked in on perimenopausal symptoms; had period last month and last cycle had almost been a year prior to this. Denies hot flashes.   She feels Remeron  has been helpful for sleep, anxiety. Amenable to further titration.  Introduced option of IOP however patient declines and does not feel she needs daily support. Will continue to consider for the future.   PDMP: -- Klonopin  0.5 mg QTY 90 last filled 06/02/23 (rx dating back to 2021)  Visit Diagnosis:    ICD-10-CM   1. Generalized anxiety disorder with panic attacks  F41.1 clonazePAM  (KLONOPIN ) 0.5 MG tablet   F41.0 mirtazapine  (REMERON ) 45 MG tablet  Past Psychiatric History:  Diagnoses: GAD with panic attacks Medication trials: Zoloft, Valium (nausea), patient reports numerous others that requires further  exploration Hospitalizations: x1 in 1996 for suicide attempt Suicide attempts: x1 in 1997 via cutting Substance use: denies use of etoh, tobacco, or illicit drugs  -- Tobacco: quit smoking 2017  Past Medical History:  Past Medical History:  Diagnosis Date   Anxiety    Cervical radiculopathy    GERD (gastroesophageal reflux disease)    Hiatal hernia 08/28/2022   [facility] [place]  was found through a CT scan and fibroids and cyst on liver parenchyma   Hypertension    Myocardial infarction (HCC)    Panic attack     Past Surgical History:  Procedure Laterality Date   COLONOSCOPY     LEFT HEART CATH     TUBAL LIGATION     UPPER GASTROINTESTINAL ENDOSCOPY      Family Psychiatric History:  Reports 2 of her sons have struggled with substance use  Family History:  Family History  Problem Relation Age of Onset   Heart attack Mother    Cancer Father    Stroke Father    Stroke Sister    Heart disease Maternal Grandmother    Heart attack Maternal Grandmother    Heart disease Maternal Grandfather    Stroke Maternal Grandfather    Esophageal cancer Neg Hx    Colon cancer Neg Hx    Stomach cancer Neg Hx     Social History:  Social History   Socioeconomic History   Marital status: Divorced    Spouse name: Not on file   Number of children: 4   Years of education: Not on file   Highest education level: Associate degree: occupational, Scientist, product/process development, or vocational program  Occupational History   Not on file  Tobacco Use   Smoking status: Former    Current packs/day: 0.00    Types: Cigarettes    Quit date: 2017    Years since quitting: 8.4   Smokeless tobacco: Not on file  Vaping Use   Vaping status: Never Used  Substance and Sexual Activity   Alcohol use: Not Currently   Drug use: Never   Sexual activity: Yes  Other Topics Concern   Not on file  Social History Narrative   Not on file   Social Drivers of Health   Financial Resource Strain: Medium Risk  (07/23/2022)   Overall Financial Resource Strain (CARDIA)    Difficulty of Paying Living Expenses: Somewhat hard  Food Insecurity: Food Insecurity Present (07/23/2022)   Hunger Vital Sign    Worried About Running Out of Food in the Last Year: Sometimes true    Ran Out of Food in the Last Year: Sometimes true  Transportation Needs: No Transportation Needs (07/23/2022)   PRAPARE - Administrator, Civil Service (Medical): No    Lack of Transportation (Non-Medical): No  Physical Activity: Sufficiently Active (07/23/2022)   Exercise Vital Sign    Days of Exercise per Week: 5 days    Minutes of Exercise per Session: 30 min  Stress: No Stress Concern Present (07/23/2022)   [employer] of Occupational Health - Occupational Stress Questionnaire    Feeling of Stress : Only a little  Social Connections: Moderately Isolated (07/23/2022)   Social Connection and Isolation Panel [NHANES]    Frequency of Communication with Friends and Family: More than three times a week    Frequency of Social Gatherings with Friends and Family: More than  three times a week    Attends Religious Services: 1 to 4 times per year    Active Member of Clubs or Organizations: No    Attends Banker Meetings: Not on file    Marital Status: Divorced    Allergies:  Allergies  Allergen Reactions   Amlodipine Hives    Achy legs and s.o.b Achy legs and s.o.b    Hydrocodone-Acetaminophen  Hives and Rash   Iodinated Contrast Media Hives   Iodine Hives and Rash   Codeine    Hydrocortisone-Iodoquinol    Propoxyphene     Current Medications: Current Outpatient Medications  Medication Sig Dispense Refill   cholecalciferol (VITAMIN D3) 25 MCG (1000 UNIT) tablet Take 1,000 Units by mouth daily.     KLOR-CON  M10 10 MEQ tablet Take 1 tablet (10 mEq total) by mouth daily. 90 tablet 1   atorvastatin  (LIPITOR) 40 MG tablet Take 1 tablet (40 mg total) by mouth at bedtime. 90 tablet 1   clonazePAM  (KLONOPIN ) 0.5  MG tablet Take 1 tablet (0.5 mg total) by mouth 3 (three) times daily. 90 tablet 2   dicyclomine  (BENTYL ) 10 MG capsule Take 1 capsule (10 mg total) by mouth every 6 (six) hours as needed for spasms (cramps). 70 capsule 1   diphenhydrAMINE  (BENADRYL  ALLERGY) 25 MG tablet Take 2 tablets (50 mg total) by mouth as directed. 1 hour prior to your CT. 30 tablet 0   Eluxadoline  (VIBERZI ) 100 MG TABS Take 1 tablet (100 mg total) by mouth 2 (two) times daily before a meal. (Patient not taking: Reported on 03/31/2023) 60 tablet 3   famotidine  (PEPCID ) 20 MG tablet Take 20 mg by mouth as needed. (Patient not taking: Reported on 03/31/2023)     ferrous sulfate  325 (65 FE) MG EC tablet TAKE 1 TABLET BY MOUTH EVERY DAY 90 tablet 0   hydrALAZINE  (APRESOLINE ) 25 MG tablet Take 1 tablet (25 mg total) by mouth 3 (three) times daily. 270 tablet 3   hydrochlorothiazide  (MICROZIDE ) 12.5 MG capsule Take 1 capsule (12.5 mg total) by mouth daily. 90 capsule 1   labetalol  (NORMODYNE ) 100 MG tablet Take 1 tablet (100 mg total) by mouth 2 (two) times daily. 180 tablet 1   lipase/protease/amylase (CREON ) 36000 UNITS CPEP capsule Take 2 capsules (72,000 Units total) by mouth 3 (three) times daily with meals. May also take 1 capsule (36,000 Units total) as needed (with snacks - up to 2 snacks daily). 300 capsule 1   loratadine  (CLARITIN ) 10 MG tablet Take 1 tablet (10 mg total) by mouth daily. 30 tablet 6   losartan  (COZAAR ) 50 MG tablet Take 1 tablet (50 mg total) by mouth daily. 90 tablet 1   methocarbamol  (ROBAXIN ) 500 MG tablet Take 1 tablet (500 mg total) by mouth 2 (two) times daily. (Patient not taking: Reported on 03/31/2023) 20 tablet 0   mirtazapine  (REMERON ) 45 MG tablet Take 1 tablet (45 mg total) by mouth at bedtime. 30 tablet 2   naproxen  (NAPROSYN ) 500 MG tablet Take 1 tablet (500 mg total) by mouth 2 (two) times daily. 90 tablet 1   Omega-3 Fatty Acids (FISH OIL) 1000 MG CAPS Take 1 capsule by mouth daily.      omeprazole  (PRILOSEC) 40 MG capsule Omeprazole  40 mg by mouth twice a day for 4 weeks ,then back to 40 mg once a day if that controls reflux symptoms 90 capsule 3   ondansetron  (ZOFRAN -ODT) 4 MG disintegrating tablet Take 1 tablet (4 mg total) by  mouth every 8 (eight) hours as needed for nausea or vomiting. 20 tablet 0   ondansetron  (ZOFRAN -ODT) 4 MG disintegrating tablet Take 1 tablet (4 mg total) by mouth every 8 (eight) hours as needed for nausea or vomiting. 12 tablet 0   oxymetazoline  (AFRIN NASAL SPRAY) 0.05 % nasal spray Place 1 spray into both nostrils 2 (two) times daily. 15 mL 0   predniSONE  (DELTASONE ) 50 MG tablet Take 1 tablet by mouth 13 hours prior to CT. Take 1 tablet by mouth 7 hours prior to CT. Take 1 tablet by mouth 1 hour prior to CT. (Patient not taking: Reported on 03/31/2023) 3 tablet 0   sucralfate  (CARAFATE ) 1 g tablet Take 1 tablet (1 g total) by mouth 4 (four) times daily -  with meals and at bedtime. 120 tablet 0   No current facility-administered medications for this visit.    ROS: Does not endorse any physical complaints  Objective:  Psychiatric Specialty Exam: There were no vitals taken for this visit.There is no height or weight on file to calculate BMI.  General Appearance: Casual and Well Groomed  Eye Contact:  Good  Speech:  Clear and Coherent and Normal Rate  Volume:  Normal  Mood:  overwhelmed  Affect:  Full range - frustrated however able to brighten and laugh  Thought Content: Denies AVH; IOR; paranoia   Suicidal Thoughts:  Reports passive SI when acutely overwhelmed; denies active SI  Homicidal Thoughts:  No  Thought Process:  Goal Directed and Linear  Orientation:  Full (Time, Place, and Person)    Memory:  Grossly intact  Judgment:  Good  Insight:  Good  Concentration:  Concentration: Good  Recall:  NA  Fund of Knowledge: Good  Language: Good  Psychomotor Activity:  Normal  Akathisia:  No  AIMS (if indicated): not done  Assets:   Communication Skills Desire for Improvement Housing Leisure Time Resilience Social Support Talents/Skills Transportation  ADL's:  Intact  Cognition: WNL  Sleep:  Good   PE: General: sits comfortably in view of camera; no acute distress  Pulm: no increased work of breathing on room air  MSK: all extremity movements appear intact  Neuro: no focal neurological deficits observed  Gait & Station: unable to assess by video    Metabolic Disorder Labs: Lab Results  Component Value Date   HGBA1C 5.6 12/11/2021   No results found for: PROLACTIN Lab Results  Component Value Date   CHOL 139 07/04/2022   TRIG 62 07/04/2022   HDL 37 (L) 07/04/2022   CHOLHDL 5.0 09/14/2021   VLDL 11 09/14/2021   LDLCALC 89 07/04/2022   LDLCALC 112 (H) 11/14/2021   No results found for: TSH  Therapeutic Level Labs: No results found for: LITHIUM No results found for: VALPROATE No results found for: CBMZ  Screenings:  GAD-7    Flowsheet Row Office Visit from 12/11/2021 in [facility] Video Visit from 10/02/2021 in [facility] Office Visit from 06/27/2021 in [facility] Office Visit from 04/19/2021 in [facility] Office Visit from 01/11/2021 in [facility]  Total GAD-7 Score 10 9 8 6  0      PHQ2-9    Flowsheet Row Office Visit from 12/11/2021 in [facility] Video Visit from 10/02/2021 in [facility] Office Visit from 06/27/2021 in [facility] Office Visit from 04/19/2021 in [facility] Office Visit from 01/11/2021 in [facility]  PHQ-2 Total Score 4 1 1 1  0  PHQ-9 Total Score 6 -- -- -- 0      Flowsheet Row ED from 03/22/2023 in [facility] at [facility] from 02/14/2023 in [facility] at [facility] ED from 02/01/2023 in [facility] Emergency Department at [facility]  C-SSRS RISK CATEGORY No Risk No Risk No Risk       Collaboration of Care: Collaboration of Care: Medication Management AEB ongoing medication management, Psychiatrist AEB established with this provider, and Referral or follow-up with counselor/therapist AEB established with individual psychotherapy  Patient/Guardian was advised Release of Information must be obtained prior to any record release in order to collaborate their care with an outside provider. Patient/Guardian was advised if they have not already done so to contact the registration department to sign all necessary forms in order for us  to release information regarding their care.   Consent: Patient/Guardian gives verbal consent for treatment and assignment of benefits for services provided during this visit. Patient/Guardian expressed understanding and agreed to proceed.   Televisit via video: I connected with patient on 07/02/23 at  3:30 PM EDT by a video enabled telemedicine application and verified that I am speaking with the correct person using two identifiers.  Location: Patient: home address in [place] Provider: remote office in [place]   I discussed the limitations of evaluation and management by telemedicine and the availability of in person appointments. The patient expressed understanding and agreed to proceed.  I discussed the assessment and treatment plan with the patient. The patient was provided an opportunity to ask questions and all were answered. The patient agreed with the plan and demonstrated an understanding of the instructions.   The patient was advised to call back or seek an in-person evaluation if the symptoms worsen or if the condition fails to improve as anticipated.  I provided 15 minutes dedicated to the care of this patient via video on the date of this encounter to include chart  review, face-to-face time with the patient, medication management/counseling.  Psychotherapy was utilized during today's session from 3:05PM-3:25PM. Therapeutic interventions included empathic listening, supportive therapy, cognitive therapy. Used supportive interviewing techniques to provide emotional validation. Worked on cognitive reframing techniques.  Improvement was evidenced by patient's participation and identified commitment to therapy goals.   Ellakate Gonsalves A Jaryn Rosko 07/02/2023, 4:34 PM

## 2023-07-02 ENCOUNTER — Telehealth (HOSPITAL_COMMUNITY): Admitting: Psychiatry

## 2023-07-02 ENCOUNTER — Encounter (HOSPITAL_COMMUNITY): Payer: Self-pay | Admitting: Psychiatry

## 2023-07-02 DIAGNOSIS — F411 Generalized anxiety disorder: Secondary | ICD-10-CM | POA: Diagnosis not present

## 2023-07-02 DIAGNOSIS — F410 Panic disorder [episodic paroxysmal anxiety] without agoraphobia: Secondary | ICD-10-CM

## 2023-07-02 MED ORDER — MIRTAZAPINE 45 MG PO TABS: 45.0000 mg | ORAL_TABLET | Freq: Every evening | ORAL | 2 refills | Status: DC

## 2023-07-02 MED ORDER — CLONAZEPAM 0.5 MG PO TABS: 0.5000 mg | ORAL_TABLET | Freq: Three times a day (TID) | ORAL | 2 refills | Status: DC

## 2023-07-02 NOTE — Patient Instructions (Signed)
 Thank you for attending your appointment today.  -- INCREASE Remeron  to 45 mg nightly -- Continue other medications as prescribed.  Please do not make any changes to medications without first discussing with your provider. If you are experiencing a psychiatric emergency, please call 911 or present to your nearest emergency department. Additional crisis, medication management, and therapy resources are included below.  [facility]  [address], [place], [place] [address] [phone] WALK-IN URGENT CARE 24/7 FOR ANYONE [address] [place], [place]  [phone] Fax: [phone] [email or web address] *Interpreters available *Accepts all insurance and uninsured for Urgent Care needs *Accepts Medicaid and uninsured for outpatient treatment (below)      ONLY FOR [facility]  Below:    Outpatient New Patient Assessment/Therapy Walk-ins:        Monday, Wednesday, and Thursday 8am until slots are full (first come, first served)                   New Patient Psychiatry/Medication Management        Monday-Friday 8am-11am (first come, first served)               For all walk-ins we ask that you arrive by 7:15am, because patients will be seen in the order of arrival.

## 2023-07-03 ENCOUNTER — Ambulatory Visit: Attending: Cardiology | Admitting: Cardiology

## 2023-07-03 ENCOUNTER — Encounter: Payer: Self-pay | Admitting: Cardiology

## 2023-07-03 VITALS — BP 134/80 | HR 65 | Resp 16 | Ht 60.0 in | Wt 158.0 lb

## 2023-07-03 DIAGNOSIS — H0264 Xanthelasma of left upper eyelid: Secondary | ICD-10-CM | POA: Insufficient documentation

## 2023-07-03 DIAGNOSIS — H0261 Xanthelasma of right upper eyelid: Secondary | ICD-10-CM | POA: Insufficient documentation

## 2023-07-03 DIAGNOSIS — I10 Essential (primary) hypertension: Secondary | ICD-10-CM | POA: Insufficient documentation

## 2023-07-03 DIAGNOSIS — E7800 Pure hypercholesterolemia, unspecified: Secondary | ICD-10-CM | POA: Diagnosis not present

## 2023-07-03 NOTE — Patient Instructions (Signed)
 Medication Instructions:  No medication changes were made at this visit. Continue current regimen.   *If you need a refill on your cardiac medications before your next appointment, please call your pharmacy*  Lab Work: To be completed tomorrow: FASTING lipid panel and CMP  If you have labs (blood work) drawn today and your tests are completely normal, you will receive your results only by: MyChart Message (if you have MyChart) OR A paper copy in the mail If you have any lab test that is abnormal or we need to change your treatment, we will call you to review the results.  Testing/Procedures: None ordered today.  Follow-Up: At [facility], you and your health needs are our priority.  As part of our continuing mission to provide you with exceptional heart care, our providers are all part of one team.  This team includes your primary Cardiologist (physician) and Advanced Practice Providers or APPs (Physician Assistants and Nurse Practitioners) who all work together to provide you with the care you need, when you need it.  Your next appointment:   1 year(s)  Provider:   Olinda Bertrand, DO

## 2023-07-03 NOTE — Progress Notes (Signed)
 Cardiology Office Note:  .   Date:  07/03/2023  ID:  Eliberto Grosser, DOB January 21, 1971, MRN [id number] PCP:  Marius Siemens, NP  Former Cardiology Providers: Dr. Darlis Eisenmenger [facility], [place])  [facility] Providers Cardiologist:  Olinda Bertrand, DO , [facility] (established care 02/01/2021) Electrophysiologist:  None  Click to update primary MD,subspecialty MD or APP then REFRESH:1}    Chief Complaint  Patient presents with   Pure hypercholesterolemia   Follow-up    History of Present Illness: .   Erin Pollard is a 53 y.o. African-American female whose past medical history and cardiovascular risk factors includes: Panic attack, anxiety, hyperlipidemia/familial hypercholesterolemia, history of myocardial infarction (04/2015) no intervention, family history of premature CAD, benign essential hypertension, GERD, former smoker, obesity due to excess calories.   Patient was referred to practice back in 2023 for chest pain evaluation.  She went appropriate workup as outlined below.  On physical examination she was noted to have xanthelasmas and a total cholesterol was noted to be 223 and LDL at 158 mg/dL.  Once her lipid management was uptitrated lipids and xanthelasmas have improved.  She has had a history of elevated AST and ALT but as of September 2024 her LFTs are back to normal limits.  In the past patient had endorsed that she did not want to be on injectable medication such as PCSK9 inhibitors.  She was started on Nexlizet  which did well for her; however, was having side effects of diarrhea and unable to tolerate it long-term.  She is no longer on Nexlizet .  She has also been intolerant to Crestor  in the past.  Currently tolerating 40 mg of Lipitor p.o. daily no recent labs available for review.  Denies anginal chest pain or heart failure symptoms.  Continues to exercise at least 20 minutes 3 times a week.  Family history of premature CAD with mother having a myocardial  infarction at the age of 18 and passed.  Review of Systems: .   Review of Systems  Cardiovascular:  Negative for chest pain, claudication, irregular heartbeat, leg swelling, near-syncope, orthopnea, palpitations, paroxysmal nocturnal dyspnea and syncope.  Respiratory:  Negative for shortness of breath.   Hematologic/Lymphatic: Negative for bleeding problem.    Studies Reviewed:   EKG: EKG Interpretation Date/Time:  Thursday July 03 2023 11:23:41 EDT Ventricular Rate:  61 PR Interval:  164 QRS Duration:  80 QT Interval:  424 QTC Calculation: 426 R Axis:   12  Text Interpretation: Normal sinus rhythm Cannot rule out Anterior infarct , age undetermined When compared with ECG of 22-Mar-2023 16:27, No significant change since last tracing Confirmed by Olinda Bertrand [phone]) on 07/03/2023 11:43:52 AM  Echocardiogram: 05/10/2022:  Normal LV systolic function with visual EF 60-65%. Left ventricle cavity is normal in size. Normal left ventricular wall thickness. Normal global wall motion. Normal diastolic filling pattern, normal LAP. Calculated EF 66%.  Structurally normal tricuspid valve with no regurgitation. No evidence of pulmonary hypertension.  No significant change compared to 02/2021.    Stress Testing: Exercise nuclear stress test 04/02/2021: Normal ECG stress. The patient exercised for 5 minutes and 0 seconds of a Bruce protocol, achieving approximately 7.05 METs. Exercise capacity reduced. The blood pressure response was normal. Myocardial perfusion is normal. Overall LV systolic function is normal without regional wall motion abnormalities. Stress LV EF: 69%.  No previous exam available for comparison. Low risk.  RADIOLOGY: NA  Risk Assessment/Calculations:   NA   Labs:  Latest Ref Rng & Units 03/22/2023    4:49 PM 05/06/2022    7:20 PM 03/27/2022    6:10 PM  CBC  WBC 4.0 - 10.5 K/uL 4.0  5.1  6.2   Hemoglobin 12.0 - 15.0 g/dL 40.9  81.1  91.4   Hematocrit 36.0 -  46.0 % 39.8  34.6  36.7   Platelets 150 - 400 K/uL 319  340  328        Latest Ref Rng & Units 03/31/2023    3:42 PM 03/22/2023    4:49 PM 10/04/2022    8:40 AM  BMP  Glucose 70 - 99 mg/dL 92  88  782   BUN 6 - 24 mg/dL 11  10  9    Creatinine 0.57 - 1.00 mg/dL 9.56  2.13  0.86   BUN/Creat Ratio 9 - 23 14   12    Sodium 134 - 144 mmol/L 141  139  139   Potassium 3.5 - 5.2 mmol/L 4.5  2.8  3.8   Chloride 96 - 106 mmol/L 98  102  101   CO2 20 - 29 mmol/L 25  27  25    Calcium  8.7 - 10.2 mg/dL 57.8  9.8  9.6       Latest Ref Rng & Units 03/31/2023    3:42 PM 03/22/2023    4:49 PM 10/04/2022    8:40 AM  CMP  Glucose 70 - 99 mg/dL 92  88  469   BUN 6 - 24 mg/dL 11  10  9    Creatinine 0.57 - 1.00 mg/dL 6.29  5.28  4.13   Sodium 134 - 144 mmol/L 141  139  139   Potassium 3.5 - 5.2 mmol/L 4.5  2.8  3.8   Chloride 96 - 106 mmol/L 98  102  101   CO2 20 - 29 mmol/L 25  27  25    Calcium  8.7 - 10.2 mg/dL 24.4  9.8  9.6   Total Protein 6.0 - 8.5 g/dL 7.3  8.2  6.7   Total Bilirubin 0.0 - 1.2 mg/dL <0.1  0.5  0.3   Alkaline Phos 44 - 121 IU/L 105  84  107   AST 0 - 40 IU/L 17  22  21    ALT 0 - 32 IU/L 14  19  27      Lab Results  Component Value Date   CHOL 139 07/04/2022   HDL 37 (L) 07/04/2022   LDLCALC 89 07/04/2022   LDLDIRECT 84 07/04/2022   TRIG 62 07/04/2022   CHOLHDL 5.0 09/14/2021   No results for input(s): LIPOA in the last 8760 hours. No components found for: NTPROBNP No results for input(s): PROBNP in the last 8760 hours. No results for input(s): TSH in the last 8760 hours.  Physical Exam:    Today's Vitals   07/03/23 1120  BP: 134/80  Pulse: 65  Resp: 16  SpO2: 96%  Weight: 158 lb (71.7 kg)  Height: 5' (1.524 m)   Body mass index is 30.86 kg/m. Wt Readings from Last 3 Encounters:  07/03/23 158 lb (71.7 kg)  03/31/23 149 lb 12.8 oz (67.9 kg)  03/22/23 161 lb (73 kg)    Physical Exam  Constitutional: No distress.  hemodynamically stable  Eyes:   Bilateral upper lid xanthelasmas are improving   Neck: No JVD present.  Cardiovascular: Normal rate, regular rhythm, S1 normal and S2 normal. Exam reveals no gallop, no S3 and no S4.  No murmur heard. Pulmonary/Chest:  Effort normal and breath sounds normal. No stridor. She has no wheezes. She has no rales.  Musculoskeletal:        General: No edema.     Cervical back: Neck supple.  Skin: Skin is warm.    Impression & Recommendation(s):  Impression:   ICD-10-CM   1. Pure hypercholesterolemia  E78.00 Comprehensive metabolic panel with GFR    Lipid panel    Lipid panel    Comprehensive metabolic panel with GFR    2. 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 Has been intolerant to Crestor  in the past. Tried Nexlizet  but discontinued it secondary to side effect profile-increased diarrhea Currently on maximally tolerated dose of Lipitor 40 mg p.o. nightly. Will check fasting lipids. If LDL levels are not well-controlled patient is now willing to be on PCSK9 inhibitors i.e. injectable medications if clinically indicated If lipids are not well-controlled we will refer her to Pharm.D. clinic for further medication titration and initiation of PCSK9 inhibitors  Benign hypertension Office blood pressures are acceptable. Continue hydralazine  25 mg p.o. 3 times daily. Continue hydrochlorothiazide  12.5 mg p.o. daily. Continue labetalol  100 mg p.o. twice daily. Continue losartan  50 mg p.o. twice daily Reemphasized importance of low-salt diet. Cardiology following peripherally, managed by primary care provider. Would recommend uptitration of hydrochlorothiazide  and losartan  and  weaning her off of hydralazine .  This will help consolidate overall pill burden.  Will defer to PCP  Orders Placed:  Orders Placed This Encounter  Procedures   Comprehensive metabolic panel with GFR    Standing Status:   Future    Number of Occurrences:   1    Expected Date:   07/04/2023    Expiration Date:   07/02/2024   Lipid panel    Standing Status:   Future    Number of Occurrences:   1    Expected Date:   07/04/2023    Expiration Date:   07/02/2024   EKG 12-Lead     Final Medication List:   No orders of the defined types were placed in this encounter.   There are no discontinued medications.   Current Outpatient Medications:    atorvastatin  (LIPITOR) 40 MG tablet, Take 1 tablet (40 mg total) by mouth at bedtime., Disp: 90 tablet, Rfl: 1   cholecalciferol (VITAMIN D3) 25 MCG (1000 UNIT) tablet, Take 1,000 Units by mouth daily., Disp: , Rfl:    clonazePAM  (KLONOPIN ) 0.5 MG tablet, Take 1 tablet (0.5 mg total) by mouth 3 (three) times daily., Disp: 90 tablet, Rfl: 2   dicyclomine  (BENTYL ) 10 MG capsule, Take 1 capsule (10 mg total) by mouth every 6 (six) hours as needed for spasms (cramps)., Disp: 70 capsule, Rfl: 1   Eluxadoline  (VIBERZI ) 100 MG TABS, Take 1 tablet (100 mg total) by mouth 2 (two) times daily before a meal., Disp: 60 tablet, Rfl: 3   famotidine  (PEPCID ) 20 MG tablet, Take 20 mg by mouth as needed., Disp: , Rfl:    ferrous sulfate  325 (65 FE) MG EC tablet, TAKE 1 TABLET BY MOUTH EVERY DAY, Disp: 90 tablet, Rfl: 0   hydrALAZINE  (APRESOLINE ) 25 MG tablet, Take 1 tablet (25 mg total)  by mouth 3 (three) times daily., Disp: 270 tablet, Rfl: 3   hydrochlorothiazide  (MICROZIDE ) 12.5 MG capsule, Take 1 capsule (12.5 mg total) by mouth daily., Disp: 90 capsule, Rfl: 1   KLOR-CON  M10 10 MEQ tablet, Take 1 tablet (10 mEq total) by mouth daily., Disp: 90 tablet, Rfl: 1   labetalol  (NORMODYNE ) 100 MG tablet, Take 1 tablet (100 mg total) by mouth 2 (two) times daily., Disp: 180  tablet, Rfl: 1   lipase/protease/amylase (CREON ) 36000 UNITS CPEP capsule, Take 2 capsules (72,000 Units total) by mouth 3 (three) times daily with meals. May also take 1 capsule (36,000 Units total) as needed (with snacks - up to 2 snacks daily)., Disp: 300 capsule, Rfl: 1   loratadine  (CLARITIN ) 10 MG tablet, Take 1 tablet (10 mg total) by mouth daily. (Patient taking differently: Take 10 mg by mouth daily as needed.), Disp: 30 tablet, Rfl: 6   losartan  (COZAAR ) 50 MG tablet, Take 1 tablet (50 mg total) by mouth daily., Disp: 90 tablet, Rfl: 1   methocarbamol  (ROBAXIN ) 500 MG tablet, Take 1 tablet (500 mg total) by mouth 2 (two) times daily. (Patient taking differently: Take 500 mg by mouth daily as needed.), Disp: 20 tablet, Rfl: 0   mirtazapine  (REMERON ) 45 MG tablet, Take 1 tablet (45 mg total) by mouth at bedtime., Disp: 30 tablet, Rfl: 2   naproxen  (NAPROSYN ) 500 MG tablet, Take 1 tablet (500 mg total) by mouth 2 (two) times daily. (Patient taking differently: Take 500 mg by mouth as needed.), Disp: 90 tablet, Rfl: 1   Omega-3 Fatty Acids (FISH OIL) 1000 MG CAPS, Take 1 capsule by mouth daily., Disp: , Rfl:    omeprazole  (PRILOSEC) 40 MG capsule, Omeprazole  40 mg by mouth twice a day for 4 weeks ,then back to 40 mg once a day if that controls reflux symptoms, Disp: 90 capsule, Rfl: 3   ondansetron  (ZOFRAN -ODT) 4 MG disintegrating tablet, Take 1 tablet (4 mg total) by mouth every 8 (eight) hours as needed for nausea or vomiting., Disp: 20 tablet, Rfl: 0   ondansetron  (ZOFRAN -ODT) 4 MG disintegrating tablet, Take 1 tablet (4 mg total) by mouth every 8 (eight) hours as needed for nausea or vomiting., Disp: 12 tablet, Rfl: 0   oxymetazoline  (AFRIN NASAL SPRAY) 0.05 % nasal spray, Place 1 spray into both nostrils 2 (two) times daily., Disp: 15 mL, Rfl: 0   predniSONE  (DELTASONE ) 50 MG tablet, Take 1 tablet by mouth 13 hours prior to CT. Take 1 tablet by mouth 7 hours prior to CT. Take 1 tablet by mouth  1 hour prior to CT., Disp: 3 tablet, Rfl: 0   sucralfate  (CARAFATE ) 1 g tablet, Take 1 tablet (1 g total) by mouth 4 (four) times daily -  with meals and at bedtime., Disp: 120 tablet, Rfl: 0   diphenhydrAMINE  (BENADRYL  ALLERGY) 25 MG tablet, Take 2 tablets (50 mg total) by mouth as directed. 1 hour prior to your CT., Disp: 30 tablet, Rfl: 0  Consent:   NA  Disposition:   1 year follow-up sooner if needed  Her questions and concerns were addressed to her satisfaction. She voices understanding of the recommendations provided during this encounter.    Signed, Awilda Bogus, [facility]  A Division of [facility] [address], [place], [place] [address]  [place], [place] [address] 07/03/2023 12:49 PM

## 2023-07-24 ENCOUNTER — Ambulatory Visit: Payer: Self-pay

## 2023-07-24 NOTE — Telephone Encounter (Signed)
 Please reach out to pt and see if we can move 7/17 appt to sooner

## 2023-07-24 NOTE — Telephone Encounter (Signed)
 Noted

## 2023-07-24 NOTE — Telephone Encounter (Signed)
 Copied from CRM [id number]. Topic: Clinical - Red Word Triage >> Jul 24, 2023 12:07 PM Rosaria BRAVO wrote: Red Word that prompted transfer to Nurse Triage: Severe pain, excruciating    ----------------------------------------------------------------------- From previous Reason for Contact - Scheduling: Patient/patient representative is calling to schedule an appointment. Refer to attachments for appointment information.    FYI Only or Action Required?: FYI only for provider.  Patient was last seen in primary care on 03/31/2023 by Celestia Rosaline SQUIBB, NP. Called Nurse Triage reporting Generalized Body Aches. Symptoms began several years ago. Interventions attempted: Rest, hydration, or home remedies. Symptoms are: gradually worsening.  Triage Disposition: See PCP Within 2 Weeks  Patient/caregiver understands and will follow disposition?: Yes   Patient reports she will go to the ED if her symptoms worsen prior to her appointment.     Reason for Disposition  Body pains are a chronic symptom (recurrent or ongoing AND present > 4 weeks)  Answer Assessment - Initial Assessment Questions 1. ONSET: When did the muscle aches or body pains start?      Constant pain for years  2. LOCATION: What part of your body is hurting? (e.g., entire body, arms, legs)      Entire body  3. SEVERITY: How bad is the pain? (Scale 1-10; or mild, moderate, severe)   - MILD (1-3): doesn't interfere with normal activities    - MODERATE (4-7): interferes with normal activities or awakens from sleep    - SEVERE (8-10):  excruciating pain, unable to do any normal activities      Moderate to severe   4. CAUSE: What do you think is causing the pains?     Unsure  5. FEVER: Have you been having fever?     No 6. OTHER SYMPTOMS: Do you have any other symptoms? (e.g., chest pain, weakness, rash, cold or flu symptoms, weight loss)     Nausea, vertigo  Protocols used: Muscle Aches and Body Pain-A-AH

## 2023-07-28 DIAGNOSIS — E7800 Pure hypercholesterolemia, unspecified: Secondary | ICD-10-CM | POA: Diagnosis not present

## 2023-07-28 LAB — LIPID PANEL
Chol/HDL Ratio: 4.4 ratio (ref 0.0–4.4)
Cholesterol, Total: 240 mg/dL — ABNORMAL HIGH (ref 100–199)
HDL: 55 mg/dL (ref >39)
LDL Chol Calc (NIH): 160 mg/dL — ABNORMAL HIGH (ref 0–99)
Triglycerides: 138 mg/dL (ref 0–149)
VLDL Cholesterol Cal: 25 mg/dL (ref 5–40)

## 2023-07-28 LAB — COMPREHENSIVE METABOLIC PANEL WITH GFR
ALT: 18 IU/L (ref 0–32)
AST: 17 IU/L (ref 0–40)
Albumin: 4.4 g/dL (ref 3.8–4.9)
Alkaline Phosphatase: 108 IU/L (ref 44–121)
BUN/Creatinine Ratio: 21 (ref 9–23)
BUN: 14 mg/dL (ref 6–24)
Bilirubin Total: <0.2 mg/dL (ref 0.0–1.2)
CO2: 25 mmol/L (ref 20–29)
Calcium: 9.7 mg/dL (ref 8.7–10.2)
Chloride: 100 mmol/L (ref 96–106)
Creatinine, Ser: 0.68 mg/dL (ref 0.57–1.00)
Globulin, Total: 2.6 g/dL (ref 1.5–4.5)
Glucose: 97 mg/dL (ref 70–99)
Potassium: 4.5 mmol/L (ref 3.5–5.2)
Sodium: 140 mmol/L (ref 134–144)
Total Protein: 7 g/dL (ref 6.0–8.5)
eGFR: 103 mL/min/1.73 (ref >59)

## 2023-07-29 ENCOUNTER — Telehealth (INDEPENDENT_AMBULATORY_CARE_PROVIDER_SITE_OTHER): Payer: Self-pay

## 2023-07-29 ENCOUNTER — Encounter (INDEPENDENT_AMBULATORY_CARE_PROVIDER_SITE_OTHER): Payer: Self-pay | Admitting: Primary Care

## 2023-07-29 ENCOUNTER — Ambulatory Visit (INDEPENDENT_AMBULATORY_CARE_PROVIDER_SITE_OTHER): Admitting: Primary Care

## 2023-07-29 ENCOUNTER — Ambulatory Visit: Payer: Self-pay | Admitting: Cardiology

## 2023-07-29 VITALS — BP 120/80 | HR 73 | Resp 16 | Wt 163.0 lb

## 2023-07-29 DIAGNOSIS — M791 Myalgia, unspecified site: Secondary | ICD-10-CM | POA: Diagnosis not present

## 2023-07-29 DIAGNOSIS — G90511 Complex regional pain syndrome I of right upper limb: Secondary | ICD-10-CM | POA: Diagnosis not present

## 2023-07-29 DIAGNOSIS — F410 Panic disorder [episodic paroxysmal anxiety] without agoraphobia: Secondary | ICD-10-CM | POA: Diagnosis not present

## 2023-07-29 DIAGNOSIS — F411 Generalized anxiety disorder: Secondary | ICD-10-CM | POA: Diagnosis not present

## 2023-07-29 DIAGNOSIS — M5412 Radiculopathy, cervical region: Secondary | ICD-10-CM | POA: Insufficient documentation

## 2023-07-29 DIAGNOSIS — M47812 Spondylosis without myelopathy or radiculopathy, cervical region: Secondary | ICD-10-CM | POA: Insufficient documentation

## 2023-07-29 DIAGNOSIS — G5621 Lesion of ulnar nerve, right upper limb: Secondary | ICD-10-CM | POA: Diagnosis not present

## 2023-07-29 DIAGNOSIS — R112 Nausea with vomiting, unspecified: Secondary | ICD-10-CM | POA: Diagnosis not present

## 2023-07-29 DIAGNOSIS — H8113 Benign paroxysmal vertigo, bilateral: Secondary | ICD-10-CM

## 2023-07-29 DIAGNOSIS — G9050 Complex regional pain syndrome I, unspecified: Secondary | ICD-10-CM | POA: Insufficient documentation

## 2023-07-29 DIAGNOSIS — E7800 Pure hypercholesterolemia, unspecified: Secondary | ICD-10-CM

## 2023-07-29 NOTE — Telephone Encounter (Signed)
 Patient is following up regarding message from Dr. Michele.

## 2023-07-29 NOTE — Assessment & Plan Note (Signed)
No diagnosis

## 2023-07-29 NOTE — Telephone Encounter (Signed)
 Copied from CRM [id number]. Topic: General - Transportation >> Jul 28, 2023 11:46 AM Shelba HERO wrote: Reason for CRM: Patient Erin Pollard called in to let us  know that the transportation that she got scheduled for herself to her appointment tomorrow at 2:10pm so how her transportation was cancelled because a worker name Mylinda stated to [place] from [employer] stated that this appointment was not an emergency or urgent and that her appointment for transportation can be scheduled on a different day which was unacceptable. I was able to call the Transportation company and get the patient [name] back on track for her appointment that is set for tomorrow.

## 2023-07-29 NOTE — Progress Notes (Signed)
 [facility]  Erin Pollard, is a 53 y.o. female  RDW:[id number]  FMW:[id number]  DOB - 02-14-1970  Chief Complaint  Patient presents with   Pain    Generalized Body Aches. Symptoms began several years ago. Interventions attempted: Rest, hydration, or home remedies. Symptoms are: gradually worsening.  Pt states her is inflammed   Nausea    Taking zofran  with no relief    Dizziness       Subjective:   Erin Pollard is a 53 y.o. female here today for an acute visit. 2022 fell and broke right wrist and arm. Tx in [place] -she has records that will be forwards and placed in her chart.  Major complaint is she is in pain from head to toe every day yes.  Some days she is not able to get enough energy or strength to get out of the bed.  She is also finding difficulty using her right hand lifting simple things as utensils, napkins and cups.  Stated that she had a diagnosis from the injury that was occurred.  Diagnosis she was given was chronic C5-C6 cervical retinopathy along with a mild right median neuropathy at the level of the wrist multilevel cervical spondylosis with degenerative disc disease straightening of the spine with areas of both anterior listhesis and retrolisthesis all leading to normal foraminal narrowing at C3-C7.  She will be referred to a neurologist to reevaluate and discuss treatment options.   She also has bouts of nausea and dizziness with movement- benign vertigo. She feels as though every time she goes to a provider's office there is another  incidental finding (cysts on her liver) so we need diagnosis and treatment having difficulty with managing body pain new diagnoses abnormal labs overwhelmed.  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  No  Myalgia  Crps (Complex Regional Pain Syndrome) Type I    Comprehensive ROS Pertinent positive and negative noted in HPI   Allergies  Allergen Reactions   Amlodipine Hives     Achy legs and s.o.b Achy legs and s.o.b    Hydrocodone-Acetaminophen  Hives and Rash   Iodinated Contrast Media Hives   Iodine Hives and Rash   Codeine    Hydrocortisone-Iodoquinol    Propoxyphene     Past Medical History:  Diagnosis Date   Anxiety    Cervical radiculopathy    GERD (gastroesophageal reflux disease)    Hiatal hernia 08/28/2022   [facility]  was found through a CT scan and fibroids and cyst on liver parenchyma   Hypertension    Myocardial infarction (HCC)    Panic attack     Current Outpatient Medications on File Prior to Visit  Medication Sig Dispense Refill   atorvastatin  (LIPITOR) 40 MG tablet Take 1 tablet (40 mg total) by mouth at bedtime. 90 tablet 1   cholecalciferol (VITAMIN D3) 25 MCG (1000 UNIT) tablet Take 1,000 Units by mouth daily.     clonazePAM  (KLONOPIN ) 0.5 MG tablet Take 1 tablet (0.5 mg total) by mouth 3 (three) times daily. 90 tablet 2   dicyclomine  (BENTYL ) 10 MG capsule Take 1 capsule (10 mg total) by mouth every 6 (six) hours as needed for spasms (cramps). 70 capsule 1   diphenhydrAMINE  (BENADRYL  ALLERGY) 25 MG tablet Take 2 tablets (50 mg total) by mouth as directed. 1 hour prior to your CT. 30 tablet 0   Eluxadoline  (VIBERZI ) 100 MG TABS Take 1 tablet (100 mg total) by mouth 2 (two)  times daily before a meal. 60 tablet 3   famotidine  (PEPCID ) 20 MG tablet Take 20 mg by mouth as needed.     ferrous sulfate  325 (65 FE) MG EC tablet TAKE 1 TABLET BY MOUTH EVERY DAY 90 tablet 0   hydrALAZINE  (APRESOLINE ) 25 MG tablet Take 1 tablet (25 mg total) by mouth 3 (three) times daily. 270 tablet 3   hydrochlorothiazide  (MICROZIDE ) 12.5 MG capsule Take 1 capsule (12.5 mg total) by mouth daily. 90 capsule 1   KLOR-CON  M10 10 MEQ tablet Take 1 tablet (10 mEq total) by mouth daily. 90 tablet 1   labetalol  (NORMODYNE ) 100 MG tablet Take 1 tablet (100 mg total) by mouth 2 (two) times daily. 180 tablet 1   lipase/protease/amylase (CREON )  36000 UNITS CPEP capsule Take 2 capsules (72,000 Units total) by mouth 3 (three) times daily with meals. May also take 1 capsule (36,000 Units total) as needed (with snacks - up to 2 snacks daily). 300 capsule 1   loratadine  (CLARITIN ) 10 MG tablet Take 1 tablet (10 mg total) by mouth daily. (Patient taking differently: Take 10 mg by mouth daily as needed.) 30 tablet 6   losartan  (COZAAR ) 50 MG tablet Take 1 tablet (50 mg total) by mouth daily. 90 tablet 1   methocarbamol  (ROBAXIN ) 500 MG tablet Take 1 tablet (500 mg total) by mouth 2 (two) times daily. (Patient taking differently: Take 500 mg by mouth daily as needed.) 20 tablet 0   mirtazapine  (REMERON ) 45 MG tablet Take 1 tablet (45 mg total) by mouth at bedtime. 30 tablet 2   naproxen  (NAPROSYN ) 500 MG tablet Take 1 tablet (500 mg total) by mouth 2 (two) times daily. (Patient taking differently: Take 500 mg by mouth as needed.) 90 tablet 1   Omega-3 Fatty Acids (FISH OIL) 1000 MG CAPS Take 1 capsule by mouth daily.     omeprazole  (PRILOSEC) 40 MG capsule Omeprazole  40 mg by mouth twice a day for 4 weeks ,then back to 40 mg once a day if that controls reflux symptoms 90 capsule 3   ondansetron  (ZOFRAN -ODT) 4 MG disintegrating tablet Take 1 tablet (4 mg total) by mouth every 8 (eight) hours as needed for nausea or vomiting. 20 tablet 0   ondansetron  (ZOFRAN -ODT) 4 MG disintegrating tablet Take 1 tablet (4 mg total) by mouth every 8 (eight) hours as needed for nausea or vomiting. 12 tablet 0   oxymetazoline  (AFRIN NASAL SPRAY) 0.05 % nasal spray Place 1 spray into both nostrils 2 (two) times daily. 15 mL 0   predniSONE  (DELTASONE ) 50 MG tablet Take 1 tablet by mouth 13 hours prior to CT. Take 1 tablet by mouth 7 hours prior to CT. Take 1 tablet by mouth 1 hour prior to CT. 3 tablet 0   sucralfate  (CARAFATE ) 1 g tablet Take 1 tablet (1 g total) by mouth 4 (four) times daily -  with meals and at bedtime. 120 tablet 0   No current facility-administered  medications on file prior to visit.   Health Maintenance  Topic Date Due   Hepatitis B Vaccine (1 of 3 - 19+ 3-dose series) Never done   Zoster (Shingles) Vaccine (1 of 2) Never done   COVID-19 Vaccine (1 - 2024-25 season) Never done   Flu Shot  08/22/2023   Pap with HPV screening  03/15/2024   Mammogram  10/09/2024   Colon Cancer Screening  12/23/2027   DTaP/Tdap/Td vaccine (2 - Td or Tdap) 04/12/2029   Hepatitis C  Screening  Completed   HIV Screening  Completed   HPV Vaccine  Aged Out   Meningitis B Vaccine  Aged Out    Objective:   Vitals:   07/29/23 1405  BP: 120/80  Pulse: 73  Resp: 16  SpO2: 98%  Weight: 163 lb (73.9 kg)   BP Readings from Last 3 Encounters:  07/29/23 120/80  07/03/23 134/80  03/31/23 114/77      Physical Exam Vitals reviewed.  Constitutional:      Appearance: Normal appearance.  HENT:     Head: Normocephalic.     Right Ear: Tympanic membrane, ear canal and external ear normal.     Left Ear: Tympanic membrane, ear canal and external ear normal.     Nose: Nose normal.     Mouth/Throat:     Mouth: Mucous membranes are moist.  Eyes:     Extraocular Movements: Extraocular movements intact.     Pupils: Pupils are equal, round, and reactive to light.  Cardiovascular:     Rate and Rhythm: Normal rate.  Pulmonary:     Effort: Pulmonary effort is normal.     Breath sounds: Normal breath sounds.  Abdominal:     General: Bowel sounds are normal.     Palpations: Abdomen is soft.  Musculoskeletal:        General: Swelling and tenderness present.     Cervical back: Normal range of motion.     Comments: Right hand Every where  Skin:    General: Skin is warm and dry.  Neurological:     Mental Status: She is alert and oriented to person, place, and time.  Psychiatric:        Mood and Affect: Mood normal.        Behavior: Behavior normal.        Thought Content: Thought content normal.    Assessment & Plan  Erin Pollard was seen today for pain,  nausea and dizziness.  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  This information will also be placed in her medical records.  Complex regional pain syndrome type 1 of right upper extremity 2/2 Myalgia She is followed by neurology and is to provide documentation of her falls and diagnosis and her follow-up appointment.  This information will also be placed in her medical records.  Generalized anxiety disorder with panic attacks Managed outside of practice by behavioral health  Nausea and vomiting, unspecified vomiting type 2/2 Benign paroxysmal positional vertigo due to bilateral vestibular disorder  Zofran  no longer seems to be effective she will discuss this with her gastrologist or leave a message for the nurse.  Patient have been counseled extensively about nutrition and exercise. Other issues discussed during this visit include: low cholesterol diet, weight control and daily exercise, foot care, annual eye examinations at Ophthalmology, importance of adherence with medications and regular follow-up. We also discussed long term complications of uncontrolled diabetes and hypertension.   Return in about 2 months (around 09/29/2023) for medical conditions.  The patient was given clear instructions to go to ER or return to medical center if symptoms don't improve, worsen or new problems develop. The patient verbalized understanding. The patient was told to call to get lab results if they haven't heard anything in the next week.   This note has been created with [employer]. Any transcriptional errors are unintentional.   Rosaline SHAUNNA Bohr, NP 07/29/2023, 3:07 PM

## 2023-07-29 NOTE — Telephone Encounter (Signed)
 Noted

## 2023-08-06 DIAGNOSIS — R0781 Pleurodynia: Secondary | ICD-10-CM | POA: Diagnosis not present

## 2023-08-06 DIAGNOSIS — W19XXXA Unspecified fall, initial encounter: Secondary | ICD-10-CM | POA: Diagnosis not present

## 2023-08-06 DIAGNOSIS — R1030 Lower abdominal pain, unspecified: Secondary | ICD-10-CM | POA: Diagnosis not present

## 2023-08-06 DIAGNOSIS — M549 Dorsalgia, unspecified: Secondary | ICD-10-CM | POA: Diagnosis not present

## 2023-08-06 DIAGNOSIS — R1084 Generalized abdominal pain: Secondary | ICD-10-CM | POA: Diagnosis not present

## 2023-08-07 ENCOUNTER — Telehealth: Payer: Self-pay | Admitting: Primary Care

## 2023-08-07 ENCOUNTER — Emergency Department (HOSPITAL_COMMUNITY)

## 2023-08-07 ENCOUNTER — Emergency Department (HOSPITAL_COMMUNITY)
Admission: EM | Admit: 2023-08-07 | Discharge: 2023-08-07 | Disposition: A | Discharge status: Home / Self Care | Attending: Emergency Medicine | Admitting: Emergency Medicine

## 2023-08-07 ENCOUNTER — Other Ambulatory Visit: Payer: Self-pay

## 2023-08-07 ENCOUNTER — Ambulatory Visit (INDEPENDENT_AMBULATORY_CARE_PROVIDER_SITE_OTHER): Admitting: Primary Care

## 2023-08-07 ENCOUNTER — Encounter (HOSPITAL_COMMUNITY): Payer: Self-pay

## 2023-08-07 DIAGNOSIS — M25511 Pain in right shoulder: Secondary | ICD-10-CM | POA: Insufficient documentation

## 2023-08-07 DIAGNOSIS — M25551 Pain in right hip: Secondary | ICD-10-CM | POA: Diagnosis not present

## 2023-08-07 DIAGNOSIS — Z79899 Other long term (current) drug therapy: Secondary | ICD-10-CM | POA: Insufficient documentation

## 2023-08-07 DIAGNOSIS — D320 Benign neoplasm of cerebral meninges: Secondary | ICD-10-CM | POA: Diagnosis not present

## 2023-08-07 DIAGNOSIS — M79671 Pain in right foot: Secondary | ICD-10-CM | POA: Insufficient documentation

## 2023-08-07 DIAGNOSIS — I700 Atherosclerosis of aorta: Secondary | ICD-10-CM | POA: Diagnosis not present

## 2023-08-07 DIAGNOSIS — S0990XA Unspecified injury of head, initial encounter: Secondary | ICD-10-CM | POA: Diagnosis not present

## 2023-08-07 DIAGNOSIS — K7689 Other specified diseases of liver: Secondary | ICD-10-CM | POA: Diagnosis not present

## 2023-08-07 DIAGNOSIS — S199XXA Unspecified injury of neck, initial encounter: Secondary | ICD-10-CM | POA: Diagnosis not present

## 2023-08-07 DIAGNOSIS — M79672 Pain in left foot: Secondary | ICD-10-CM | POA: Diagnosis not present

## 2023-08-07 DIAGNOSIS — M542 Cervicalgia: Secondary | ICD-10-CM | POA: Insufficient documentation

## 2023-08-07 DIAGNOSIS — I10 Essential (primary) hypertension: Secondary | ICD-10-CM | POA: Diagnosis not present

## 2023-08-07 DIAGNOSIS — R0789 Other chest pain: Secondary | ICD-10-CM | POA: Diagnosis not present

## 2023-08-07 DIAGNOSIS — M25562 Pain in left knee: Secondary | ICD-10-CM | POA: Insufficient documentation

## 2023-08-07 DIAGNOSIS — R519 Headache, unspecified: Secondary | ICD-10-CM | POA: Insufficient documentation

## 2023-08-07 DIAGNOSIS — M25561 Pain in right knee: Secondary | ICD-10-CM | POA: Diagnosis not present

## 2023-08-07 DIAGNOSIS — W19XXXA Unspecified fall, initial encounter: Secondary | ICD-10-CM | POA: Diagnosis not present

## 2023-08-07 DIAGNOSIS — R079 Chest pain, unspecified: Secondary | ICD-10-CM | POA: Insufficient documentation

## 2023-08-07 DIAGNOSIS — R220 Localized swelling, mass and lump, head: Secondary | ICD-10-CM | POA: Diagnosis not present

## 2023-08-07 DIAGNOSIS — R109 Unspecified abdominal pain: Secondary | ICD-10-CM | POA: Diagnosis not present

## 2023-08-07 DIAGNOSIS — M25512 Pain in left shoulder: Secondary | ICD-10-CM | POA: Diagnosis not present

## 2023-08-07 LAB — BASIC METABOLIC PANEL WITH GFR
Anion gap: 11 (ref 5–15)
BUN: 7 mg/dL (ref 6–20)
CO2: 25 mmol/L (ref 22–32)
Calcium: 9.7 mg/dL (ref 8.9–10.3)
Chloride: 102 mmol/L (ref 98–111)
Creatinine, Ser: 0.92 mg/dL (ref 0.44–1.00)
GFR, Estimated: >60 mL/min (ref >60)
Glucose, Bld: 89 mg/dL (ref 70–99)
Potassium: 3.1 mmol/L — ABNORMAL LOW (ref 3.5–5.1)
Sodium: 138 mmol/L (ref 135–145)

## 2023-08-07 LAB — I-STAT CHEM 8, ED
BUN: 6 mg/dL (ref 6–20)
Calcium, Ion: 1.13 mmol/L — ABNORMAL LOW (ref 1.15–1.40)
Chloride: 102 mmol/L (ref 98–111)
Creatinine, Ser: 0.9 mg/dL (ref 0.44–1.00)
Glucose, Bld: 87 mg/dL (ref 70–99)
HCT: 37 % (ref 36.0–46.0)
Hemoglobin: 12.6 g/dL (ref 12.0–15.0)
Potassium: 3.2 mmol/L — ABNORMAL LOW (ref 3.5–5.1)
Sodium: 141 mmol/L (ref 135–145)
TCO2: 28 mmol/L (ref 22–32)

## 2023-08-07 LAB — CBC
HCT: 36.4 % (ref 36.0–46.0)
Hemoglobin: 12 g/dL (ref 12.0–15.0)
MCH: 26.7 pg (ref 26.0–34.0)
MCHC: 33 g/dL (ref 30.0–36.0)
MCV: 80.9 fL (ref 80.0–100.0)
Platelets: 319 K/uL (ref 150–400)
RBC: 4.5 MIL/uL (ref 3.87–5.11)
RDW: 15 % (ref 11.5–15.5)
WBC: 7.7 K/uL (ref 4.0–10.5)
nRBC: 0 % (ref 0.0–0.2)

## 2023-08-07 LAB — TROPONIN I (HIGH SENSITIVITY): Troponin I (High Sensitivity): 8 ng/L (ref <18)

## 2023-08-07 MED ORDER — FENTANYL CITRATE PF 50 MCG/ML IJ SOSY
50.0000 ug | PREFILLED_SYRINGE | Freq: Once | INTRAMUSCULAR | Status: AC
  Administered 2023-08-07: 50 ug via INTRAVENOUS
  Filled 2023-08-07: qty 1

## 2023-08-07 MED ORDER — IBUPROFEN 600 MG PO TABS: 600.0000 mg | ORAL_TABLET | Freq: Four times a day (QID) | ORAL | 0 refills | Status: DC | PRN

## 2023-08-07 MED ORDER — ONDANSETRON HCL 4 MG/2ML IJ SOLN
4.0000 mg | Freq: Once | INTRAMUSCULAR | Status: AC
  Administered 2023-08-07: 4 mg via INTRAVENOUS
  Filled 2023-08-07: qty 2

## 2023-08-07 MED ORDER — POTASSIUM CHLORIDE CRYS ER 20 MEQ PO TBCR
40.0000 meq | EXTENDED_RELEASE_TABLET | Freq: Once | ORAL | Status: AC
  Administered 2023-08-07: 40 meq via ORAL
  Filled 2023-08-07: qty 2

## 2023-08-07 MED ORDER — IBUPROFEN 400 MG PO TABS
600.0000 mg | ORAL_TABLET | Freq: Once | ORAL | Status: AC
  Administered 2023-08-07: 600 mg via ORAL
  Filled 2023-08-07: qty 1

## 2023-08-07 NOTE — ED Notes (Signed)
Pt ambulated with minimal assistance.

## 2023-08-07 NOTE — Discharge Instructions (Addendum)
 It was a pleasure taking part in your care.  As discussed, your imaging, CT scans were negative for acute process or fracture.  Please follow-up with your PCP at your earliest convenience for reevaluation.  You may take 600 mg ibuprofen , 680 mg Tylenol  every 6 hours as needed for pain.  Return to the ED with any new or worsening symptoms.

## 2023-08-07 NOTE — ED Provider Notes (Signed)
 [place] EMERGENCY DEPARTMENT AT [facility] Provider Note   CSN: [id number] Arrival date & time: 08/07/23  0011     Patient presents with: Erin Pollard is a 53 y.o. female with medical history of anxiety, cervical radiculopathy, GERD, hypertension, panic attacks.  Patient presents to ED for evaluation of fall.  States that she was walking down the street this evening on the sidewalk when she was looking on her phone and all of a sudden she believes she fell through some kind of hole in the ground that she states had a green covering on it.  She reports that she fell forward onto her chest and began having chest pain immediately.  She describes that she is lightheaded currently but denies any lightheadedness, dizziness, chest pain or shortness of breath before the fall.  She reports that after falling, she called out in pain multiple times but no one came to her aid.  She states the please officer stopped and helped her on her feet.  She is here complaining of pain in her bilateral knees, right foot, bilateral shoulders, neck, headache, chest.  She also has pain in her right hip.  She denies blood thinners.    Fall       Prior to Admission medications   Medication Sig Start Date End Date Taking? Authorizing Provider  atorvastatin  (LIPITOR) 40 MG tablet Take 1 tablet (40 mg total) by mouth at bedtime. 10/08/22 07/03/23  Tolia, Sunit, DO  cholecalciferol (VITAMIN D3) 25 MCG (1000 UNIT) tablet Take 1,000 Units by mouth daily.    [provider]  clonazePAM  (KLONOPIN ) 0.5 MG tablet Take 1 tablet (0.5 mg total) by mouth 3 (three) times daily. 07/02/23 09/30/23  Bahraini, Sarah A  dicyclomine  (BENTYL ) 10 MG capsule Take 1 capsule (10 mg total) by mouth every 6 (six) hours as needed for spasms (cramps). 01/07/23   Cirigliano, Vito V, DO  diphenhydrAMINE  (BENADRYL  ALLERGY) 25 MG tablet Take 2 tablets (50 mg total) by mouth as directed. 1 hour prior to your CT. 10/21/22    Cirigliano, Sandor GAILS, DO  Eluxadoline  (VIBERZI ) 100 MG TABS Take 1 tablet (100 mg total) by mouth 2 (two) times daily before a meal. 10/08/22   Cirigliano, Vito V, DO  famotidine  (PEPCID ) 20 MG tablet Take 20 mg by mouth as needed.    [provider]  ferrous sulfate  325 (65 FE) MG EC tablet TAKE 1 TABLET BY MOUTH EVERY DAY 02/18/22   Celestia Rosaline SQUIBB, NP  hydrALAZINE  (APRESOLINE ) 25 MG tablet Take 1 tablet (25 mg total) by mouth 3 (three) times daily. 10/08/22   Tolia, Sunit, DO  hydrochlorothiazide  (MICROZIDE ) 12.5 MG capsule Take 1 capsule (12.5 mg total) by mouth daily. 04/01/23 09/28/23  Tolia, Sunit, DO  KLOR-CON  M10 10 MEQ tablet Take 1 tablet (10 mEq total) by mouth daily. 04/01/23   Tolia, Sunit, DO  labetalol  (NORMODYNE ) 100 MG tablet Take 1 tablet (100 mg total) by mouth 2 (two) times daily. 04/01/23 09/28/23  Tolia, Sunit, DO  lipase/protease/amylase (CREON ) 36000 UNITS CPEP capsule Take 2 capsules (72,000 Units total) by mouth 3 (three) times daily with meals. May also take 1 capsule (36,000 Units total) as needed (with snacks - up to 2 snacks daily). 11/27/22   Cirigliano, Vito V, DO  loratadine  (CLARITIN ) 10 MG tablet Take 1 tablet (10 mg total) by mouth daily. Patient taking differently: Take 10 mg by mouth daily as needed. 11/02/21   Celestia Rosaline SQUIBB,  NP  losartan  (COZAAR ) 50 MG tablet Take 1 tablet (50 mg total) by mouth daily. 04/01/23 09/28/23  Tolia, Sunit, DO  mirtazapine  (REMERON ) 45 MG tablet Take 1 tablet (45 mg total) by mouth at bedtime. 07/02/23 09/30/23  Bahraini, Sarah A  naproxen  (NAPROSYN ) 500 MG tablet Take 1 tablet (500 mg total) by mouth 2 (two) times daily. Patient taking differently: Take 500 mg by mouth as needed. 09/11/21   Tolia, Sunit, DO  Omega-3 Fatty Acids (FISH OIL) 1000 MG CAPS Take 1 capsule by mouth daily.    [provider]  omeprazole  (PRILOSEC) 40 MG capsule Omeprazole  40 mg by mouth twice a day for 4 weeks ,then back to 40 mg once a day if that  controls reflux symptoms 12/23/22   Cirigliano, Vito V, DO  ondansetron  (ZOFRAN -ODT) 4 MG disintegrating tablet Take 1 tablet (4 mg total) by mouth every 8 (eight) hours as needed for nausea or vomiting. 07/29/21   Enedelia Dorna HERO, FNP  oxymetazoline  (AFRIN NASAL SPRAY) 0.05 % nasal spray Place 1 spray into both nostrils 2 (two) times daily. 11/02/21   Celestia Rosaline SQUIBB, NP  predniSONE  (DELTASONE ) 50 MG tablet Take 1 tablet by mouth 13 hours prior to CT. Take 1 tablet by mouth 7 hours prior to CT. Take 1 tablet by mouth 1 hour prior to CT. 10/21/22   Cirigliano, Vito V, DO  sucralfate  (CARAFATE ) 1 g tablet Take 1 tablet (1 g total) by mouth 4 (four) times daily -  with meals and at bedtime. 04/20/21   Vicky Charleston, PA-C    Allergies: Amlodipine, Hydrocodone-acetaminophen , Iodinated contrast media, Iodine, Codeine, Hydrocortisone-iodoquinol, and Propoxyphene    Review of Systems  All other systems reviewed and are negative.   Updated Vital Signs BP (!) 151/72 (BP Location: Left Arm)   Pulse 82   Temp 98.9 F (37.2 C) (Oral)   Resp 18   Ht 5' (1.524 m)   SpO2 98%   BMI 31.83 kg/m   Physical Exam Vitals and nursing note reviewed.  Constitutional:      General: She is not in acute distress.    Appearance: She is well-developed.  HENT:     Head: Normocephalic and atraumatic.  Eyes:     Conjunctiva/sclera: Conjunctivae normal.  Cardiovascular:     Rate and Rhythm: Normal rate and regular rhythm.     Heart sounds: No murmur heard. Pulmonary:     Effort: Pulmonary effort is normal. No respiratory distress.     Breath sounds: Normal breath sounds.  Abdominal:     Palpations: Abdomen is soft.     Tenderness: There is no abdominal tenderness.  Musculoskeletal:        General: No swelling.     Cervical back: Neck supple.     Comments: Reduced ROM of bilateral shoulders.  Bilateral knees.  No shortening or rotation of bilateral lower extremities.  Skin:    General: Skin is  warm and dry.     Capillary Refill: Capillary refill takes less than 2 seconds.  Neurological:     Mental Status: She is alert and oriented to person, place, and time. Mental status is at baseline.     Comments: CN III through XII intact.  Intact finger-nose, heel-to-shin.  No pronator drift.  No slurred speech.  Equal strength throughout.  Sensation throughout.  PERRL.  Tracks cross midline.  Alert and oriented x 4.  Psychiatric:        Mood and Affect: Mood normal.     (  all labs ordered are listed, but only abnormal results are displayed) Labs Reviewed  BASIC METABOLIC PANEL WITH GFR - Abnormal; Notable for the following components:      Result Value   Potassium 3.1 (*)    All other components within normal limits  I-STAT CHEM 8, ED - Abnormal; Notable for the following components:   Potassium 3.2 (*)    Calcium , Ion 1.13 (*)    All other components within normal limits  CBC  TROPONIN I (HIGH SENSITIVITY)    EKG: None  Radiology: CT Cervical Spine Wo Contrast Result Date: 08/07/2023 CLINICAL DATA:  Head trauma, moderate-severe; Neck trauma, impaired ROM (Age 15-64y) EXAM: CT HEAD WITHOUT CONTRAST CT CERVICAL SPINE WITHOUT CONTRAST TECHNIQUE: Multidetector CT imaging of the head and cervical spine was performed following the standard protocol without intravenous contrast. Multiplanar CT image reconstructions of the cervical spine were also generated. RADIATION DOSE REDUCTION: This exam was performed according to the departmental dose-optimization program which includes automated exposure control, adjustment of the mA and/or kV according to patient size and/or use of iterative reconstruction technique. COMPARISON:  None Available. FINDINGS: CT HEAD FINDINGS Brain: No evidence of acute infarction, hemorrhage, hydrocephalus, extra-axial collection. Approximately 1.3 x 1.3 cm calcified extra-axial mass along the right frontal convexity adjacent bony hyperostosis, compatible with meningioma.  Vascular: No hyperdense vessel. Skull: No acute fracture. Sinuses/Orbits: Clear sinuses.  No acute orbital findings. Other: No mastoid effusions. CT CERVICAL SPINE FINDINGS Alignment: Approximately 2 mm of anterolisthesis of C4 on C5, favor degenerative given degenerative changes at this level and adjacent. Skull base and vertebrae: No acute fracture. Soft tissues and spinal canal: No prevertebral fluid or swelling. No visible canal hematoma. Disc levels: Mid and lower cervical degenerative change including degenerative disc disease and facet/uncovertebral hypertrophy which contributes to varying degrees of neural foraminal stenosis. Upper chest: Visualized lung apices are clear. IMPRESSION: 1. No evidence of acute abnormality intracranially or in the cervical spine. 2. Approximately 1.3 cm meningioma along the right frontal convexity with mild mass effect on the adjacent frontal lobe. No appreciable surrounding brain edema. Electronically Signed   By: Gilmore GORMAN Molt M.D.   On: 08/07/2023 02:01   CT Head Wo Contrast Result Date: 08/07/2023 CLINICAL DATA:  Head trauma, moderate-severe; Neck trauma, impaired ROM (Age 54-64y) EXAM: CT HEAD WITHOUT CONTRAST CT CERVICAL SPINE WITHOUT CONTRAST TECHNIQUE: Multidetector CT imaging of the head and cervical spine was performed following the standard protocol without intravenous contrast. Multiplanar CT image reconstructions of the cervical spine were also generated. RADIATION DOSE REDUCTION: This exam was performed according to the departmental dose-optimization program which includes automated exposure control, adjustment of the mA and/or kV according to patient size and/or use of iterative reconstruction technique. COMPARISON:  None Available. FINDINGS: CT HEAD FINDINGS Brain: No evidence of acute infarction, hemorrhage, hydrocephalus, extra-axial collection. Approximately 1.3 x 1.3 cm calcified extra-axial mass along the right frontal convexity adjacent bony  hyperostosis, compatible with meningioma. Vascular: No hyperdense vessel. Skull: No acute fracture. Sinuses/Orbits: Clear sinuses.  No acute orbital findings. Other: No mastoid effusions. CT CERVICAL SPINE FINDINGS Alignment: Approximately 2 mm of anterolisthesis of C4 on C5, favor degenerative given degenerative changes at this level and adjacent. Skull base and vertebrae: No acute fracture. Soft tissues and spinal canal: No prevertebral fluid or swelling. No visible canal hematoma. Disc levels: Mid and lower cervical degenerative change including degenerative disc disease and facet/uncovertebral hypertrophy which contributes to varying degrees of neural foraminal stenosis. Upper chest: Visualized lung apices  are clear. IMPRESSION: 1. No evidence of acute abnormality intracranially or in the cervical spine. 2. Approximately 1.3 cm meningioma along the right frontal convexity with mild mass effect on the adjacent frontal lobe. No appreciable surrounding brain edema. Electronically Signed   By: Gilmore GORMAN Molt M.D.   On: 08/07/2023 02:01   CT CHEST ABDOMEN PELVIS WO CONTRAST Result Date: 08/07/2023 CLINICAL DATA:  [place]lem[place] and manhole with chest and abdominal pain, initial encounter EXAM: CT CHEST, ABDOMEN AND PELVIS WITHOUT CONTRAST TECHNIQUE: Multidetector CT imaging of the chest, abdomen and pelvis was performed following the standard protocol without IV contrast. RADIATION DOSE REDUCTION: This exam was performed according to the departmental dose-optimization program which includes automated exposure control, adjustment of the mA and/or kV according to patient size and/or use of iterative reconstruction technique. COMPARISON:  10/30/2022 FINDINGS: CT CHEST FINDINGS Cardiovascular: Somewhat limited due to lack of IV contrast. Atherosclerotic calcifications of the thoracic aorta are noted. No cardiac enlargement is seen. Coronary calcifications are noted. Pulmonary artery is not significantly enlarged.  Mediastinum/Nodes: Thoracic inlet is within normal limits. No hilar or mediastinal adenopathy is seen. The esophagus as visualized is within normal limits. Lungs/Pleura: Lungs are well aerated bilaterally. No focal infiltrate or sizable effusion is seen. No sizable parenchymal nodule is noted. Musculoskeletal: No acute bony abnormality is seen. CT ABDOMEN PELVIS FINDINGS Hepatobiliary: Dominant 5 cm cyst is noted in the left lobe of the liver stable in appearance from the prior exam. No new focal abnormality is seen. The gallbladder is decompressed. Pancreas: Unremarkable. No pancreatic ductal dilatation or surrounding inflammatory changes. Spleen: Normal in size without focal abnormality. Adrenals/Urinary Tract: Adrenal glands are within normal limits. Kidneys demonstrate no renal calculi or obstructive changes. The bladder is within normal limits. Stomach/Bowel: No obstructive or inflammatory changes of the colon are seen. Scattered fecal material is noted. The appendix is within normal limits. Small bowel and stomach are unremarkable. Vascular/Lymphatic: Aortic atherosclerosis. No enlarged abdominal or pelvic lymph nodes. Reproductive: Uterus and bilateral adnexa are unremarkable. Other: No abdominal wall hernia or abnormality. No abdominopelvic ascites. Musculoskeletal: No acute or significant osseous findings. IMPRESSION: CT of the chest: No acute abnormality noted. CT of the abdomen and pelvis: Chronic changes as described. No acute abnormality noted. Electronically Signed   By: Oneil Devonshire M.D.   On: 08/07/2023 01:55   DG Foot Complete Right Result Date: 08/07/2023 EXAM: 3 VIEW(S) XRAY OF THE RIGHT FOOT 08/07/2023 01:29:58 AM COMPARISON: None available. CLINICAL HISTORY: SP fall with knee pain, foot pain. Encounter for fall with knee and foot pain, pt fell in a couple feet manhole tonight. FINDINGS: BONES AND JOINTS: No acute fracture. No focal osseous lesion. No joint dislocation. SOFT TISSUES: The soft  tissues are unremarkable. IMPRESSION: 1. No significant abnormality. Electronically signed by: Norman Gatlin MD 08/07/2023 01:43 AM EDT RP Workstation: [id number]   DG Knee 2 Views Left Result Date: 08/07/2023 EXAM: 1 or 2 VIEW(S) XRAY OF THE LEFT KNEE 08/07/2023 01:29:11 AM COMPARISON: None available. CLINICAL HISTORY: SP fall with knee pain, foot pain. Encounter for fall with knee and foot pain, pt fell in a couple feet manhole tonight. FINDINGS: BONES AND JOINTS: No acute fracture. No focal osseous lesion. No joint dislocation. No significant joint effusion. No significant degenerative changes. SOFT TISSUES: The soft tissues are unremarkable. IMPRESSION: 1. No significant abnormality. Electronically signed by: Norman Gatlin MD 08/07/2023 01:41 AM EDT RP Workstation: [id number]   DG Knee 2 Views Right Result Date: 08/07/2023 EXAM: 1 or 2  VIEW(S) XRAY OF THE RIGHT KNEE 08/07/2023 01:23:52 AM COMPARISON: None available. CLINICAL HISTORY: SP fall with knee pain, foot pain. Encounter for fall with knee and foot pain, pt fell in a couple feet manhole tonight. FINDINGS: BONES AND JOINTS: No acute fracture. No focal osseous lesion. No joint dislocation. No significant joint effusion. No significant degenerative changes. SOFT TISSUES: The soft tissues are unremarkable. IMPRESSION: 1. No significant abnormality. Electronically signed by: Norman Gatlin MD 08/07/2023 01:41 AM EDT RP Workstation: [id number]    Procedures   Medications Ordered in the ED  fentaNYL  (SUBLIMAZE ) injection 50 mcg (50 mcg Intravenous Given 08/07/23 0103)  ondansetron  (ZOFRAN ) injection 4 mg (4 mg Intravenous Given 08/07/23 0102)  fentaNYL  (SUBLIMAZE ) injection 50 mcg (50 mcg Intravenous Given 08/07/23 0237)  potassium chloride  SA (KLOR-CON  M) CR tablet 40 mEq (40 mEq Oral Given 08/07/23 0242)  ondansetron  (ZOFRAN ) injection 4 mg (4 mg Intravenous Given 08/07/23 0411)    Clinical Course as of 08/07/23 0433  Thu Aug 07, 2023  0236  ED EKG [SS]    Clinical Course User Index [SS] Stotelmyre, Sera, Student-PA   Medical Decision Making Amount and/or Complexity of Data Reviewed Labs: ordered. Radiology: ordered.  Risk Prescription drug management.   53 year old female presents for evaluation.  Please see HPI for further details.  53 year old female presents after falling through [facility].  On examination, she is complaining of pain in her hips, knees, shoulders, pain throughout.  She will not allow me to fully examine her.  She reports he is having significant pain in her chest as well.  Will order CBC, BMP, troponin, i-STAT Chem-8, x-rays of bilateral knees,, right foot, CT head, cervical spine, CT chest abdomen pelvis without contrast.  I have also ordered EKG.  Patient given 50 mcg fentanyl , 4 mg Zofran .  Patient CBC without leukocytosis or anemia.  Metabolic panel reveals potassium 3.1 repleted with 40 mEq oral potassium.  No other electrolyte derangement.  Troponin 8.  EKG nonischemic.  Patient imaging is negative for all.  No evidence of acute fractures or processes on patient imaging.  CT head, cervical spine unremarkable.  Patient required additional pain control.  After pain control was administered, patient was able to ambulate without difficulty.  Patient will be discharged home at this time.  She was advised that she will have aches and pains consistent with fall.  She was advised to follow-up with her PCP or return to the ED with any new or worsening symptoms and she voiced understanding.  She had all her questions answered to her satisfaction.  She is stable to discharge home at this time.    Final diagnoses:  Fall, initial encounter    ED Discharge Orders     None          Ruthell Lonni JULIANNA DEVONNA 08/07/23 0433    Raford Lenis, MD 08/07/23 [phone]

## 2023-08-07 NOTE — Telephone Encounter (Signed)
 Copied from CRM [id number]. Topic: Appointments - Appointment Scheduling >> Aug 07, 2023  1:52 PM Myrick T wrote:  Patient/patient representative is calling to schedule an appointment. Refer to attachments for appointment information. Patient was seen in the ER due a fall in a manhole. She has bruises, abrasions on her hands and muscular pain. No appts until 7/30 but she wants to be seen sooner. Please f/u with patient for appt

## 2023-08-07 NOTE — Telephone Encounter (Signed)
 Can you please call this patient? Thank you

## 2023-08-07 NOTE — ED Triage Notes (Signed)
 Pt BIB [facility]. Pt was walking down the street and fell through a manhole. Unknown depth, [employer] reports a couple feet. Pt was pulled out by [employer]. EMS reports possible tib/fib fracture to right leg and possible fracture to right hand. Obvious deformities, swelling, redness and tenderness noted. Pt endorses right leg, right side, abdomen and neck pain & tenderness. No blood thinners,no LOC. GCS 15.Pt also has sheets as a pelvic binder.  Hx HTN  EMS 98P 180SBP

## 2023-08-08 NOTE — Telephone Encounter (Signed)
 Noted! Thank you

## 2023-08-13 ENCOUNTER — Ambulatory Visit (INDEPENDENT_AMBULATORY_CARE_PROVIDER_SITE_OTHER): Admitting: Primary Care

## 2023-08-13 ENCOUNTER — Emergency Department (HOSPITAL_COMMUNITY)

## 2023-08-13 ENCOUNTER — Emergency Department (HOSPITAL_COMMUNITY)
Admission: EM | Admit: 2023-08-13 | Discharge: 2023-08-14 | Disposition: A | Discharge status: Home / Self Care | Source: Ambulatory Visit | Attending: Emergency Medicine | Admitting: Emergency Medicine

## 2023-08-13 ENCOUNTER — Other Ambulatory Visit: Payer: Self-pay

## 2023-08-13 ENCOUNTER — Encounter (INDEPENDENT_AMBULATORY_CARE_PROVIDER_SITE_OTHER): Payer: Self-pay | Admitting: Primary Care

## 2023-08-13 ENCOUNTER — Encounter (HOSPITAL_COMMUNITY): Payer: Self-pay

## 2023-08-13 VITALS — BP 125/86 | HR 71 | Resp 16 | Wt 162.4 lb

## 2023-08-13 DIAGNOSIS — R0789 Other chest pain: Secondary | ICD-10-CM | POA: Diagnosis not present

## 2023-08-13 DIAGNOSIS — G90511 Complex regional pain syndrome I of right upper limb: Secondary | ICD-10-CM | POA: Diagnosis not present

## 2023-08-13 DIAGNOSIS — M791 Myalgia, unspecified site: Secondary | ICD-10-CM | POA: Insufficient documentation

## 2023-08-13 DIAGNOSIS — R52 Pain, unspecified: Secondary | ICD-10-CM

## 2023-08-13 DIAGNOSIS — W1839XA Other fall on same level, initial encounter: Secondary | ICD-10-CM | POA: Diagnosis not present

## 2023-08-13 DIAGNOSIS — R079 Chest pain, unspecified: Secondary | ICD-10-CM | POA: Diagnosis not present

## 2023-08-13 LAB — CBC WITH DIFFERENTIAL/PLATELET
Abs Immature Granulocytes: 0.01 K/uL (ref 0.00–0.07)
Basophils Absolute: 0 K/uL (ref 0.0–0.1)
Basophils Relative: 0 %
Eosinophils Absolute: 0.1 K/uL (ref 0.0–0.5)
Eosinophils Relative: 2 %
HCT: 37.9 % (ref 36.0–46.0)
Hemoglobin: 12.3 g/dL (ref 12.0–15.0)
Immature Granulocytes: 0 %
Lymphocytes Relative: 30 %
Lymphs Abs: 1.6 K/uL (ref 0.7–4.0)
MCH: 26.2 pg (ref 26.0–34.0)
MCHC: 32.5 g/dL (ref 30.0–36.0)
MCV: 80.8 fL (ref 80.0–100.0)
Monocytes Absolute: 0.5 K/uL (ref 0.1–1.0)
Monocytes Relative: 9 %
Neutro Abs: 3.1 K/uL (ref 1.7–7.7)
Neutrophils Relative %: 59 %
Platelets: 320 K/uL (ref 150–400)
RBC: 4.69 MIL/uL (ref 3.87–5.11)
RDW: 14.9 % (ref 11.5–15.5)
WBC: 5.3 K/uL (ref 4.0–10.5)
nRBC: 0 % (ref 0.0–0.2)

## 2023-08-13 LAB — URINALYSIS, ROUTINE W REFLEX MICROSCOPIC
Bacteria, UA: NONE SEEN
Bilirubin Urine: NEGATIVE
Glucose, UA: NEGATIVE mg/dL
Hgb urine dipstick: NEGATIVE
Ketones, ur: 5 mg/dL — AB
Leukocytes,Ua: NEGATIVE
Nitrite: NEGATIVE
Protein, ur: 30 mg/dL — AB
Specific Gravity, Urine: 1.028 (ref 1.005–1.030)
pH: 5 (ref 5.0–8.0)

## 2023-08-13 LAB — COMPREHENSIVE METABOLIC PANEL WITH GFR
ALT: 34 U/L (ref 0–44)
AST: 30 U/L (ref 15–41)
Albumin: 4.2 g/dL (ref 3.5–5.0)
Alkaline Phosphatase: 87 U/L (ref 38–126)
Anion gap: 13 (ref 5–15)
BUN: 12 mg/dL (ref 6–20)
CO2: 25 mmol/L (ref 22–32)
Calcium: 9.7 mg/dL (ref 8.9–10.3)
Chloride: 102 mmol/L (ref 98–111)
Creatinine, Ser: 0.63 mg/dL (ref 0.44–1.00)
GFR, Estimated: >60 mL/min (ref >60)
Glucose, Bld: 118 mg/dL — ABNORMAL HIGH (ref 70–99)
Potassium: 3.3 mmol/L — ABNORMAL LOW (ref 3.5–5.1)
Sodium: 140 mmol/L (ref 135–145)
Total Bilirubin: 0.5 mg/dL (ref 0.0–1.2)
Total Protein: 8.2 g/dL — ABNORMAL HIGH (ref 6.5–8.1)

## 2023-08-13 LAB — TROPONIN I (HIGH SENSITIVITY): Troponin I (High Sensitivity): 3 ng/L (ref <18)

## 2023-08-13 MED ORDER — ONDANSETRON 4 MG PO TBDP
4.0000 mg | ORAL_TABLET | Freq: Once | ORAL | Status: AC
  Administered 2023-08-14: 4 mg via ORAL
  Filled 2023-08-13: qty 1

## 2023-08-13 NOTE — ED Triage Notes (Signed)
 Pt came in for reevaluation after a fall on the 17th. Pt followed up with her PCP today and was told to come to the ED for re-evaluation. Pt c/o generalized body pain, a light headache, and nausea.

## 2023-08-13 NOTE — Progress Notes (Signed)
 [facility]  Erin Pollard, is a 53 y.o. female  RDW:[id number]  FMW:[id number]  DOB - 03-08-1970  Everything hurts       Subjective:   Erin Pollard is a 53 y.o. female here today for an acute visit.  Reviewed ED notes regarding fall scans and x-rays.  Patient's is in severe pain all over especially shoulders hands and feet.  Explained to her unfortunately I have no answer all the way of treatment other than to send you back to the emergency room as stated on your discharge papers.    HPI  No problems updated.  Comprehensive ROS Pertinent positive and negative noted in HPI   Allergies  Allergen Reactions   Amlodipine Hives    Achy legs and s.o.b Achy legs and s.o.b    Hydrocodone-Acetaminophen  Hives and Rash   Iodinated Contrast Media Hives   Iodine Hives and Rash   Codeine    Hydrocortisone-Iodoquinol    Propoxyphene     Past Medical History:  Diagnosis Date   Anxiety    Cervical radiculopathy    GERD (gastroesophageal reflux disease)    Hiatal hernia 08/28/2022   [facility]  was found through a CT scan and fibroids and cyst on liver parenchyma   Hypertension    Myocardial infarction (HCC)    Panic attack     Current Outpatient Medications on File Prior to Visit  Medication Sig Dispense Refill   atorvastatin  (LIPITOR) 40 MG tablet Take 1 tablet (40 mg total) by mouth at bedtime. 90 tablet 1   cholecalciferol (VITAMIN D3) 25 MCG (1000 UNIT) tablet Take 1,000 Units by mouth daily.     clonazePAM  (KLONOPIN ) 0.5 MG tablet Take 1 tablet (0.5 mg total) by mouth 3 (three) times daily. 90 tablet 2   dicyclomine  (BENTYL ) 10 MG capsule Take 1 capsule (10 mg total) by mouth every 6 (six) hours as needed for spasms (cramps). 70 capsule 1   diphenhydrAMINE  (BENADRYL  ALLERGY) 25 MG tablet Take 2 tablets (50 mg total) by mouth as directed. 1 hour prior to your CT. 30 tablet 0   Eluxadoline  (VIBERZI ) 100 MG TABS Take 1 tablet (100 mg total)  by mouth 2 (two) times daily before a meal. 60 tablet 3   famotidine  (PEPCID ) 20 MG tablet Take 20 mg by mouth as needed.     ferrous sulfate  325 (65 FE) MG EC tablet TAKE 1 TABLET BY MOUTH EVERY DAY 90 tablet 0   hydrALAZINE  (APRESOLINE ) 25 MG tablet Take 1 tablet (25 mg total) by mouth 3 (three) times daily. 270 tablet 3   hydrochlorothiazide  (MICROZIDE ) 12.5 MG capsule Take 1 capsule (12.5 mg total) by mouth daily. 90 capsule 1   KLOR-CON  M10 10 MEQ tablet Take 1 tablet (10 mEq total) by mouth daily. 90 tablet 1   labetalol  (NORMODYNE ) 100 MG tablet Take 1 tablet (100 mg total) by mouth 2 (two) times daily. 180 tablet 1   lipase/protease/amylase (CREON ) 36000 UNITS CPEP capsule Take 2 capsules (72,000 Units total) by mouth 3 (three) times daily with meals. May also take 1 capsule (36,000 Units total) as needed (with snacks - up to 2 snacks daily). 300 capsule 1   loratadine  (CLARITIN ) 10 MG tablet Take 1 tablet (10 mg total) by mouth daily. (Patient taking differently: Take 10 mg by mouth daily as needed.) 30 tablet 6   losartan  (COZAAR ) 50 MG tablet Take 1 tablet (50 mg total) by mouth daily. 90 tablet 1   mirtazapine  (REMERON )  45 MG tablet Take 1 tablet (45 mg total) by mouth at bedtime. 30 tablet 2   naproxen  (NAPROSYN ) 500 MG tablet Take 1 tablet (500 mg total) by mouth 2 (two) times daily. (Patient taking differently: Take 500 mg by mouth as needed.) 90 tablet 1   Omega-3 Fatty Acids (FISH OIL) 1000 MG CAPS Take 1 capsule by mouth daily.     omeprazole  (PRILOSEC) 40 MG capsule Omeprazole  40 mg by mouth twice a day for 4 weeks ,then back to 40 mg once a day if that controls reflux symptoms 90 capsule 3   ondansetron  (ZOFRAN -ODT) 4 MG disintegrating tablet Take 1 tablet (4 mg total) by mouth every 8 (eight) hours as needed for nausea or vomiting. 20 tablet 0   oxymetazoline  (AFRIN NASAL SPRAY) 0.05 % nasal spray Place 1 spray into both nostrils 2 (two) times daily. 15 mL 0   predniSONE   (DELTASONE ) 50 MG tablet Take 1 tablet by mouth 13 hours prior to CT. Take 1 tablet by mouth 7 hours prior to CT. Take 1 tablet by mouth 1 hour prior to CT. 3 tablet 0   sucralfate  (CARAFATE ) 1 g tablet Take 1 tablet (1 g total) by mouth 4 (four) times daily -  with meals and at bedtime. 120 tablet 0   No current facility-administered medications on file prior to visit.   Health Maintenance  Topic Date Due   Hepatitis B Vaccine (1 of 3 - 19+ 3-dose series) Never done   Zoster (Shingles) Vaccine (1 of 2) Never done   COVID-19 Vaccine (1 - 2024-25 season) Never done   Flu Shot  08/22/2023   Pap with HPV screening  03/15/2024   Mammogram  10/09/2024   Colon Cancer Screening  12/23/2027   DTaP/Tdap/Td vaccine (2 - Td or Tdap) 04/12/2029   Hepatitis C Screening  Completed   HIV Screening  Completed   HPV Vaccine  Aged Out   Meningitis B Vaccine  Aged Out    Objective:  BP 125/86   Pulse 71   Resp 16   Wt 162 lb 6.4 oz (73.7 kg)   SpO2 100%   BMI 31.72 kg/m   Vitals:   08/13/23 1628  BP: 125/86  Pulse: 71  Resp: 16  SpO2: 100%  Weight: 162 lb 6.4 oz (73.7 kg)     Physical Exam Vitals reviewed.  Constitutional:      Appearance: She is obese.  HENT:     Nose: Nose normal.  Cardiovascular:     Rate and Rhythm: Normal rate and regular rhythm.  Pulmonary:     Effort: Pulmonary effort is normal.     Breath sounds: Normal breath sounds.  Abdominal:     General: Bowel sounds are normal. There is distension.     Palpations: Abdomen is soft.  Musculoskeletal:        General: Normal range of motion.     Cervical back: Normal range of motion.  Neurological:     Mental Status: She is alert and oriented to person, place, and time.  Psychiatric:        Mood and Affect: Mood normal.        Behavior: Behavior normal.       Assessment & Plan   Erin Pollard was seen today for hospitalization follow-up.  Diagnoses and all orders for this visit:  Complex regional pain syndrome  type 1 of right upper extremity Per ED note return if no improved advised pt  Patient have been counseled extensively about nutrition and exercise. Other issues discussed during this visit include: low cholesterol diet, weight control and daily exercise, foot care, annual eye examinations at Ophthalmology, importance of adherence with medications and regular follow-up. We also discussed long term complications of uncontrolled diabetes and hypertension.   No follow-ups on file.  The patient was given clear instructions to go to ER or return to medical center if symptoms don't improve, worsen or new problems develop. The patient verbalized understanding. The patient was told to call to get lab results if they haven't heard anything in the next week.   This note has been created with Education officer, environmental. Any transcriptional errors are unintentional.   Erin SHAUNNA Bohr, NP 08/18/2023, 1:41 PM

## 2023-08-13 NOTE — Addendum Note (Signed)
 Addended by: CELESTIA BROWNING on: 08/13/2023 03:49 PM   Modules accepted: Orders

## 2023-08-13 NOTE — ED Provider Triage Note (Signed)
 Emergency Medicine Provider Triage Evaluation Note  Erin Pollard , a 53 y.o. female  was evaluated in triage.  Pt complains of pain everywhere. Patient had a recent fall and was checked out on 08/07/23 with a negative work-up at this time. Imaging did not show any acute fractures or findings. Patient returns today with complaint of pain everywhere including hands, arms, knees, etc. Patient states she also feels off. Patient states that she does have a history of anxiety.   Review of Systems  Positive: Nausea, generalized pain Negative: Fever, chills, vomiting  Physical Exam  BP (!) 152/104 (BP Location: Left Arm)   Pulse (!) 101   Temp 98.2 F (36.8 C) (Oral)   Resp 18   Wt 73.9 kg   SpO2 100%   BMI 31.83 kg/m  Gen:   Awake, no distress   Resp:  Normal effort  MSK:   Moves extremities without difficulty  Other:  No obvious abnormalities on auscultation of heart or lungs. Patient anxious in exam room. Patient speaking in full sentences on room air.   Medical Decision Making  Medically screening exam initiated at 7:40 PM.  Appropriate orders placed.  Erin Pollard was informed that the remainder of the evaluation will be completed by another provider, this initial triage assessment does not replace that evaluation, and the importance of remaining in the ED until their evaluation is complete.  Orders: CBC, CMP, CXR, troponin, EKG, zofran    Erin Pollard, [place] 08/13/23 1955

## 2023-08-14 ENCOUNTER — Encounter: Payer: Self-pay | Admitting: Neurology

## 2023-08-14 MED ORDER — TRAMADOL HCL 50 MG PO TABS
50.0000 mg | ORAL_TABLET | Freq: Once | ORAL | Status: AC
  Administered 2023-08-14: 50 mg via ORAL
  Filled 2023-08-14: qty 1

## 2023-08-14 MED ORDER — TRAMADOL HCL 50 MG PO TABS: 50.0000 mg | ORAL_TABLET | Freq: Four times a day (QID) | ORAL | 0 refills | Status: DC | PRN

## 2023-08-14 NOTE — Discharge Instructions (Signed)
 Follow up with your doctor and follow through with the referrals being provided.   Take Tramadol  for pain. You can continue Tylenol  as well

## 2023-08-14 NOTE — ED Provider Notes (Signed)
 [place] EMERGENCY DEPARTMENT AT [facility] Provider Note   CSN: [id number] Arrival date & time: 08/13/23  8082     Patient presents with: Fall, Generalized Pain, and Nausea   Erin Pollard is a 53 y.o. female.   Patient to ED with c/o generalized pain described as burning. She states symptoms are chronic and she has been referred to neurology for further investigation, but she fell on 7/17 which exacerbated her symptoms. She takes Tylenol  for pain. Seen in the ED on the 17th and had a negative evaluation from the actual fall. No fever, vomiting. She comes today for persistent pain.  The history is provided by the patient. No language interpreter was used.  Fall       Prior to Admission medications   Medication Sig Start Date End Date Taking? Authorizing Provider  traMADol  (ULTRAM ) 50 MG tablet Take 1 tablet (50 mg total) by mouth every 6 (six) hours as needed. 08/14/23  Yes Lamin Chandley, Margit, PA-C  atorvastatin  (LIPITOR) 40 MG tablet Take 1 tablet (40 mg total) by mouth at bedtime. 10/08/22 07/03/23  Tolia, Sunit, DO  cholecalciferol (VITAMIN D3) 25 MCG (1000 UNIT) tablet Take 1,000 Units by mouth daily.    [provider]  clonazePAM  (KLONOPIN ) 0.5 MG tablet Take 1 tablet (0.5 mg total) by mouth 3 (three) times daily. 07/02/23 09/30/23  Bahraini, Sarah A  dicyclomine  (BENTYL ) 10 MG capsule Take 1 capsule (10 mg total) by mouth every 6 (six) hours as needed for spasms (cramps). 01/07/23   Cirigliano, Vito V, DO  diphenhydrAMINE  (BENADRYL  ALLERGY) 25 MG tablet Take 2 tablets (50 mg total) by mouth as directed. 1 hour prior to your CT. 10/21/22   Cirigliano, Vito V, DO  Eluxadoline  (VIBERZI ) 100 MG TABS Take 1 tablet (100 mg total) by mouth 2 (two) times daily before a meal. 10/08/22   Cirigliano, Vito V, DO  famotidine  (PEPCID ) 20 MG tablet Take 20 mg by mouth as needed.    [provider]  ferrous sulfate  325 (65 FE) MG EC tablet TAKE 1 TABLET BY MOUTH EVERY  DAY 02/18/22   Celestia Rosaline SQUIBB, NP  hydrALAZINE  (APRESOLINE ) 25 MG tablet Take 1 tablet (25 mg total) by mouth 3 (three) times daily. 10/08/22   Tolia, Sunit, DO  hydrochlorothiazide  (MICROZIDE ) 12.5 MG capsule Take 1 capsule (12.5 mg total) by mouth daily. 04/01/23 09/28/23  Tolia, Sunit, DO  KLOR-CON  M10 10 MEQ tablet Take 1 tablet (10 mEq total) by mouth daily. 04/01/23   Tolia, Sunit, DO  labetalol  (NORMODYNE ) 100 MG tablet Take 1 tablet (100 mg total) by mouth 2 (two) times daily. 04/01/23 09/28/23  Tolia, Sunit, DO  lipase/protease/amylase (CREON ) 36000 UNITS CPEP capsule Take 2 capsules (72,000 Units total) by mouth 3 (three) times daily with meals. May also take 1 capsule (36,000 Units total) as needed (with snacks - up to 2 snacks daily). 11/27/22   Cirigliano, Vito V, DO  loratadine  (CLARITIN ) 10 MG tablet Take 1 tablet (10 mg total) by mouth daily. Patient taking differently: Take 10 mg by mouth daily as needed. 11/02/21   Celestia Rosaline SQUIBB, NP  losartan  (COZAAR ) 50 MG tablet Take 1 tablet (50 mg total) by mouth daily. 04/01/23 09/28/23  Tolia, Sunit, DO  mirtazapine  (REMERON ) 45 MG tablet Take 1 tablet (45 mg total) by mouth at bedtime. 07/02/23 09/30/23  Bahraini, Sarah A  naproxen  (NAPROSYN ) 500 MG tablet Take 1 tablet (500 mg total) by mouth 2 (two) times daily. Patient  taking differently: Take 500 mg by mouth as needed. 09/11/21   Tolia, Sunit, DO  Omega-3 Fatty Acids (FISH OIL) 1000 MG CAPS Take 1 capsule by mouth daily.    [provider]  omeprazole  (PRILOSEC) 40 MG capsule Omeprazole  40 mg by mouth twice a day for 4 weeks ,then back to 40 mg once a day if that controls reflux symptoms 12/23/22   Cirigliano, Vito V, DO  ondansetron  (ZOFRAN -ODT) 4 MG disintegrating tablet Take 1 tablet (4 mg total) by mouth every 8 (eight) hours as needed for nausea or vomiting. 07/29/21   Enedelia Dorna HERO, FNP  oxymetazoline  (AFRIN NASAL SPRAY) 0.05 % nasal spray Place 1 spray into both nostrils 2  (two) times daily. 11/02/21   Celestia Rosaline SQUIBB, NP  predniSONE  (DELTASONE ) 50 MG tablet Take 1 tablet by mouth 13 hours prior to CT. Take 1 tablet by mouth 7 hours prior to CT. Take 1 tablet by mouth 1 hour prior to CT. 10/21/22   Cirigliano, Vito V, DO  sucralfate  (CARAFATE ) 1 g tablet Take 1 tablet (1 g total) by mouth 4 (four) times daily -  with meals and at bedtime. 04/20/21   Vicky Charleston, PA-C    Allergies: Amlodipine, Hydrocodone-acetaminophen , Iodinated contrast media, Iodine, Codeine, Hydrocortisone-iodoquinol, and Propoxyphene    Review of Systems  Updated Vital Signs BP 119/71   Pulse 69   Temp 98.5 F (36.9 C)   Resp 17   Wt 73.9 kg   SpO2 94%   BMI 31.83 kg/m   Physical Exam Vitals and nursing note reviewed.  Constitutional:      Appearance: She is well-developed.  HENT:     Head: Normocephalic.  Cardiovascular:     Rate and Rhythm: Normal rate and regular rhythm.     Heart sounds: No murmur heard. Pulmonary:     Effort: Pulmonary effort is normal.     Breath sounds: Normal breath sounds. No wheezing, rhonchi or rales.  Abdominal:     General: Bowel sounds are normal.     Palpations: Abdomen is soft.     Tenderness: There is no abdominal tenderness. There is no guarding or rebound.  Musculoskeletal:        General: Normal range of motion.     Cervical back: Normal range of motion and neck supple.     Comments: No swelling or redness to joints or musculature of the body  Skin:    General: Skin is warm and dry.  Neurological:     General: No focal deficit present.     Mental Status: She is alert and oriented to person, place, and time.     (all labs ordered are listed, but only abnormal results are displayed) Labs Reviewed  COMPREHENSIVE METABOLIC PANEL WITH GFR - Abnormal; Notable for the following components:      Result Value   Potassium 3.3 (*)    Glucose, Bld 118 (*)    Total Protein 8.2 (*)    All other components within normal limits   URINALYSIS, ROUTINE W REFLEX MICROSCOPIC - Abnormal; Notable for the following components:   APPearance HAZY (*)    Ketones, ur 5 (*)    Protein, ur 30 (*)    All other components within normal limits  CBC WITH DIFFERENTIAL/PLATELET  TROPONIN I (HIGH SENSITIVITY)    EKG: None  Radiology: DG Chest 2 View Result Date: 08/13/2023 CLINICAL DATA:  chest pain Patient fell 07/17,states she is still not feeling better and having body aches,  chest pain and shortness of breath EXAM: CHEST - 2 VIEW COMPARISON:  Chest x-ray 05/06/2022, CT chest 08/07/2023 FINDINGS: The heart and mediastinal contours are within normal limits. No focal consolidation. No pulmonary edema. No pleural effusion. No pneumothorax. No acute osseous abnormality. IMPRESSION: No active cardiopulmonary disease. Electronically Signed   By: Morgane  Naveau M.D.   On: 08/13/2023 20:30     Procedures   Medications Ordered in the ED  traMADol  (ULTRAM ) tablet 50 mg (has no administration in time range)  ondansetron  (ZOFRAN -ODT) disintegrating tablet 4 mg (4 mg Oral Given 08/14/23 0434)    Clinical Course as of 08/14/23 0558  Thu Aug 14, 2023  0552 Patient returns to the ED for persistent acute on chronic generalized pain since fall on 7/17. Labs reassuring. No sign or symptom of systemic illness. She is being referred to neurology by her PCP for further evaluation of chronic pain. Taking Tylenol  only. Will provide pain management and encourage referral. [SU]    Clinical Course User Index [SU] Odell Balls, PA-C                                 Medical Decision Making Risk Prescription drug management.        Final diagnoses:  Generalized pain    ED Discharge Orders          Ordered    traMADol  (ULTRAM ) 50 MG tablet  Every 6 hours PRN        08/14/23 0557               Odell Balls, PA-C 08/14/23 0558    Palumbo, April, MD 08/14/23 [phone]

## 2023-08-16 NOTE — Progress Notes (Unsigned)
 BH MD Outpatient Progress Note  08/16/2023 10:31 AM Erin Pollard  MRN:  [id number]  Assessment:  Erin Pollard [place] presents for follow-up evaluation. Today, 08/16/23, patient reports continued anxiety and overwhelm in the face of ongoing psychosocial and medical stressors. She has found Remeron  helpful for obtaining restorative sleep but denies minimal impact on mood or anxiety given degree of current stressors. Discussed primary benefit from therapy to increase resiliency and strengths awareness as she navigates current challenges and she is now amenable to referral to PHP/IOP. No acute safety concerns at this time. If response to therapeutic interventions is limited, could consider additional agent such as SSRI vs. SNRI (SNRI may be preferable given comorbid pain) in the future.  RTC in approx. 2 months by video.  Identifying Information: Erin WESTRY is a 53 y.o. female with a history of generalized anxiety disorder with panic attacks, HTN, past MI (2017), HLD, and IBS who is an established patient with [facility] participating in follow-up via video conferencing.   Plan:  # GAD with panic attacks Past medication trials: Zoloft, Valium (nausea), patient reports numerous others that requires further exploration Status of problem: chronic with moderate exacerbation Interventions: -- Continue mirtazapine  45 mg at bedtime (i6/11/25) -- Continue Klonopin  0.5 mg TID PRN panic attacks (patient using TID)  -- Risks of long-term use have been extensively discussed and patient expressed understanding and desire to continue as she finds it effective with absence of side effects  -- PDMP reviewed with appropriate filling -- Future considerations: would consider SNRI such as Cymbalta given comorbid pain -- Continue Vitamin D  supplementation by PCP for Vitamin D  deficiency -- Continue individual psychotherapy with Bernice Rao, [facility] -- Patient amenable to referral to  PHP/IOP for increased support  Patient was given contact information for behavioral health clinic and was instructed to call 911 for emergencies.   Subjective:  Chief Complaint:  No chief complaint on file.   Interval History:   Patient reports things have been really difficult - going to court tomorrow as she is facing eviction. Hasn't been back to work. Details numerous financial stressors. Would like to look into alternative housing but reports financial constraints. Shares recent episode of falling into a pothole; fortunately didn't have any broken bones but has been experiencing a lot of pain. Was rx tramadol  but not taking; using Tylenol  PRN.   Reports she has tolerated increase in Remeron  well; sleeping well. Denies morning grogginess.   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  Reports feeling mentally and physically exhausted and substantially overwhelmed; not sure how much more she can handle. Reports intrusive active SI (I should just end it) but denies planning/intent/desire to act on these thoughts.   Amenable to referral to PHP/IOP at this time for increased support while continuing medications as rx.  PDMP: -- Klonopin  0.5 mg QTY 90 last filled 06/02/23 (rx dating back to 2021)  Visit Diagnosis:  No diagnosis found.   Past Psychiatric History:  Diagnoses: GAD with panic attacks Medication trials: Zoloft, Valium (nausea), patient reports numerous others that requires further exploration Hospitalizations: x1 in 1996 for suicide attempt Suicide attempts: x1 in 1997 via cutting Substance use: denies use of etoh, tobacco, or illicit drugs  -- Tobacco: quit smoking 2017  Past Medical History:  Past Medical History:  Diagnosis Date   Anxiety    Cervical radiculopathy    GERD (gastroesophageal reflux disease)  Hiatal hernia 08/28/2022   [facility] [place]  was found through a CT scan and fibroids  and cyst on liver parenchyma   Hypertension    Myocardial infarction (HCC)    Panic attack     Past Surgical History:  Procedure Laterality Date   COLONOSCOPY     LEFT HEART CATH     TUBAL LIGATION     UPPER GASTROINTESTINAL ENDOSCOPY      Family Psychiatric History:  Reports 2 of her sons have struggled with substance use  Family History:  Family History  Problem Relation Age of Onset   Heart attack Mother    Cancer Father    Stroke Father    Stroke Sister    Heart disease Maternal Grandmother    Heart attack Maternal Grandmother    Heart disease Maternal Grandfather    Stroke Maternal Grandfather    Esophageal cancer Neg Hx    Colon cancer Neg Hx    Stomach cancer Neg Hx     Social History:  Social History   Socioeconomic History   Marital status: Divorced    Spouse name: Not on file   Number of children: 4   Years of education: Not on file   Highest education level: Associate degree: academic program  Occupational History   Not on file  Tobacco Use   Smoking status: Former    Current packs/day: 0.00    Types: Cigarettes    Quit date: 2017    Years since quitting: 8.5   Smokeless tobacco: Not on file  Vaping Use   Vaping status: Never Used  Substance and Sexual Activity   Alcohol use: Not Currently   Drug use: Never   Sexual activity: Yes  Other Topics Concern   Not on file  Social History Narrative   Not on file   Social Drivers of Health   Financial Resource Strain: High Risk (07/25/2023)   Overall Financial Resource Strain (CARDIA)    Difficulty of Paying Living Expenses: Very hard  Food Insecurity: Food Insecurity Present (07/25/2023)   Hunger Vital Sign    Worried About Running Out of Food in the Last Year: Sometimes true    Ran Out of Food in the Last Year: Sometimes true  Transportation Needs: Unmet Transportation Needs (07/25/2023)   PRAPARE - Transportation    Lack of Transportation (Medical): Yes    Lack of Transportation (Non-Medical):  Yes  Physical Activity: Insufficiently Active (07/25/2023)   Exercise Vital Sign    Days of Exercise per Week: 3 days    Minutes of Exercise per Session: 30 min  Stress: Stress Concern Present (07/25/2023)   [employer] of Occupational Health - Occupational Stress Questionnaire    Feeling of Stress: Very much  Social Connections: Moderately Isolated (07/25/2023)   Social Connection and Isolation Panel    Frequency of Communication with Friends and Family: More than three times a week    Frequency of Social Gatherings with Friends and Family: Three times a week    Attends Religious Services: 1 to 4 times per year    Active Member of Clubs or Organizations: No    Attends Banker Meetings: Not on file    Marital Status: Divorced    Allergies:  Allergies  Allergen Reactions   Amlodipine Hives    Achy legs and s.o.b Achy legs and s.o.b    Hydrocodone-Acetaminophen  Hives and Rash   Iodinated Contrast Media Hives   Iodine Hives and Rash   Codeine  Hydrocortisone-Iodoquinol    Propoxyphene     Current Medications: Current Outpatient Medications  Medication Sig Dispense Refill   atorvastatin  (LIPITOR) 40 MG tablet Take 1 tablet (40 mg total) by mouth at bedtime. 90 tablet 1   cholecalciferol (VITAMIN D3) 25 MCG (1000 UNIT) tablet Take 1,000 Units by mouth daily.     clonazePAM  (KLONOPIN ) 0.5 MG tablet Take 1 tablet (0.5 mg total) by mouth 3 (three) times daily. 90 tablet 2   dicyclomine  (BENTYL ) 10 MG capsule Take 1 capsule (10 mg total) by mouth every 6 (six) hours as needed for spasms (cramps). 70 capsule 1   diphenhydrAMINE  (BENADRYL  ALLERGY) 25 MG tablet Take 2 tablets (50 mg total) by mouth as directed. 1 hour prior to your CT. 30 tablet 0   Eluxadoline  (VIBERZI ) 100 MG TABS Take 1 tablet (100 mg total) by mouth 2 (two) times daily before a meal. 60 tablet 3   famotidine  (PEPCID ) 20 MG tablet Take 20 mg by mouth as needed.     ferrous sulfate  325 (65 FE) MG EC  tablet TAKE 1 TABLET BY MOUTH EVERY DAY 90 tablet 0   hydrALAZINE  (APRESOLINE ) 25 MG tablet Take 1 tablet (25 mg total) by mouth 3 (three) times daily. 270 tablet 3   hydrochlorothiazide  (MICROZIDE ) 12.5 MG capsule Take 1 capsule (12.5 mg total) by mouth daily. 90 capsule 1   KLOR-CON  M10 10 MEQ tablet Take 1 tablet (10 mEq total) by mouth daily. 90 tablet 1   labetalol  (NORMODYNE ) 100 MG tablet Take 1 tablet (100 mg total) by mouth 2 (two) times daily. 180 tablet 1   lipase/protease/amylase (CREON ) 36000 UNITS CPEP capsule Take 2 capsules (72,000 Units total) by mouth 3 (three) times daily with meals. May also take 1 capsule (36,000 Units total) as needed (with snacks - up to 2 snacks daily). 300 capsule 1   loratadine  (CLARITIN ) 10 MG tablet Take 1 tablet (10 mg total) by mouth daily. (Patient taking differently: Take 10 mg by mouth daily as needed.) 30 tablet 6   losartan  (COZAAR ) 50 MG tablet Take 1 tablet (50 mg total) by mouth daily. 90 tablet 1   mirtazapine  (REMERON ) 45 MG tablet Take 1 tablet (45 mg total) by mouth at bedtime. 30 tablet 2   naproxen  (NAPROSYN ) 500 MG tablet Take 1 tablet (500 mg total) by mouth 2 (two) times daily. (Patient taking differently: Take 500 mg by mouth as needed.) 90 tablet 1   Omega-3 Fatty Acids (FISH OIL) 1000 MG CAPS Take 1 capsule by mouth daily.     omeprazole  (PRILOSEC) 40 MG capsule Omeprazole  40 mg by mouth twice a day for 4 weeks ,then back to 40 mg once a day if that controls reflux symptoms 90 capsule 3   ondansetron  (ZOFRAN -ODT) 4 MG disintegrating tablet Take 1 tablet (4 mg total) by mouth every 8 (eight) hours as needed for nausea or vomiting. 20 tablet 0   oxymetazoline  (AFRIN NASAL SPRAY) 0.05 % nasal spray Place 1 spray into both nostrils 2 (two) times daily. 15 mL 0   predniSONE  (DELTASONE ) 50 MG tablet Take 1 tablet by mouth 13 hours prior to CT. Take 1 tablet by mouth 7 hours prior to CT. Take 1 tablet by mouth 1 hour prior to CT. 3 tablet 0    sucralfate  (CARAFATE ) 1 g tablet Take 1 tablet (1 g total) by mouth 4 (four) times daily -  with meals and at bedtime. 120 tablet 0   traMADol  (ULTRAM ) 50  MG tablet Take 1 tablet (50 mg total) by mouth every 6 (six) hours as needed. 15 tablet 0   No current facility-administered medications for this visit.    ROS: See above  Objective:  Psychiatric Specialty Exam: There were no vitals taken for this visit.There is no height or weight on file to calculate BMI.  General Appearance: Casual and Well Groomed  Eye Contact:  Good  Speech:  Clear and Coherent and Normal Rate  Volume:  Normal  Mood:  terrible  Affect:  Frustrated; dysthymic; highly anxious  Thought Content: Denies AVH; IOR; paranoia   Suicidal Thoughts:  Reports active SI when acutely overwhelmed; denies planning/desire/intent  Homicidal Thoughts:  No  Thought Process:  Goal Directed and Linear; often overly inclusive  Orientation:  Full (Time, Place, and Person)    Memory:  Grossly intact  Judgment:  Good  Insight:  Fair  Concentration:  Concentration: Good  Recall:  NA  Fund of Knowledge: Good  Language: Good  Psychomotor Activity:  Normal  Akathisia:  No  AIMS (if indicated): not done  Assets:  Communication Skills Desire for Improvement Housing Leisure Time Resilience Social Support Talents/Skills Transportation  ADL's:  Intact  Cognition: WNL  Sleep:  Good   PE: General: sits comfortably in view of camera; no acute distress  Pulm: no increased work of breathing on room air  MSK: all extremity movements appear intact  Neuro: no focal neurological deficits observed  Gait & Station: unable to assess by video    Metabolic Disorder Labs: Lab Results  Component Value Date   HGBA1C 5.6 12/11/2021   No results found for: PROLACTIN Lab Results  Component Value Date   CHOL 240 (H) 07/28/2023   TRIG 138 07/28/2023   HDL 55 07/28/2023   CHOLHDL 4.4 07/28/2023   VLDL 11 09/14/2021   LDLCALC 160  (H) 07/28/2023   LDLCALC 89 07/04/2022   No results found for: TSH  Therapeutic Level Labs: No results found for: LITHIUM No results found for: VALPROATE No results found for: CBMZ  Screenings:  GAD-7    Flowsheet Row Office Visit from 12/11/2021 in [facility] Video Visit from 10/02/2021 in [facility] Office Visit from 06/27/2021 in [facility] Office Visit from 04/19/2021 in [facility] Office Visit from 01/11/2021 in [facility]  Total GAD-7 Score 10 9 8 6  0   PHQ2-9    Flowsheet Row Office Visit from 12/11/2021 in [facility] Video Visit from 10/02/2021 in [facility] Office Visit from 06/27/2021 in [facility] Office Visit from 04/19/2021 in [facility] Office Visit from 01/11/2021 in [facility]  PHQ-2 Total Score 4 1 1 1  0  PHQ-9 Total Score 6 -- -- -- 0   Flowsheet Row ED from 08/13/2023 in [facility] at [facility] ED from 08[facility]07/2023 in [facility] Emergency Department at [facility] ED from 03/22/2023 in [facility] Department at [facility]  C-SSRS RISK CATEGORY No Risk No Risk No Risk    Collaboration of Care: Collaboration of Care: Medication Management AEB ongoing medication management, Psychiatrist AEB established with this provider, and Referral or follow-up with counselor/therapist AEB established with individual psychotherapy  Patient/Guardian was advised Release of Information must be obtained prior to any record release in order to collaborate their care with  an outside provider. Patient/Guardian was advised if they have not already done so to contact the registration department to sign all necessary forms in order for us  to release  information regarding their care.   Consent: Patient/Guardian gives verbal consent for treatment and assignment of benefits for services provided during this visit. Patient/Guardian expressed understanding and agreed to proceed.   Televisit via video: I connected with patient on 08/16/23 at  4:00 PM EDT by a video enabled telemedicine application and verified that I am speaking with the correct person using two identifiers.  Location: Patient: home address in [place] Provider: remote office in [place]   I discussed the limitations of evaluation and management by telemedicine and the availability of in person appointments. The patient expressed understanding and agreed to proceed.  I discussed the assessment and treatment plan with the patient. The patient was provided an opportunity to ask questions and all were answered. The patient agreed with the plan and demonstrated an understanding of the instructions.   The patient was advised to call back or seek an in-person evaluation if the symptoms worsen or if the condition fails to improve as anticipated.  I provided 15 minutes dedicated to the care of this patient via video on the date of this encounter to include chart review, face-to-face time with the patient, medication management/counseling.  Psychotherapy was utilized during today's session from 4:10PM-4:35PM. Therapeutic interventions included empathic listening, supportive therapy, cognitive therapy. Used supportive interviewing techniques to provide emotional validation. Worked on cognitive reframing techniques and strengths awareness.  Improvement was evidenced by patient's participation and identified commitment to therapy goals.   Darra Rosa [place] 08/16/2023, 10:31 AM

## 2023-08-18 ENCOUNTER — Telehealth (HOSPITAL_COMMUNITY): Admitting: Psychiatry

## 2023-08-18 ENCOUNTER — Encounter (HOSPITAL_COMMUNITY): Payer: Self-pay | Admitting: Psychiatry

## 2023-08-18 DIAGNOSIS — F411 Generalized anxiety disorder: Secondary | ICD-10-CM

## 2023-08-18 DIAGNOSIS — F410 Panic disorder [episodic paroxysmal anxiety] without agoraphobia: Secondary | ICD-10-CM

## 2023-08-18 MED ORDER — CLONAZEPAM 0.5 MG PO TABS
0.5000 mg | ORAL_TABLET | Freq: Three times a day (TID) | ORAL | 2 refills | Status: DC
Start: 2023-08-28 — End: 2023-10-16

## 2023-08-18 MED ORDER — MIRTAZAPINE 45 MG PO TABS: 45.0000 mg | ORAL_TABLET | Freq: Every evening | ORAL | 2 refills | Status: DC

## 2023-08-18 NOTE — Telephone Encounter (Signed)
 done

## 2023-08-18 NOTE — Telephone Encounter (Signed)
 Will forward to provider

## 2023-08-18 NOTE — Patient Instructions (Signed)

## 2023-08-18 NOTE — Telephone Encounter (Unsigned)
 Copied from CRM [id number]. Topic: General - Other >> Aug 18, 2023 11:02 AM Sophia H wrote: Reason for CRM: Patient states she is unable to access her office visit notes from 07/23 when she saw NP Rosaline Bohr and needing them asap, please advise. # [phone]

## 2023-08-20 ENCOUNTER — Encounter (HOSPITAL_COMMUNITY): Payer: Self-pay

## 2023-08-20 ENCOUNTER — Ambulatory Visit (INDEPENDENT_AMBULATORY_CARE_PROVIDER_SITE_OTHER): Admitting: Primary Care

## 2023-08-22 ENCOUNTER — Telehealth (HOSPITAL_COMMUNITY): Payer: Self-pay | Admitting: Professional

## 2023-08-22 ENCOUNTER — Encounter (HOSPITAL_COMMUNITY): Payer: Self-pay

## 2023-08-22 ENCOUNTER — Ambulatory Visit (HOSPITAL_COMMUNITY): Admitting: Mental Health

## 2023-08-22 ENCOUNTER — Telehealth (HOSPITAL_COMMUNITY): Payer: Self-pay | Admitting: Mental Health

## 2023-08-22 NOTE — Telephone Encounter (Signed)
 See call log

## 2023-08-22 NOTE — Telephone Encounter (Signed)
 Therapist connected to tele-health session in which pt reported to not feel well with headache and requested reschedule. Therapst educated would work in early next week with a no show. Educated on need to temporarily hault OPT during IOP. Pt agreeable

## 2023-08-26 ENCOUNTER — Encounter (HOSPITAL_COMMUNITY): Payer: Self-pay

## 2023-08-26 ENCOUNTER — Ambulatory Visit (INDEPENDENT_AMBULATORY_CARE_PROVIDER_SITE_OTHER): Admitting: Mental Health

## 2023-08-26 DIAGNOSIS — F410 Panic disorder [episodic paroxysmal anxiety] without agoraphobia: Secondary | ICD-10-CM | POA: Diagnosis not present

## 2023-08-26 DIAGNOSIS — F411 Generalized anxiety disorder: Secondary | ICD-10-CM | POA: Diagnosis not present

## 2023-08-26 NOTE — Progress Notes (Signed)
 THERAPIST PROGRESS NOTE Virtual Visit via Video Note  I connected with Erin Pollard on 08/26/23 at 11:00 AM EDT by a video enabled telemedicine application and verified that I am speaking with the correct person using two identifiers.  Location: Patient: home address on file Provider: office   I discussed the limitations of evaluation and management by telemedicine and the availability of in person appointments. The patient expressed understanding and agreed to proceed.  I discussed the assessment and treatment plan with the patient. The patient was provided an opportunity to ask questions and all were answered. The patient agreed with the plan and demonstrated an understanding of the instructions.   The patient was advised to call back or seek an in-person evaluation if the symptoms worsen or if the condition fails to improve as anticipated.  I provided 38 minutes of non-face-to-face time during this encounter.   Ty Bernice Savant, [facility]   Session Time: 11:06 am ( 38 minutes)  Participation Level: Active  Behavioral Response: CasualAlertDysphoric  Type of Therapy: Individual Therapy  Treatment Goals addressed: STG: Afnan will decreased presence of stressors in daily life AEB completion of needed tasks with engagement in self-care daily within the next 90 days    ProgressTowards Goals: Progressing  Interventions: Supportive  Summary: Erin Pollard is a 53 y.o. female who presents with dx of generalized anxiety disorder with panic.  Jackqueline presents to session alert and oriented; mood and affect depressed; low.Speech clear and coherent at normal rate and tone. Shares to continue not to feel well noting body pains secondary to falling in a manhole. Reports main stress related to upcoming eviction with court date of 08/27/23. Reports to feel as if landlord has been interfering with utilities in effort to encourage her to vacate home. Shares to have legal aide representation  to support in hopefully buying more time to find housing options. Denies desire for shelter resources and accepts resources for housing authority and housing coalition. Denies desire to live with family at this time in other states. Notes to only be working x 1 day a week at this time due to pain.Shares for oldest son to be visiting and is happy to have grand-children visit in the following days. Agrees to follow up on housing resources. Denies SI/HI   Suicidal/Homicidal: Nowithout intent/plan  Therapist Response: Therapist engaged [name] in tele-therapy session. Assessed for current location and ability to hold confidential session. Assessed for current level of functioning sxs management and sxs and level of stressors. Assessed for sxs of depression and anxiety and ability to cope and process thoughts in balanced manner. Provided support and encouragement; active empathic listening. Assessed for safety and presence of suicidal thoughts. Explored current options for housing and inquired of family support and shelter options locally. Provided local housing resources. Reviewed session and provided follow up. Encouraged to follow up on IOP if able.   Plan: Return again in x 7 weeks.  Diagnosis: Generalized anxiety disorder with panic attacks  Collaboration of Care: Other follow up with IOP  Patient/Guardian was advised Release of Information must be obtained prior to any record release in order to collaborate their care with an outside provider. Patient/Guardian was advised if they have not already done so to contact the registration department to sign all necessary forms in order for us  to release information regarding their care.   Consent: Patient/Guardian gives verbal consent for treatment and assignment of benefits for services provided during this visit. Patient/Guardian expressed understanding and agreed  to proceed.   Ty Asal [place], [facility] 08/26/2023

## 2023-08-28 ENCOUNTER — Ambulatory Visit (HOSPITAL_COMMUNITY): Admitting: Professional

## 2023-08-28 DIAGNOSIS — F410 Panic disorder [episodic paroxysmal anxiety] without agoraphobia: Secondary | ICD-10-CM

## 2023-08-28 DIAGNOSIS — F411 Generalized anxiety disorder: Secondary | ICD-10-CM

## 2023-09-11 ENCOUNTER — Encounter: Payer: Self-pay | Admitting: Pharmacist

## 2023-09-11 ENCOUNTER — Ambulatory Visit: Attending: Cardiology | Admitting: Pharmacist

## 2023-09-11 ENCOUNTER — Other Ambulatory Visit (HOSPITAL_COMMUNITY): Payer: Self-pay

## 2023-09-11 ENCOUNTER — Telehealth: Payer: Self-pay | Admitting: Pharmacist

## 2023-09-11 DIAGNOSIS — E7800 Pure hypercholesterolemia, unspecified: Secondary | ICD-10-CM | POA: Insufficient documentation

## 2023-09-11 DIAGNOSIS — E7801 Familial hypercholesterolemia: Secondary | ICD-10-CM | POA: Insufficient documentation

## 2023-09-11 MED ORDER — ATORVASTATIN CALCIUM 40 MG PO TABS
40.0000 mg | ORAL_TABLET | Freq: Every day | ORAL | 3 refills | Status: AC
  Filled 2023-09-11: qty 90, 90d supply, fill #0

## 2023-09-11 NOTE — Progress Notes (Signed)
 Patient ID: Erin Pollard                 DOB: 08-Apr-1970                    MRN: [id number]      HPI: Erin Pollard is a 53 y.o. female patient referred to lipid clinic by Dr. Michele  PMH is significant for anxiety, hyperlipidemia/familial hypercholesterolemia, history of myocardial infarction Jun 03, 2015) no intervention, family history of premature CAD, benign essential hypertension, GERD, former smoker, obesity.   On physical examination she was noted to have xanthelasmas and a total cholesterol was noted to be 223 and LDL at 158 mg/dL.  Once her lipid management was uptitrated lipids and xanthelasmas have improved.   She was put on Nexlizet  but her LFT was elevated post treatment so went off of Nexlizet  and now she is only on high intensity statin. She tolerates that well without any problem her last lipid lab - LDLc 160 (07/2023). Admits that she amy have missed few doses that month before the lab.   Reviewed options for lowering LDL cholesterol, including PCSK-9 inhibitors.  Discussed mechanisms of action, dosing, side effects and potential decreases in LDL cholesterol.  Also reviewed cost information and potential options for patient assistance.  Current Medications: Lipitor 40 mg daily  Intolerances: Nexlizet  - elevated AST and ALT   Risk Factors: premature MI 05/13/2017Premature CAD in mother died at age of 88 from MI, presence of xanthelasmas  LDL goal: <55 mg TG <150 mg/dl   Diet: has gastritis so limited greens but her diet is generally healthy home cooked meals   Exercise: none - Accident in 07/2023 recovering from that   Family History:  Relation Problem Comments  Mother (Deceased at age 33) Heart attack     Father (Deceased at age 59) Cancer   Stroke     Sister Metallurgist) Stroke     Sister Metallurgist)   Sister Metallurgist)   Brother (Alive)   Maternal Grandmother Heart attack   Heart disease     Maternal Grandfather Heart disease   Stroke      Social History:  Alcohol:  none  Smoking: none  Labs:  Lipid Panel     Component Value Date/Time   CHOL 240 (H) 07/28/2023 0846   TRIG 138 07/28/2023 0846   HDL 55 07/28/2023 0846   CHOLHDL 4.4 07/28/2023 0846   CHOLHDL 5.0 09/14/2021 0128   VLDL 11 09/14/2021 0128   LDLCALC 160 (H) 07/28/2023 0846   LDLDIRECT 84 07/04/2022 0801   LDLDIRECT 169 (H) 09/14/2021 0333   LABVLDL 25 07/28/2023 0846    Past Medical History:  Diagnosis Date   Anxiety    Cervical radiculopathy    GERD (gastroesophageal reflux disease)    Hiatal hernia 08/28/2022   [facility] [place]  was found through a CT scan and fibroids and cyst on liver parenchyma   Hypertension    Myocardial infarction (HCC)    Panic attack     Current Outpatient Medications on File Prior to Visit  Medication Sig Dispense Refill   cholecalciferol (VITAMIN D3) 25 MCG (1000 UNIT) tablet Take 1,000 Units by mouth daily.     clonazePAM  (KLONOPIN ) 0.5 MG tablet Take 1 tablet (0.5 mg total) by mouth 3 (three) times daily. 90 tablet 2   dicyclomine  (BENTYL ) 10 MG capsule Take 1 capsule (10 mg total) by mouth every 6 (six) hours as needed for spasms (cramps). 70  capsule 1   diphenhydrAMINE  (BENADRYL  ALLERGY) 25 MG tablet Take 2 tablets (50 mg total) by mouth as directed. 1 hour prior to your CT. 30 tablet 0   Eluxadoline  (VIBERZI ) 100 MG TABS Take 1 tablet (100 mg total) by mouth 2 (two) times daily before a meal. 60 tablet 3   famotidine  (PEPCID ) 20 MG tablet Take 20 mg by mouth as needed.     ferrous sulfate  325 (65 FE) MG EC tablet TAKE 1 TABLET BY MOUTH EVERY DAY 90 tablet 0   hydrALAZINE  (APRESOLINE ) 25 MG tablet Take 1 tablet (25 mg total) by mouth 3 (three) times daily. 270 tablet 3   hydrochlorothiazide  (MICROZIDE ) 12.5 MG capsule Take 1 capsule (12.5 mg total) by mouth daily. 90 capsule 1   KLOR-CON  M10 10 MEQ tablet Take 1 tablet (10 mEq total) by mouth daily. 90 tablet 1   labetalol  (NORMODYNE ) 100 MG tablet Take 1 tablet (100 mg total) by  mouth 2 (two) times daily. 180 tablet 1   lipase/protease/amylase (CREON ) 36000 UNITS CPEP capsule Take 2 capsules (72,000 Units total) by mouth 3 (three) times daily with meals. May also take 1 capsule (36,000 Units total) as needed (with snacks - up to 2 snacks daily). 300 capsule 1   loratadine  (CLARITIN ) 10 MG tablet Take 1 tablet (10 mg total) by mouth daily. (Patient taking differently: Take 10 mg by mouth daily as needed.) 30 tablet 6   losartan  (COZAAR ) 50 MG tablet Take 1 tablet (50 mg total) by mouth daily. 90 tablet 1   mirtazapine  (REMERON ) 45 MG tablet Take 1 tablet (45 mg total) by mouth at bedtime. 30 tablet 2   naproxen  (NAPROSYN ) 500 MG tablet Take 1 tablet (500 mg total) by mouth 2 (two) times daily. (Patient taking differently: Take 500 mg by mouth as needed.) 90 tablet 1   Omega-3 Fatty Acids (FISH OIL) 1000 MG CAPS Take 1 capsule by mouth daily.     omeprazole  (PRILOSEC) 40 MG capsule Omeprazole  40 mg by mouth twice a day for 4 weeks ,then back to 40 mg once a day if that controls reflux symptoms 90 capsule 3   ondansetron  (ZOFRAN -ODT) 4 MG disintegrating tablet Take 1 tablet (4 mg total) by mouth every 8 (eight) hours as needed for nausea or vomiting. 20 tablet 0   oxymetazoline  (AFRIN NASAL SPRAY) 0.05 % nasal spray Place 1 spray into both nostrils 2 (two) times daily. 15 mL 0   predniSONE  (DELTASONE ) 50 MG tablet Take 1 tablet by mouth 13 hours prior to CT. Take 1 tablet by mouth 7 hours prior to CT. Take 1 tablet by mouth 1 hour prior to CT. 3 tablet 0   sucralfate  (CARAFATE ) 1 g tablet Take 1 tablet (1 g total) by mouth 4 (four) times daily -  with meals and at bedtime. 120 tablet 0   traMADol  (ULTRAM ) 50 MG tablet Take 1 tablet (50 mg total) by mouth every 6 (six) hours as needed. 15 tablet 0   No current facility-administered medications on file prior to visit.    Allergies  Allergen Reactions   Amlodipine Hives    Achy legs and s.o.b Achy legs and s.o.b     Hydrocodone-Acetaminophen  Hives and Rash   Iodinated Contrast Media Hives   Iodine Hives and Rash   Codeine    Hydrocortisone-Iodoquinol    Propoxyphene     Assessment/Plan:  1. Hyperlipidemia -  Problem  Familial Hypercholesteremia   Current Medications: Lipitor 40 mg daily  Intolerances: Nexlizet  - elevated AST and ALT   Risk Factors: premature MI 05-11-17Premature CAD in mother died at age of 78 from MI, presence of xanthelasmas  LDL goal: <55 mg TG <150 mg/dl     Familial hypercholesteremia Assessment:  LDL goal: < 55 mg/dl TG <849 mg/dl last LDLc 839 mg/dl (92/7974) Tolerates high intensity statins well without any side effects  Intolerance to high intensity/moderate intensity /low intensity  statins  LFT elevation on Nexlizet   Discussed next potential options PCSK-9 inhibitors; cost, dosing efficacy, side effects   Plan: Continue taking current medications (Lipitor 40 gm daily ) Will apply for PA for PCSK9i; will inform patient upon approval  Will get Lp(a) checked today and Direct LDL as there was some non adherence to statins before last lipid lab  reported by patient   Lipid lab due in 2-3 months after starting PCSK9i    Thank you,  Robbi Blanch, Pharm.D [place] [facility]. [facility] [address], [place], [place] [address] Phone: [phone]; Fax: [phone]

## 2023-09-11 NOTE — Assessment & Plan Note (Signed)
 Assessment:  LDL goal: < 55 mg/dl TG <849 mg/dl last LDLc 839 mg/dl (92/7974) Tolerates high intensity statins well without any side effects  Intolerance to high intensity/moderate intensity /low intensity  statins  LFT elevation on Nexlizet   Discussed next potential options PCSK-9 inhibitors; cost, dosing efficacy, side effects   Plan: Continue taking current medications (Lipitor 40 gm daily ) Will apply for PA for PCSK9i; will inform patient upon approval  Will get Lp(a) checked today and Direct LDL as there was some non adherence to statins before last lipid lab  reported by patient   Lipid lab due in 2-3 months after starting PCSK9i

## 2023-09-11 NOTE — Patient Instructions (Signed)
   Medication changes: continue taking Lipitor 40 mg daily and we will start the process to get PCSK9i ( Repatha or Praluent)  covered by your insurance.  Once the prior authorization is complete, we will call you to let you know and confirm pharmacy information.    Praluent is a cholesterol medication that improved your body's ability to get rid of bad cholesterol known as LDL. It can lower your LDL up to 60%. It is an injection that is given under the skin every 2 weeks. The most common side effects of Praluent include runny nose, symptoms of the common cold, rarely flu or flu-like symptoms, back/muscle pain in about 3-4% of the patients, and redness, pain, or bruising at the injection site.    Repatha is a cholesterol medication that improved your body's ability to get rid of bad cholesterol known as LDL. It can lower your LDL up to 60%! It is an injection that is given under the skin every 2 weeks. The medication often requires a prior authorization from your insurance company. The most common side effects of Repatha include runny nose, symptoms of the common cold, rarely flu or flu-like symptoms, back/muscle pain in about 3-4% of the patients, and redness, pain, or bruising at the injection site.   Lab orders: We want to repeat labs after 2-3 months.  We will send you a lab order to remind you once we get closer to that time.

## 2023-09-12 ENCOUNTER — Other Ambulatory Visit (HOSPITAL_COMMUNITY): Payer: Self-pay

## 2023-09-12 ENCOUNTER — Telehealth: Payer: Self-pay | Admitting: Pharmacy Technician

## 2023-09-12 NOTE — Telephone Encounter (Signed)
 Pharmacy Patient Advocate Encounter  Received notification from [facility] that Prior Authorization for repatha has been APPROVED from 09/12/23 to 09/11/24. Ran test claim, Copay is $4.00. This test claim was processed through [facility]- copay amounts may vary at other pharmacies due to pharmacy/plan contracts, or as the patient moves through the different stages of their insurance plan.   PA #/Case ID/Reference #: [id number]

## 2023-09-12 NOTE — Telephone Encounter (Signed)
 Per pt calls  Pharmacy Patient Advocate Encounter   Received notification from Pt Calls Messages that prior authorization for repatha is required/requested.   Insurance verification completed.   The patient is insured through [facility] .   Per test claim: PA required; PA submitted to above mentioned insurance via Latent Key/confirmation #/EOC [id number] Status is pending

## 2023-09-13 LAB — LDL CHOLESTEROL, DIRECT: LDL Direct: 137 mg/dL — ABNORMAL HIGH (ref 0–99)

## 2023-09-13 LAB — LIPOPROTEIN A (LPA): Lipoprotein (a): 659 nmol/L — ABNORMAL HIGH (ref <75.0)

## 2023-09-16 ENCOUNTER — Ambulatory Visit (INDEPENDENT_AMBULATORY_CARE_PROVIDER_SITE_OTHER): Admitting: Neurology

## 2023-09-16 ENCOUNTER — Ambulatory Visit: Payer: Self-pay | Admitting: Pharmacist

## 2023-09-16 ENCOUNTER — Encounter: Payer: Self-pay | Admitting: Neurology

## 2023-09-16 VITALS — BP 144/92 | HR 75 | Ht 60.0 in | Wt 166.0 lb

## 2023-09-16 DIAGNOSIS — R202 Paresthesia of skin: Secondary | ICD-10-CM

## 2023-09-16 DIAGNOSIS — E7801 Familial hypercholesterolemia: Secondary | ICD-10-CM

## 2023-09-16 DIAGNOSIS — M47812 Spondylosis without myelopathy or radiculopathy, cervical region: Secondary | ICD-10-CM | POA: Diagnosis not present

## 2023-09-16 DIAGNOSIS — R292 Abnormal reflex: Secondary | ICD-10-CM | POA: Diagnosis not present

## 2023-09-16 MED ORDER — REPATHA SURECLICK 140 MG/ML ~~LOC~~ SOAJ
140.0000 mg | SUBCUTANEOUS | 3 refills | Status: AC
Start: 2023-09-16 — End: ?

## 2023-09-16 NOTE — Addendum Note (Signed)
 Addended by: Tanyah Debruyne K on: 09/16/2023 07:17 AM   Modules accepted: Orders

## 2023-09-16 NOTE — Patient Instructions (Addendum)
 MRI cervical spine Nerve testing of the right arm and leg Follow-up with primary care provider for generalized pain  ELECTROMYOGRAM AND NERVE CONDUCTION STUDIES (EMG/NCS) INSTRUCTIONS  How to Prepare The neurologist conducting the EMG will need to know if you have certain medical conditions. Tell the neurologist and other EMG lab personnel if you: Have a pacemaker or any other electrical medical device Take blood-thinning medications Have hemophilia, a blood-clotting disorder that causes prolonged bleeding Bathing Take a shower or bath shortly before your exam in order to remove oils from your skin. Don't apply lotions or creams before the exam.  What to Expect You'll likely be asked to change into a hospital gown for the procedure and lie down on an examination table. The following explanations can help you understand what will happen during the exam.  Electrodes. The neurologist or a technician places surface electrodes at various locations on your skin depending on where you're experiencing symptoms. Or the neurologist may insert needle electrodes at different sites depending on your symptoms.  Sensations. The electrodes will at times transmit a tiny electrical current that you may feel as a twinge or spasm. The needle electrode may cause discomfort or pain that usually ends shortly after the needle is removed. If you are concerned about discomfort or pain, you may want to talk to the neurologist about taking a short break during the exam.  Instructions. During the needle EMG, the neurologist will assess whether there is any spontaneous electrical activity when the muscle is at rest - activity that isn't present in healthy muscle tissue - and the degree of activity when you slightly contract the muscle.  He or she will give you instructions on resting and contracting a muscle at appropriate times. Depending on what muscles and nerves the neurologist is examining, he or she may ask you to change  positions during the exam.  After your EMG You may experience some temporary, minor bruising where the needle electrode was inserted into your muscle. This bruising should fade within several days. If it persists, contact your primary care doctor.

## 2023-09-16 NOTE — Progress Notes (Addendum)
 [facility] Clinic Note - Initial Visit   Date: 09/16/2023   Erin Pollard MRN: [id number] DOB: 1970/09/22   Dear Rosaline Bohr, NP:  Thank you for your kind referral of Erin Pollard for consultation of right arm pain. Although her history is well known to you, please allow us  to reiterate it for the purpose of our medical record. The patient was accompanied to the clinic by self.    Erin Pollard is a 53 y.o. right-handed female with hyperlipidemia, depression, GERD, hypertension, and chronic pain syndrome presenting for evaluation of right arm pain.   IMPRESSION/PLAN: Generalized pain and myalgias, worse over the right arm.  Prior testing from 2022 [facility], [place]) shows chronic C5-6 radiculopathy and MRI cervical spine shows neuroforaminal narrowing at C3-4, C4-5, C5-6, and C6-7.  She reports worsening pain since her fall, so I will repeat testing to look for interval change.   - NCS/EMG of the right arm and leg  - MRI cervical spine wo contrast  - Patient has history of chronic pain syndrome.  She was informed that my office does not treat chronic pain and she may want to see pain management.   Further recommendations pending results.   ------------------------------------------------------------- History of present illness: In July  2025, she had a fall into a manhole which she states was was a fiber optic cable box.  She was unable to get out of this and eventually a police officer came to her aide.  She was complaining of generalized pain in the chest, knees, hip, shoulder, neck, and thus went to the ER.    Since her fall, she has worsening right side pain, especially in the arm.  She had generalized pain in the hands and legs.  Pain is described as achy.    She was seeing pain management when living in [facility].  Notes were reviewed and indicate prior testing from 2022 [facility], [place]) shows chronic C5-6 radiculopathy and mild right  carpal tunnel syndrome.  MRI cervical spine shows neuroforaminal narrowing at C3-4, C4-5, C5-6, and C6-7.  Out-side paper records, electronic medical record, and images have been reviewed where available and summarized as:  Lab Results  Component Value Date   HGBA1C 5.6 12/11/2021   No results found for: VITAMINB12 No results found for: TSH No results found for: ESRSEDRATE, POCTSEDRATE  Past Medical History:  Diagnosis Date   Anxiety    Cervical radiculopathy    GERD (gastroesophageal reflux disease)    Hiatal hernia 08/28/2022   [facility] [place]  was found through a CT scan and fibroids and cyst on liver parenchyma   Hypertension    Myocardial infarction (HCC)    Panic attack     Past Surgical History:  Procedure Laterality Date   COLONOSCOPY     LEFT HEART CATH     TUBAL LIGATION     UPPER GASTROINTESTINAL ENDOSCOPY       Medications:  Outpatient Encounter Medications as of 09/16/2023  Medication Sig   atorvastatin  (LIPITOR) 40 MG tablet Take 1 tablet (40 mg total) by mouth daily.   cholecalciferol (VITAMIN D3) 25 MCG (1000 UNIT) tablet Take 1,000 Units by mouth daily.   clonazePAM  (KLONOPIN ) 0.5 MG tablet Take 1 tablet (0.5 mg total) by mouth 3 (three) times daily.   dicyclomine  (BENTYL ) 10 MG capsule Take 1 capsule (10 mg total) by mouth every 6 (six) hours as needed for spasms (cramps).   diphenhydrAMINE  (BENADRYL  ALLERGY) 25 MG tablet  Take 2 tablets (50 mg total) by mouth as directed. 1 hour prior to your CT.   Eluxadoline  (VIBERZI ) 100 MG TABS Take 1 tablet (100 mg total) by mouth 2 (two) times daily before a meal.   Evolocumab  (REPATHA  SURECLICK) 140 MG/ML SOAJ Inject 140 mg into the skin every 14 (fourteen) days.   famotidine  (PEPCID ) 20 MG tablet Take 20 mg by mouth as needed.   ferrous sulfate  325 (65 FE) MG EC tablet TAKE 1 TABLET BY MOUTH EVERY DAY   hydrALAZINE  (APRESOLINE ) 25 MG tablet Take 1 tablet (25 mg total) by mouth 3 (three) times  daily.   hydrochlorothiazide  (MICROZIDE ) 12.5 MG capsule Take 1 capsule (12.5 mg total) by mouth daily.   KLOR-CON  M10 10 MEQ tablet Take 1 tablet (10 mEq total) by mouth daily.   labetalol  (NORMODYNE ) 100 MG tablet Take 1 tablet (100 mg total) by mouth 2 (two) times daily.   lipase/protease/amylase (CREON ) 36000 UNITS CPEP capsule Take 2 capsules (72,000 Units total) by mouth 3 (three) times daily with meals. May also take 1 capsule (36,000 Units total) as needed (with snacks - up to 2 snacks daily).   loratadine  (CLARITIN ) 10 MG tablet Take 1 tablet (10 mg total) by mouth daily.   losartan  (COZAAR ) 50 MG tablet Take 1 tablet (50 mg total) by mouth daily.   mirtazapine  (REMERON ) 45 MG tablet Take 1 tablet (45 mg total) by mouth at bedtime.   Omega-3 Fatty Acids (FISH OIL) 1000 MG CAPS Take 1 capsule by mouth daily.   omeprazole  (PRILOSEC) 40 MG capsule Omeprazole  40 mg by mouth twice a day for 4 weeks ,then back to 40 mg once a day if that controls reflux symptoms   ondansetron  (ZOFRAN -ODT) 4 MG disintegrating tablet Take 1 tablet (4 mg total) by mouth every 8 (eight) hours as needed for nausea or vomiting.   oxymetazoline  (AFRIN NASAL SPRAY) 0.05 % nasal spray Place 1 spray into both nostrils 2 (two) times daily.   sucralfate  (CARAFATE ) 1 g tablet Take 1 tablet (1 g total) by mouth 4 (four) times daily -  with meals and at bedtime.   traMADol  (ULTRAM ) 50 MG tablet Take 1 tablet (50 mg total) by mouth every 6 (six) hours as needed.   naproxen  (NAPROSYN ) 500 MG tablet Take 1 tablet (500 mg total) by mouth 2 (two) times daily. (Patient not taking: Reported on 09/16/2023)   predniSONE  (DELTASONE ) 50 MG tablet Take 1 tablet by mouth 13 hours prior to CT. Take 1 tablet by mouth 7 hours prior to CT. Take 1 tablet by mouth 1 hour prior to CT. (Patient not taking: Reported on 09/16/2023)   No facility-administered encounter medications on file as of 09/16/2023.    Allergies:  Allergies  Allergen  Reactions   Amlodipine Hives    Achy legs and s.o.b Achy legs and s.o.b    Hydrocodone-Acetaminophen  Hives and Rash   Iodinated Contrast Media Hives   Iodine Hives and Rash   Codeine    Hydrocortisone-Iodoquinol    Propoxyphene     Family History: Family History  Problem Relation Age of Onset   Heart attack Mother    Cancer Father    Stroke Father    Stroke Sister    Heart disease Maternal Grandmother    Heart attack Maternal Grandmother    Heart disease Maternal Grandfather    Stroke Maternal Grandfather    Esophageal cancer Neg Hx    Colon cancer Neg Hx    Stomach  cancer Neg Hx     Social History: Social History   Tobacco Use   Smoking status: Former    Current packs/day: 0.00    Types: Cigarettes    Quit date: 2017    Years since quitting: 8.6  Vaping Use   Vaping status: Never Used  Substance Use Topics   Alcohol use: Not Currently   Drug use: Never   Social History   Social History Narrative   Are you right handed or left handed? Right Handed   Are you currently employed ? No    What is your current occupation? No.    Do you live at home alone?No    Who lives with you? Son    What type of home do you live in: 1 story or 2 story? Ranch style home. One step into home.         Vital Signs:  BP (!) 176/94   Pulse 75   Ht 5' (1.524 m)   Wt 166 lb (75.3 kg)   SpO2 100%   BMI 32.42 kg/m     Neurological Exam: MENTAL STATUS including orientation to time, place, person, recent and remote memory, attention span and concentration, language, and fund of knowledge is normal.  Speech is not dysarthric.  CRANIAL NERVES: II:  No visual field defects.     III-IV-VI: Pupils equal round and reactive to light.  Normal conjugate, extra-ocular eye movements in all directions of gaze.  No nystagmus.  No ptosis.   V:  Normal facial sensation.    VII:  Normal facial symmetry and movements.   VIII:  Normal hearing and vestibular function.   IX-X:  Normal palatal  movement.   XI:  Normal shoulder shrug and head rotation.   XII:  Normal tongue strength and range of motion, no deviation or fasciculation.  MOTOR:  Motor strength is 5/5 throughout. No atrophy, fasciculations or abnormal movements.  No pronator drift.   MSRs:                                           Right        Left brachioradialis 2+  2+  biceps 2+  2+  triceps 2+  2+  patellar 3+  3+  ankle jerk 2+  2+  Hoffman no  no  plantar response down  down   SENSORY:  Normal and symmetric perception of light touch, pinprick, vibration, and temperature.   COORDINATION/GAIT: Normal finger-to- nose-finger.  Intact rapid alternating movements bilaterally..  Gait narrow based and stable.    Thank you for allowing me to participate in patient's care.  If I can answer any additional questions, I would be pleased to do so.    Sincerely,    Laurian Edrington K. Tobie, DO

## 2023-09-17 NOTE — Telephone Encounter (Signed)
 PA for Repatha  has been approved see other encounter for more info.
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
## 2023-09-18 NOTE — Telephone Encounter (Signed)
 PA approved see other encounter for more info

## 2023-09-23 ENCOUNTER — Encounter: Payer: Self-pay | Admitting: Neurology

## 2023-09-25 ENCOUNTER — Ambulatory Visit
Admission: RE | Admit: 2023-09-25 | Discharge: 2023-09-25 | Disposition: A | Discharge status: Home / Self Care | Source: Ambulatory Visit | Attending: Neurology | Admitting: Neurology

## 2023-09-25 DIAGNOSIS — M47812 Spondylosis without myelopathy or radiculopathy, cervical region: Secondary | ICD-10-CM | POA: Diagnosis not present

## 2023-09-25 DIAGNOSIS — R292 Abnormal reflex: Secondary | ICD-10-CM

## 2023-09-25 DIAGNOSIS — R202 Paresthesia of skin: Secondary | ICD-10-CM

## 2023-10-07 ENCOUNTER — Ambulatory Visit: Admitting: Neurology

## 2023-10-07 ENCOUNTER — Ambulatory Visit (INDEPENDENT_AMBULATORY_CARE_PROVIDER_SITE_OTHER): Admitting: Primary Care

## 2023-10-07 ENCOUNTER — Ambulatory Visit: Payer: Self-pay | Admitting: Neurology

## 2023-10-08 NOTE — Telephone Encounter (Signed)
 Patient wants a call back today about results please call

## 2023-10-09 ENCOUNTER — Other Ambulatory Visit: Payer: Self-pay

## 2023-10-09 ENCOUNTER — Telehealth (INDEPENDENT_AMBULATORY_CARE_PROVIDER_SITE_OTHER): Payer: Self-pay | Admitting: *Deleted

## 2023-10-09 DIAGNOSIS — R292 Abnormal reflex: Secondary | ICD-10-CM

## 2023-10-09 DIAGNOSIS — M47812 Spondylosis without myelopathy or radiculopathy, cervical region: Secondary | ICD-10-CM

## 2023-10-09 DIAGNOSIS — R202 Paresthesia of skin: Secondary | ICD-10-CM

## 2023-10-09 NOTE — Telephone Encounter (Signed)
 Called patient in regards to rescheduling her appointment due to PCP being out to the end of the month. She stated that she had paperwork she needed to have completed by PCP for LOA/ FMLA.   Advised patient that I would ask another provider to see if paperwork is able to be complete and they responded : Unfortunately she will need to be rescheduled with PCP for the [facility] paperwork as it looks like she has chronic pain and PCP knows her best.  Will forward message to Neurology to see if forms are able to be completed.

## 2023-10-10 NOTE — Telephone Encounter (Signed)
 Spoke with Erin Pollard and informed of message from covering provider and specialist.  Scheduled appointment with PCP.  Letter was sent to patient for employer.

## 2023-10-13 ENCOUNTER — Ambulatory Visit: Admitting: Neurology

## 2023-10-14 ENCOUNTER — Ambulatory Visit (INDEPENDENT_AMBULATORY_CARE_PROVIDER_SITE_OTHER): Admitting: Primary Care

## 2023-10-14 NOTE — Progress Notes (Unsigned)
 BH MD Outpatient Progress Note  10/16/2023 3:37 PM Erin Pollard  MRN:  [id number]  Assessment:  Erin Pollard presents for follow-up evaluation. Today, 10/16/23, patient reports recent stressors including eviction from her apartment with police involvement. She is now staying at her daughter's home and while this has provided relief in many ways, she identifies this as only temporary housing arrangement due to their own complicated history. She does share positive news of approval for SSI. She feels that mood has remained overall stable although reports persistent anxiety. She has self-decreased Remeron  back to 30 mg dosing due to concern for oversedation at 45 mg dose. Explored additional options for treatment of anxiety, in particular SNRI given comorbid pain. She reports past intolerability to Cymbalta in the past and in general notes sensitivity to side effects with new medications. She reports she will be getting established with pain management in the near-future and opts to continue medications as currently prescribed to see how she tolerates pain management options and how better pain control impacts anxiety. No changes to plan of care at this time.  RTC in approx. 2 months by video.  Patient was made aware of this provider's departure from [facility] at the end of Nov 2025 and that she will be transitioned to alternative provider in the clinic after this time. All questions/concerns addressed.  Identifying Information: Erin Pollard is a 53 y.o. female with a history of generalized anxiety disorder with panic attacks, HTN, past MI (2017), HLD, and IBS who is an established patient with [facility] participating in follow-up via video conferencing.   Plan:  # GAD with panic attacks Past medication trials: Zoloft, Valium (nausea), patient reports numerous others that requires further exploration Status of problem: chronic; persistent Interventions: --  Continue mirtazapine  30 mg at bedtime (i6/11/25)  -- Patient self-reduced from 45 to 30 mg nightly due to oversedation at higher dosing -- Continue Klonopin  0.5 mg TID PRN panic attacks (patient using TID)  -- Risks of long-term use have been extensively discussed and patient expressed understanding and desire to continue as she finds it effective with absence of side effects. It is felt that benefits outweigh risks at this time.  -- PDMP reviewed with appropriate filling -- Future considerations: would consider SNRI such as Effexor given comorbid pain (patient reports past side effects with Cymbalta) -- Continue Vitamin D  supplementation by PCP for Vitamin D  deficiency -- Continue individual psychotherapy with Erin Pollard, [facility] -- Patient previously referred to PHP/IOP for increased support and intake completed 08/28/23; unfortunately patient unable to participate at this time given time commitment  Patient was given contact information for behavioral health clinic and was instructed to call 911 for emergencies.   Subjective:  Chief Complaint:  Chief Complaint  Patient presents with   Medication Management    Interval History:   Chart review: -- Completed initial intake for PHP/IOP 08/28/23   Patient reports she was evicted last Wednesday and now staying at [employer]. Shares court proceedings leading up to this and how sheriffs threw her out of the apartment in the early morning. Since being at [facility], states things have been fine but knows they have their own history and that this living situation will only be temporary. Thus far, things have been peaceful. Was approved for some SSI which provides some relief.   Describes mood as numb lately but denies low mood or easy tearfulness. Notes improvement in mood since getting out of old apartment. Has  not worked since May 2025. Anxiety is fairly constant and often worrying that something else bad will happen.   Feels that Remeron   45 mg was making her feel loopy so returned to old script of 30 mg. Continuing to sleep well without oversedation at this dose.   Continues to deal with a lot of MSK and neuropathic pain. Will be having NCS.   Given persistence of anxiety, explored additional medication options. Reports she has tried Cymbalta but did not like the way this made her feel. She reports trouble tolerating numerous medications in the past and is fearful of side effects. She states she will be starting pain management soon and would prefer to wait to see what medications they recommend to see how pain management impacts her symptoms.   Reports she had attended intake for PHP/IOP but ultimately was not able to participate due to time commitment from court proceedings.  Given recent psychosocial changes and upcoming pain management appt, opts to continue current regimen as prescribed.   PDMP: -- Klonopin  0.5 mg QTY 90 last filled 06/02/23 (rx dating back to 2021)  Visit Diagnosis:    ICD-10-CM   1. Generalized anxiety disorder with panic attacks  F41.1 clonazePAM  (KLONOPIN ) 0.5 MG tablet   F41.0 mirtazapine  (REMERON ) 30 MG tablet      Past Psychiatric History:  Diagnoses: GAD with panic attacks Medication trials: Zoloft, Cymbalta (out of it), Valium (nausea), patient reports numerous others that requires further exploration Hospitalizations: x1 in 1996 for suicide attempt Suicide attempts: x1 in 1997 via cutting Substance use: denies use of etoh, tobacco, or illicit drugs  -- Tobacco: quit smoking 2017  Past Medical History:  Past Medical History:  Diagnosis Date   Anxiety    Cervical radiculopathy    GERD (gastroesophageal reflux disease)    Hiatal hernia 08/28/2022   [facility]  was found through a CT scan and fibroids and cyst on liver parenchyma   Hypertension    Myocardial infarction (HCC)    Panic attack     Past Surgical History:  Procedure Laterality Date   COLONOSCOPY      LEFT HEART CATH     TUBAL LIGATION     UPPER GASTROINTESTINAL ENDOSCOPY      Family Psychiatric History:  Reports 2 of her sons have struggled with substance use  Family History:  Family History  Problem Relation Age of Onset   Heart attack Mother    Cancer Father    Stroke Father    Stroke Sister    Heart disease Maternal Grandmother    Heart attack Maternal Grandmother    Heart disease Maternal Grandfather    Stroke Maternal Grandfather    Esophageal cancer Neg Hx    Colon cancer Neg Hx    Stomach cancer Neg Hx     Social History:  Social History   Socioeconomic History   Marital status: Divorced    Spouse name: Not on file   Number of children: 4   Years of education: Not on file   Highest education level: Associate degree: academic program  Occupational History   Not on file  Tobacco Use   Smoking status: Former    Current packs/day: 0.00    Types: Cigarettes    Quit date: 2017    Years since quitting: 8.7   Smokeless tobacco: Not on file  Vaping Use   Vaping status: Never Used  Substance and Sexual Activity   Alcohol use: Not Currently   Drug  use: Never   Sexual activity: Yes  Other Topics Concern   Not on file  Social History Narrative   Are you right handed or left handed? Right Handed   Are you currently employed ? No    What is your current occupation? No.    Do you live at home alone?No    Who lives with you? Son    What type of home do you live in: 1 story or 2 story? Ranch style home. One step into home.        Social Drivers of Corporate investment banker Strain: High Risk (07/25/2023)   Overall Financial Resource Strain (CARDIA)    Difficulty of Paying Living Expenses: Very hard  Food Insecurity: Food Insecurity Present (07/25/2023)   Hunger Vital Sign    Worried About Running Out of Food in the Last Year: Sometimes true    Ran Out of Food in the Last Year: Sometimes true  Transportation Needs: Unmet Transportation Needs (07/25/2023)    PRAPARE - Administrator, Civil Service (Medical): Yes    Lack of Transportation (Non-Medical): Yes  Physical Activity: Insufficiently Active (07/25/2023)   Exercise Vital Sign    Days of Exercise per Week: 3 days    Minutes of Exercise per Session: 30 min  Stress: Stress Concern Present (07/25/2023)   [employer] of Occupational Health - Occupational Stress Questionnaire    Feeling of Stress: Very much  Social Connections: Moderately Isolated (07/25/2023)   Social Connection and Isolation Panel    Frequency of Communication with Friends and Family: More than three times a week    Frequency of Social Gatherings with Friends and Family: Three times a week    Attends Religious Services: 1 to 4 times per year    Active Member of Clubs or Organizations: No    Attends Banker Meetings: Not on file    Marital Status: Divorced    Allergies:  Allergies  Allergen Reactions   Amlodipine Hives    Achy legs and s.o.b Achy legs and s.o.b    Hydrocodone-Acetaminophen  Hives and Rash   Iodinated Contrast Media Hives   Iodine Hives and Rash   Codeine    Hydrocortisone-Iodoquinol    Propoxyphene     Current Medications: Current Outpatient Medications  Medication Sig Dispense Refill   atorvastatin  (LIPITOR) 40 MG tablet Take 1 tablet (40 mg total) by mouth daily. 90 tablet 3   cholecalciferol (VITAMIN D3) 25 MCG (1000 UNIT) tablet Take 1,000 Units by mouth daily.     [START ON 10/29/2023] clonazePAM  (KLONOPIN ) 0.5 MG tablet Take 1 tablet (0.5 mg total) by mouth 3 (three) times daily. 90 tablet 2   dicyclomine  (BENTYL ) 10 MG capsule Take 1 capsule (10 mg total) by mouth every 6 (six) hours as needed for spasms (cramps). 70 capsule 1   diphenhydrAMINE  (BENADRYL  ALLERGY) 25 MG tablet Take 2 tablets (50 mg total) by mouth as directed. 1 hour prior to your CT. 30 tablet 0   Eluxadoline  (VIBERZI ) 100 MG TABS Take 1 tablet (100 mg total) by mouth 2 (two) times daily before a  meal. 60 tablet 3   Evolocumab  (REPATHA  SURECLICK) 140 MG/ML SOAJ Inject 140 mg into the skin every 14 (fourteen) days. 6 mL 3   famotidine  (PEPCID ) 20 MG tablet Take 20 mg by mouth as needed.     ferrous sulfate  325 (65 FE) MG EC tablet TAKE 1 TABLET BY MOUTH EVERY DAY 90 tablet 0  hydrALAZINE  (APRESOLINE ) 25 MG tablet Take 1 tablet (25 mg total) by mouth 3 (three) times daily. 270 tablet 2   hydrochlorothiazide  (MICROZIDE ) 12.5 MG capsule Take 1 capsule (12.5 mg total) by mouth daily. 90 capsule 2   KLOR-CON  M10 10 MEQ tablet Take 1 tablet (10 mEq total) by mouth daily. 90 tablet 1   labetalol  (NORMODYNE ) 100 MG tablet Take 1 tablet (100 mg total) by mouth 2 (two) times daily. 180 tablet 2   lipase/protease/amylase (CREON ) 36000 UNITS CPEP capsule Take 2 capsules (72,000 Units total) by mouth 3 (three) times daily with meals. May also take 1 capsule (36,000 Units total) as needed (with snacks - up to 2 snacks daily). 300 capsule 1   loratadine  (CLARITIN ) 10 MG tablet Take 1 tablet (10 mg total) by mouth daily. 30 tablet 6   losartan  (COZAAR ) 50 MG tablet Take 1 tablet (50 mg total) by mouth daily. 90 tablet 2   mirtazapine  (REMERON ) 30 MG tablet Take 1 tablet (30 mg total) by mouth at bedtime. 30 tablet 2   naproxen  (NAPROSYN ) 500 MG tablet Take 1 tablet (500 mg total) by mouth 2 (two) times daily. (Patient not taking: Reported on 09/16/2023) 90 tablet 1   Omega-3 Fatty Acids (FISH OIL) 1000 MG CAPS Take 1 capsule by mouth daily.     omeprazole  (PRILOSEC) 40 MG capsule Omeprazole  40 mg by mouth twice a day for 4 weeks ,then back to 40 mg once a day if that controls reflux symptoms 90 capsule 3   ondansetron  (ZOFRAN -ODT) 4 MG disintegrating tablet Take 1 tablet (4 mg total) by mouth every 8 (eight) hours as needed for nausea or vomiting. 20 tablet 0   oxymetazoline  (AFRIN NASAL SPRAY) 0.05 % nasal spray Place 1 spray into both nostrils 2 (two) times daily. 15 mL 0   predniSONE  (DELTASONE ) 50 MG  tablet Take 1 tablet by mouth 13 hours prior to CT. Take 1 tablet by mouth 7 hours prior to CT. Take 1 tablet by mouth 1 hour prior to CT. (Patient not taking: Reported on 09/16/2023) 3 tablet 0   sucralfate  (CARAFATE ) 1 g tablet Take 1 tablet (1 g total) by mouth 4 (four) times daily -  with meals and at bedtime. 120 tablet 0   traMADol  (ULTRAM ) 50 MG tablet Take 1 tablet (50 mg total) by mouth every 6 (six) hours as needed. 15 tablet 0   No current facility-administered medications for this visit.    ROS: See above  Objective:  Psychiatric Specialty Exam: There were no vitals taken for this visit.There is no height or weight on file to calculate BMI.  General Appearance: Casual and Well Groomed  Eye Contact:  Good  Speech:  Clear and Coherent and Normal Rate  Volume:  Normal  Mood:  numb  Affect:  Mostly euthymic and more bright this visit; frustrated when describing court proceedings and police involvement  Thought Content: Denies AVH; IOR; paranoia   Suicidal Thoughts:  Denies SI  Homicidal Thoughts:  No  Thought Process:  Goal Directed and Linear; often overly inclusive  Orientation:  Full (Time, Place, and Person)    Memory:  Grossly intact  Judgment:  Good  Insight:  Fair  Concentration:  Concentration: Fair  Recall:  NA  Fund of Knowledge: Good  Language: Good  Psychomotor Activity:  Normal  Akathisia:  No  AIMS (if indicated): not done  Assets:  Communication Skills Desire for Improvement Housing Leisure Time Resilience Social Support Curator  ADL's:  Intact  Cognition: WNL  Sleep:  Good   PE: General: sits comfortably in view of camera; no acute distress  Pulm: no increased work of breathing on room air  MSK: all extremity movements appear intact  Neuro: no focal neurological deficits observed  Gait & Station: unable to assess by video    Metabolic Disorder Labs: Lab Results  Component Value Date   HGBA1C 5.6 12/11/2021   No  results found for: PROLACTIN Lab Results  Component Value Date   CHOL 240 (H) 07/28/2023   TRIG 138 07/28/2023   HDL 55 07/28/2023   CHOLHDL 4.4 07/28/2023   VLDL 11 09/14/2021   LDLCALC 160 (H) 07/28/2023   LDLCALC 89 07/04/2022   No results found for: TSH  Therapeutic Level Labs: No results found for: LITHIUM No results found for: VALPROATE No results found for: CBMZ  Screenings:  GAD-7    Flowsheet Row Office Visit from 12/11/2021 in [facility] Video Visit from 10/02/2021 in [facility] Office Visit from 06/27/2021 in [facility] Office Visit from 04/19/2021 in [facility] Office Visit from 01/11/2021 in [facility]  Total GAD-7 Score 10 9 8 6  0   PHQ2-9    Flowsheet Row Counselor from 08/28/2023 in [facility] Office Visit from 12/11/2021 in [facility] Video Visit from 10/02/2021 in [facility] Office Visit from 06/27/2021 in [facility] Office Visit from 04/19/2021 in [facility]  PHQ-2 Total Score 6 4 1 1 1   PHQ-9 Total Score 16 6 -- -- --   Loss adjuster, chartered Counselor from 08/28/2023 in [facility] ED from 08/13/2023 in [facility] Emergency Department at [facility] ED from 08/07/2023 in [facility] Emergency Department at [facility]  C-SSRS RISK CATEGORY Moderate Risk No Risk No Risk    Collaboration of Care: Collaboration of Care: Medication Management AEB ongoing medication management, Psychiatrist AEB established with this provider, and Referral or follow-up with counselor/therapist AEB established with individual psychotherapy  Patient/Guardian was advised Release of Information must be obtained prior to any record release in order to collaborate  their care with an outside provider. Patient/Guardian was advised if they have not already done so to contact the registration department to sign all necessary forms in order for us  to release information regarding their care.   Consent: Patient/Guardian gives verbal consent for treatment and assignment of benefits for services provided during this visit. Patient/Guardian expressed understanding and agreed to proceed.   Televisit via video: I connected with patient on 10/16/23 at  3:00 PM EDT by a video enabled telemedicine application and verified that I am speaking with the correct person using two identifiers.  Location: Patient: home address in [place] Provider: remote office in [place]   I discussed the limitations of evaluation and management by telemedicine and the availability of in person appointments. The patient expressed understanding and agreed to proceed.  I discussed the assessment and treatment plan with the patient. The patient was provided an opportunity to ask questions and all were answered. The patient agreed with the plan and demonstrated an understanding of the instructions.   The patient was advised to call back or seek an in-person evaluation if the symptoms worsen or if the condition fails to improve as anticipated.  I provided 20 minutes dedicated to the  care of this patient via video on the date of this encounter to include chart review, face-to-face time with the patient, medication management/counseling.  Psychotherapy was utilized during today's session from 3:10-3:30PM. Therapeutic interventions included empathic listening, supportive therapy, cognitive therapy. Used supportive interviewing techniques to provide emotional validation. Worked on cognitive reframing techniques and strengths awareness.  Improvement was evidenced by patient's participation and identified commitment to therapy goals.   Geran Haithcock A Cyana Shook 10/16/2023, 3:37 PM

## 2023-10-15 ENCOUNTER — Ambulatory Visit (INDEPENDENT_AMBULATORY_CARE_PROVIDER_SITE_OTHER): Admitting: Primary Care

## 2023-10-16 ENCOUNTER — Encounter (HOSPITAL_COMMUNITY): Payer: Self-pay | Admitting: Psychiatry

## 2023-10-16 ENCOUNTER — Telehealth: Payer: Self-pay | Admitting: Cardiology

## 2023-10-16 ENCOUNTER — Telehealth (HOSPITAL_COMMUNITY): Admitting: Psychiatry

## 2023-10-16 DIAGNOSIS — F410 Panic disorder [episodic paroxysmal anxiety] without agoraphobia: Secondary | ICD-10-CM | POA: Diagnosis not present

## 2023-10-16 DIAGNOSIS — I10 Essential (primary) hypertension: Secondary | ICD-10-CM

## 2023-10-16 DIAGNOSIS — F411 Generalized anxiety disorder: Secondary | ICD-10-CM

## 2023-10-16 MED ORDER — CLONAZEPAM 0.5 MG PO TABS: 0.5000 mg | ORAL_TABLET | Freq: Three times a day (TID) | ORAL | 2 refills | Status: AC

## 2023-10-16 MED ORDER — MIRTAZAPINE 30 MG PO TABS: 30.0000 mg | ORAL_TABLET | Freq: Every evening | ORAL | 2 refills | Status: AC

## 2023-10-16 MED ORDER — HYDROCHLOROTHIAZIDE 12.5 MG PO CAPS
12.5000 mg | ORAL_CAPSULE | Freq: Every day | ORAL | 2 refills | Status: AC
Start: 2023-10-16 — End: ?

## 2023-10-16 MED ORDER — HYDRALAZINE HCL 25 MG PO TABS: 25.0000 mg | ORAL_TABLET | Freq: Three times a day (TID) | ORAL | 2 refills | Status: AC

## 2023-10-16 MED ORDER — LABETALOL HCL 100 MG PO TABS: 100.0000 mg | ORAL_TABLET | Freq: Two times a day (BID) | ORAL | 2 refills | Status: AC

## 2023-10-16 MED ORDER — LOSARTAN POTASSIUM 50 MG PO TABS: 50.0000 mg | ORAL_TABLET | Freq: Every day | ORAL | 2 refills | Status: AC

## 2023-10-16 NOTE — Telephone Encounter (Signed)
 Pt's medications were sent to pt's pharmacy as requested. Confirmation received.

## 2023-10-16 NOTE — Telephone Encounter (Signed)
*  STAT* If patient is at the pharmacy, call can be transferred to refill team.   1. Which medications need to be refilled? (please list name of each medication and dose if known) hydrALAZINE  (APRESOLINE ) 25 MG tablet   hydrochlorothiazide  (MICROZIDE ) 12.5 MG capsule (Expired)   labetalol  (NORMODYNE ) 100 MG tablet (Expired)   losartan  (COZAAR ) 50 MG tablet (Expired)   2. Which pharmacy/location (including street and city if local pharmacy) is medication to be sent to? [id number] - [place], [place] - [address]   3. Do they need a 30 day or 90 day supply? 90

## 2023-10-16 NOTE — Patient Instructions (Signed)

## 2023-10-17 ENCOUNTER — Ambulatory Visit (HOSPITAL_COMMUNITY): Admitting: Mental Health

## 2023-10-17 DIAGNOSIS — F410 Panic disorder [episodic paroxysmal anxiety] without agoraphobia: Secondary | ICD-10-CM | POA: Diagnosis not present

## 2023-10-17 DIAGNOSIS — F411 Generalized anxiety disorder: Secondary | ICD-10-CM

## 2023-10-17 NOTE — Progress Notes (Signed)
   THERAPIST PROGRESS NOTE Virtual Visit via Video Note  I connected with Erin Pollard on 10/17/23 at 10:00 AM EDT by a video enabled telemedicine application and verified that I am speaking with the correct person using two identifiers.  Location: Patient: Erin Pollard  Provider: remote office   I discussed the limitations of evaluation and management by telemedicine and the availability of in person appointments. The patient expressed understanding and agreed to proceed.  I discussed the assessment and treatment plan with the patient. The patient was provided an opportunity to ask questions and all were answered. The patient agreed with the plan and demonstrated an understanding of the instructions.   The patient was advised to call back or seek an in-person evaluation if the symptoms worsen or if the condition fails to improve as anticipated.  I provided 48 minutes of non-face-to-face time during this encounter.   Ty Bernice Savant, [facility]   Session Time: 10:02 am ( 48 minutes)  Participation Level: Active  Behavioral Response: CasualAlertEuthymic  Type of Therapy: Individual Therapy  Treatment Goals addressed:  STG: Erin Pollard will decreased presence of stressors in daily life AEB completion of needed tasks with engagement in self-care daily within the next 90 days   ProgressTowards Goals: Progressing  Interventions: Supportive  Summary: Erin Pollard is a 53 y.o. female who presents with dx of generalized anxiety disorder with panic.  Erin Pollard presents to session alert and oriented; mood and affect stable, bright. Speech clear and coherent at normal rate and tone. Shares to be currently residing in [place] with daughter at the time. Notes to have had to vacate her home due to eviction and shares wit therapist events of eviction. Shares frustration with interaction with [employer] however feelings of relief. Notes to have been able to receive her social security with plans to look  for income based housing in [place] and denies desire to live in [place]. Shares thoughts on living with daughter again and concerns but plant to focus on gaining her independent housing. Shares for mood and sxs to be stable and notes various plans for the day. Made aware of [facility] OP office only serving individuals in [facility] and if unable to hold residence in [place] would have to transfer OPT. Denies safety concerns.  Suicidal/Homicidal: Nowithout intent/plan  Therapist Response: Therapist engaged [name] in tele-therapy session. Assessed for current location and ability to hold confidential session. Assessed for current level of functioning sxs management and sxs and level of stressors. Assessed for sxs of depression and anxiety and ability to cope and process recent events provided safe space and support in processing events of eviction and difficulties experiences. Supported in processing feelings and ability to regroup and cope with changes. Educated on office serving [employer] residences only. Reviewed session and provided follow up.  Plan: Return again in  x5 weeks.  Diagnosis: Generalized anxiety disorder with panic attacks  Collaboration of Care: Other None  Patient/Guardian was advised Release of Information must be obtained prior to any record release in order to collaborate their care with an outside provider. Patient/Guardian was advised if they have not already done so to contact the registration department to sign all necessary forms in order for us  to release information regarding their care.   Consent: Patient/Guardian gives verbal consent for treatment and assignment of benefits for services provided during this visit. Patient/Guardian expressed understanding and agreed to proceed.   Ty Bernice Wenona, [facility] 10/17/2023

## 2023-11-03 ENCOUNTER — Telehealth: Payer: Self-pay

## 2023-11-03 NOTE — Telephone Encounter (Signed)
 Copied from CRM [id number]. Topic: Clinical - Medication Question >> Nov 03, 2023  5:28 PM Delon HERO wrote: Reason for CRM: Patient is calling to ask if [facility] is ok to take with all of medications and BP meds, Medication Evolocumab  (REPATHA  SURECLICK) 140 MG/ML SOAJ [[id number]], clonazePAM  (KLONOPIN ) 0.5 MG tablet [[id number]]

## 2023-11-03 NOTE — Telephone Encounter (Signed)
 Will forward to provider

## 2023-11-06 ENCOUNTER — Ambulatory Visit (INDEPENDENT_AMBULATORY_CARE_PROVIDER_SITE_OTHER): Admitting: Neurology

## 2023-11-06 DIAGNOSIS — R202 Paresthesia of skin: Secondary | ICD-10-CM

## 2023-11-06 DIAGNOSIS — M5412 Radiculopathy, cervical region: Secondary | ICD-10-CM

## 2023-11-06 NOTE — Procedures (Signed)
 [facility]  [address] [place], [address]  [place], [place] [address] Tel: [phone] Fax: [phone] Test Date:  11/06/2023  Patient: Erin Pollard DOB: [birth year] Physician: Tonita Blanch, DO  Sex: Female Height: 5' 0 Ref Phys: Tonita Blanch, DO  ID#: [id number]   Technician:    History: This is a 53 year old female referred for evaluation of generalized pain and paresthesias.  NCV & EMG Findings: Extensive electrodiagnostic testing of the right upper and lower extremity shows:  Right median, ulnar, sural, and superficial peroneal sensory responses are within normal limits.  Right mixed palmar sensory responses show prolonged latency. Right median, ulnar, peroneal, tibial motor responses are within normal limits.  Of note, median motor nerve fibers are carried by the ulnar nerve, consistent with a Riche-Cannieu anastomosis, a normal anatomic variant.   Right tibial H reflex studies within normal limits.   Chronic motor axon loss changes are seen affecting the right C5-C6 myotome, without accompanying active denervation.    Impression: Chronic C5-C6 radiculopathy affecting the right upper extremity, mild.   Right median neuropathy at or distal to the wrist, consistent with a clinical diagnosis of carpal tunnel syndrome.  Overall, these findings are very mild in degree electrically. There is no evidence of a large fiber sensorimotor polyneuropathy or lumbosacral radiculopathy affecting the right side.   ___________________________ Tonita Blanch, DO    Nerve Conduction Studies   Stim Site NR Peak (ms) Norm Peak (ms) O-P Amp (V) Norm O-P Amp  Right Median Anti Sensory (2nd Digit)  32 C  Wrist    3.2 <3.6 56.2 >15  Right Sup Peroneal Anti Sensory (Ant Lat Mall)  32 C  12 cm    1.7 <4.6 12.2 >4  Right Sural Anti Sensory (Lat Mall)  32 C  Calf    2.3 <4.6 13.9 >4  Right Ulnar Anti Sensory (5th Digit)  32 C  Wrist    2.5 <3.1 47.8 >10     Stim Site NR Onset  (ms) Norm Onset (ms) O-P Amp (mV) Norm O-P Amp Site1 Site2 Delta-0 (ms) Dist (cm) Vel (m/s) Norm Vel (m/s)  Right Median Motor (Abd Poll Brev)  32 C  Ulnar-wrist    3.7 <4.0 11.9 >6 Ulnar-Below elbow Ulnar-wrist 3.7 23.0 62 >50  Ulnar-Below elbow    7.4  11.3  Median-wrist Ulnar-Below elbow  0.0    Median-wrist *NR            Right Peroneal Motor (Ext Dig Brev)  32 C  Ankle    3.0 <6.0 5.4 >2.5 B Fib Ankle 5.8 34.0 59 >40  B Fib    8.8  5.3  Poplt B Fib 1.5 8.0 53 >40  Poplt    10.3  5.2         Right Tibial Motor (Abd Hall Brev)  32 C  Ankle    2.6 <6.0 7.6 >4 Knee Ankle 6.5 38.0 58 >40  Knee    9.1  5.6         Right Ulnar Motor (Abd Dig Minimi)  32 C  Wrist    2.1 <3.1 14.4 >7 B Elbow Wrist 3.3 22.0 67 >50  B Elbow    5.4  14.1  A Elbow B Elbow 1.2 10.0 83 >50  A Elbow    6.6  13.5            Stim Site NR Peak (ms) Norm Peak (ms) P-T Amp (V) Site1 Site2 Delta-P (ms) Norm Delta (ms)  Right Median/Ulnar Palm Comparison (Wrist - 8cm)  32 C  Median Palm    1.9 <2.2 23.1 Median Palm Ulnar Palm *0.5   Ulnar Palm    1.4 <2.2 13.3       Electromyography   Side Muscle Ins.Act Fibs Fasc Recrt Amp Dur Poly Activation Comment  Right 1stDorInt Nml Nml Nml Nml Nml Nml Nml Nml N/A  Right Abd Poll Brev Nml Nml Nml Nml Nml Nml Nml Nml N/A  Right PronatorTeres Nml Nml Nml Nml Nml Nml Nml Nml N/A  Right Biceps Nml Nml Nml *1- *1+ *1+ *1+ Nml N/A  Right Triceps Nml Nml Nml Nml Nml Nml Nml Nml N/A  Right Deltoid Nml Nml Nml *1- *1+ *1+ *1+ Nml N/A  Right AntTibialis Nml Nml Nml Nml Nml Nml Nml Nml N/A  Right Gastroc Nml Nml Nml Nml Nml Nml Nml Nml N/A  Right Flex Dig Long Nml Nml Nml Nml Nml Nml Nml Nml N/A  Right RectFemoris Nml Nml Nml Nml Nml Nml Nml Nml N/A  Right GluteusMed Nml Nml Nml Nml Nml Nml Nml Nml N/A      Waveforms:

## 2023-11-07 MED ORDER — WRIST BRACE MISC: Status: DC

## 2023-11-10 ENCOUNTER — Ambulatory Visit (INDEPENDENT_AMBULATORY_CARE_PROVIDER_SITE_OTHER): Admitting: Primary Care

## 2023-11-10 ENCOUNTER — Telehealth: Payer: Self-pay | Admitting: Neurology

## 2023-11-10 DIAGNOSIS — R202 Paresthesia of skin: Secondary | ICD-10-CM

## 2023-11-10 DIAGNOSIS — M5412 Radiculopathy, cervical region: Secondary | ICD-10-CM

## 2023-11-10 DIAGNOSIS — M47812 Spondylosis without myelopathy or radiculopathy, cervical region: Secondary | ICD-10-CM

## 2023-11-10 NOTE — Telephone Encounter (Signed)
 Referrals created and faxed as requested. Notified patient via Mychart.

## 2023-11-10 NOTE — Telephone Encounter (Signed)
 Pt called in today and she stated that Erin Pollard stated that they can do the Physical therapy and the Pain Management, but they need two scripts . Their fax number is [phone]. Thanks

## 2023-11-14 ENCOUNTER — Telehealth: Payer: Self-pay | Admitting: Neurology

## 2023-11-14 DIAGNOSIS — R202 Paresthesia of skin: Secondary | ICD-10-CM | POA: Diagnosis not present

## 2023-11-14 DIAGNOSIS — R292 Abnormal reflex: Secondary | ICD-10-CM | POA: Diagnosis not present

## 2023-11-14 NOTE — Telephone Encounter (Signed)
 [facility]- Needs notes sent for right cockup wrist brace Adrs:[address] [place] [address] Eyw#[phone] option [phone] Fax#[phone]  Office not open on the weekends

## 2023-11-14 NOTE — Telephone Encounter (Signed)
 I have faxed over notes as requested.

## 2023-11-17 ENCOUNTER — Telehealth (INDEPENDENT_AMBULATORY_CARE_PROVIDER_SITE_OTHER): Payer: Self-pay | Admitting: Primary Care

## 2023-11-17 NOTE — Telephone Encounter (Signed)
 Called pt to confirm appt. Pt will be present.

## 2023-11-18 ENCOUNTER — Encounter (INDEPENDENT_AMBULATORY_CARE_PROVIDER_SITE_OTHER): Payer: Self-pay

## 2023-11-18 ENCOUNTER — Ambulatory Visit (INDEPENDENT_AMBULATORY_CARE_PROVIDER_SITE_OTHER): Admitting: Primary Care

## 2023-11-20 DIAGNOSIS — G894 Chronic pain syndrome: Secondary | ICD-10-CM | POA: Diagnosis not present

## 2023-11-20 DIAGNOSIS — M542 Cervicalgia: Secondary | ICD-10-CM | POA: Diagnosis not present

## 2023-11-20 DIAGNOSIS — M5412 Radiculopathy, cervical region: Secondary | ICD-10-CM | POA: Diagnosis not present

## 2023-11-25 DIAGNOSIS — M542 Cervicalgia: Secondary | ICD-10-CM | POA: Diagnosis not present

## 2023-11-28 ENCOUNTER — Encounter (HOSPITAL_COMMUNITY): Payer: Self-pay

## 2023-11-28 ENCOUNTER — Telehealth (HOSPITAL_COMMUNITY): Payer: Self-pay | Admitting: Mental Health

## 2023-11-28 ENCOUNTER — Ambulatory Visit (HOSPITAL_COMMUNITY): Admitting: Mental Health

## 2023-11-28 NOTE — Telephone Encounter (Signed)
 Therapist sent link for tele-therapy session x 2 no response. Therapist informed pt contacted front desk and reported would connect to tele-therapy session. Therapist waited an additonal x 10 minutes till 10:20am (10am appointment) with pt failing to connect. NS

## 2023-11-28 NOTE — Telephone Encounter (Signed)
 Pt contacted front desk and requested therapist contact her. Therapist contacted pt (using blocked number due to remote work) with number going straight to voicemail twice. Left message therapist would follow up again 11/11 upon returning to in office work.

## 2023-12-02 ENCOUNTER — Telehealth (HOSPITAL_COMMUNITY): Payer: Self-pay | Admitting: Mental Health

## 2023-12-02 NOTE — Telephone Encounter (Signed)
 Call pt to remind about f/u lipid lab - pt will go for lab on Nov 12.

## 2023-12-02 NOTE — Telephone Encounter (Signed)
 Therapist contacted pt to follow up on request for return call from 11/28/2023 missed appointment. Pt stated to have been in [place] looking for apartments and thus was not able to hold appointment. Shares is actively working to return back to [place]. Rescheduled for 1/5 @ 3pm virtual appointment. Also reported to be in need of follow up appointment for medication management with provider leaving. Therapist educated patient conservation officer, nature.

## 2023-12-04 ENCOUNTER — Ambulatory Visit (INDEPENDENT_AMBULATORY_CARE_PROVIDER_SITE_OTHER): Admitting: Primary Care

## 2023-12-04 DIAGNOSIS — M542 Cervicalgia: Secondary | ICD-10-CM | POA: Diagnosis not present

## 2023-12-05 DIAGNOSIS — E78019 Familial hypercholesterolemia, unspecified: Secondary | ICD-10-CM | POA: Diagnosis not present

## 2023-12-06 LAB — LIPID PANEL
Chol/HDL Ratio: 2.4 ratio (ref 0.0–4.4)
Cholesterol, Total: 128 mg/dL (ref 100–199)
HDL: 54 mg/dL (ref >39)
LDL Chol Calc (NIH): 61 mg/dL (ref 0–99)
Triglycerides: 63 mg/dL (ref 0–149)
VLDL Cholesterol Cal: 13 mg/dL (ref 5–40)

## 2023-12-06 LAB — LIPOPROTEIN A (LPA): Lipoprotein (a): 650 nmol/L — ABNORMAL HIGH (ref <75.0)

## 2023-12-09 MED ORDER — EZETIMIBE 10 MG PO TABS: 10.0000 mg | ORAL_TABLET | Freq: Every day | ORAL | 3 refills | Status: AC

## 2023-12-09 NOTE — Telephone Encounter (Signed)
 Lipid lab discussed over the phone - LDLc went down to 61 from 160 while on Repatha  and Lipitor 40 mg daily. Lpa did not changed much on Repatha . Will add Zetia to lower LDLc below 55. Patient agree with the plan. Prescription sent and follow up lab (lipid and LFT) due Feb 19,2026

## 2023-12-09 NOTE — Addendum Note (Signed)
 Addended by: Makailee Nudelman K on: 12/09/2023 09:39 AM   Modules accepted: Orders

## 2023-12-12 DIAGNOSIS — M542 Cervicalgia: Secondary | ICD-10-CM | POA: Diagnosis not present

## 2023-12-17 ENCOUNTER — Ambulatory Visit (INDEPENDENT_AMBULATORY_CARE_PROVIDER_SITE_OTHER): Admitting: Primary Care

## 2023-12-17 ENCOUNTER — Encounter (INDEPENDENT_AMBULATORY_CARE_PROVIDER_SITE_OTHER): Payer: Self-pay | Admitting: Primary Care

## 2023-12-17 VITALS — BP 155/96 | HR 75 | Resp 16 | Wt 169.6 lb

## 2023-12-17 DIAGNOSIS — R202 Paresthesia of skin: Secondary | ICD-10-CM

## 2023-12-17 DIAGNOSIS — M5412 Radiculopathy, cervical region: Secondary | ICD-10-CM | POA: Diagnosis not present

## 2023-12-17 NOTE — Progress Notes (Signed)
 [facility]  Erin Pollard, is a 53 y.o. female  RDW:[id number]  FMW:[id number]  DOB - 07-06-1970  Chief Complaint  Patient presents with   Foot Pain    Right foot feels like a tooth ache then starts to cramp        Subjective:   Erin Pollard is a 53 y.o. female here today for an acute visit.Patient is c/o right foot pain described as tooth aches and muscle spasms. She is followed by cardiology and on diuretics and statin. Last CMP potassium was low and  . prescribed supplements. Dx cervical radiculopathy with paresthesia.   No problems updated.  Comprehensive ROS Pertinent positive and negative noted in HPI   Allergies  Allergen Reactions   Amlodipine Hives    Achy legs and s.o.b Achy legs and s.o.b    Hydrocodone-Acetaminophen  Hives and Rash   Iodinated Contrast Media Hives   Iodine Hives and Rash   Codeine    Hydrocortisone-Iodoquinol    Propoxyphene     Past Medical History:  Diagnosis Date   Anxiety    Cervical radiculopathy    GERD (gastroesophageal reflux disease)    Hiatal hernia 08/28/2022   [facility]  was found through a CT scan and fibroids and cyst on liver parenchyma   Hypertension    Myocardial infarction (HCC)    Panic attack     Current Outpatient Medications on File Prior to Visit  Medication Sig Dispense Refill   atorvastatin  (LIPITOR) 40 MG tablet Take 1 tablet (40 mg total) by mouth daily. 90 tablet 3   cholecalciferol (VITAMIN D3) 25 MCG (1000 UNIT) tablet Take 1,000 Units by mouth daily.     clonazePAM  (KLONOPIN ) 0.5 MG tablet Take 1 tablet (0.5 mg total) by mouth 3 (three) times daily. 90 tablet 2   dicyclomine  (BENTYL ) 10 MG capsule Take 1 capsule (10 mg total) by mouth every 6 (six) hours as needed for spasms (cramps). 70 capsule 1   diphenhydrAMINE  (BENADRYL  ALLERGY) 25 MG tablet Take 2 tablets (50 mg total) by mouth as directed. 1 hour prior to your CT. 30 tablet 0   Eluxadoline  (VIBERZI ) 100 MG  TABS Take 1 tablet (100 mg total) by mouth 2 (two) times daily before a meal. 60 tablet 3   Evolocumab  (REPATHA  SURECLICK) 140 MG/ML SOAJ Inject 140 mg into the skin every 14 (fourteen) days. 6 mL 3   ezetimibe  (ZETIA ) 10 MG tablet Take 1 tablet (10 mg total) by mouth daily. 90 tablet 3   famotidine  (PEPCID ) 20 MG tablet Take 20 mg by mouth as needed.     ferrous sulfate  325 (65 FE) MG EC tablet TAKE 1 TABLET BY MOUTH EVERY DAY 90 tablet 0   hydrALAZINE  (APRESOLINE ) 25 MG tablet Take 1 tablet (25 mg total) by mouth 3 (three) times daily. 270 tablet 2   hydrochlorothiazide  (MICROZIDE ) 12.5 MG capsule Take 1 capsule (12.5 mg total) by mouth daily. 90 capsule 2   KLOR-CON  M10 10 MEQ tablet Take 1 tablet (10 mEq total) by mouth daily. 90 tablet 1   labetalol  (NORMODYNE ) 100 MG tablet Take 1 tablet (100 mg total) by mouth 2 (two) times daily. 180 tablet 2   lipase/protease/amylase (CREON ) 36000 UNITS CPEP capsule Take 2 capsules (72,000 Units total) by mouth 3 (three) times daily with meals. May also take 1 capsule (36,000 Units total) as needed (with snacks - up to 2 snacks daily). 300 capsule 1   loratadine  (CLARITIN ) 10 MG tablet  Take 1 tablet (10 mg total) by mouth daily. 30 tablet 6   losartan  (COZAAR ) 50 MG tablet Take 1 tablet (50 mg total) by mouth daily. 90 tablet 2   mirtazapine  (REMERON ) 30 MG tablet Take 1 tablet (30 mg total) by mouth at bedtime. 30 tablet 2   Misc. Devices (WRIST BRACE) MISC Right cockup wrist brace for carpal tunnel syndrome. 1 each EACH   naproxen  (NAPROSYN ) 500 MG tablet Take 1 tablet (500 mg total) by mouth 2 (two) times daily. (Patient not taking: Reported on 09/16/2023) 90 tablet 1   Omega-3 Fatty Acids (FISH OIL) 1000 MG CAPS Take 1 capsule by mouth daily.     omeprazole  (PRILOSEC) 40 MG capsule Omeprazole  40 mg by mouth twice a day for 4 weeks ,then back to 40 mg once a day if that controls reflux symptoms 90 capsule 3   ondansetron  (ZOFRAN -ODT) 4 MG disintegrating  tablet Take 1 tablet (4 mg total) by mouth every 8 (eight) hours as needed for nausea or vomiting. 20 tablet 0   oxymetazoline  (AFRIN NASAL SPRAY) 0.05 % nasal spray Place 1 spray into both nostrils 2 (two) times daily. 15 mL 0   predniSONE  (DELTASONE ) 50 MG tablet Take 1 tablet by mouth 13 hours prior to CT. Take 1 tablet by mouth 7 hours prior to CT. Take 1 tablet by mouth 1 hour prior to CT. (Patient not taking: Reported on 09/16/2023) 3 tablet 0   sucralfate  (CARAFATE ) 1 g tablet Take 1 tablet (1 g total) by mouth 4 (four) times daily -  with meals and at bedtime. 120 tablet 0   traMADol  (ULTRAM ) 50 MG tablet Take 1 tablet (50 mg total) by mouth every 6 (six) hours as needed. 15 tablet 0   No current facility-administered medications on file prior to visit.   Health Maintenance  Topic Date Due   Hepatitis B Vaccine (1 of 3 - 19+ 3-dose series) Never done   Pneumococcal Vaccine for age over 34 (1 of 1 - PCV) Never done   Zoster (Shingles) Vaccine (1 of 2) Never done   Flu Shot  Never done   COVID-19 Vaccine (1 - 2025-26 season) Never done   Pap with HPV screening  03/15/2024   Breast Cancer Screening  10/09/2024   Colon Cancer Screening  12/23/2027   DTaP/Tdap/Td vaccine (2 - Td or Tdap) 04/12/2029   Hepatitis C Screening  Completed   HIV Screening  Completed   HPV Vaccine  Aged Out   Meningitis B Vaccine  Aged Out    Objective:   Vitals:   12/17/23 1423  BP: (!) 155/96  Pulse: 75  Resp: 16  SpO2: 96%  Weight: 169 lb 9.6 oz (76.9 kg)   BP Readings from Last 3 Encounters:  12/17/23 (!) 155/96  09/16/23 (!) 144/92  08/14/23 (!) 111/53     Physical Exam Vitals reviewed.  Constitutional:      Appearance: Normal appearance. She is obese.  HENT:     Head: Normocephalic.     Right Ear: Tympanic membrane, ear canal and external ear normal.     Left Ear: Tympanic membrane, ear canal and external ear normal.     Nose: Nose normal.     Mouth/Throat:     Mouth: Mucous  membranes are moist.  Eyes:     Extraocular Movements: Extraocular movements intact.     Pupils: Pupils are equal, round, and reactive to light.  Cardiovascular:     Rate and Rhythm:  Normal rate and regular rhythm.  Pulmonary:     Effort: Pulmonary effort is normal.     Breath sounds: Normal breath sounds.  Abdominal:     General: Bowel sounds are normal.     Palpations: Abdomen is soft.  Musculoskeletal:        General: Normal range of motion.     Cervical back: Normal range of motion and neck supple.  Skin:    General: Skin is warm and dry.  Neurological:     Mental Status: She is alert and oriented to person, place, and time.  Psychiatric:        Mood and Affect: Mood normal.        Behavior: Behavior normal.        Thought Content: Thought content normal.    Assessment & Plan  Teren was seen today for foot pain.  Diagnoses and all orders for this visit:  Cervical radiculopathy 2/2 Paresthesia right foot pain described as tooth aches and muscle spasms. impression: Chronic C5-C6 radiculopathy affecting the right upper extremity, mild.   Right median neuropathy at or distal to the wrist, consistent with a clinical diagnosis of carpal tunnel syndrome.  Overall, these findings are very mild in degree electrically. There is no evidence of a large fiber sensorimotor polyneuropathy or lumbosacral radiculopathy affecting the right side.     Patient have been counseled extensively about nutrition and exercise. Other issues discussed during this visit include: low cholesterol diet, weight control and daily exercise, foot care, annual eye examinations at Ophthalmology, importance of adherence with medications and regular follow-up. We also discussed long term complications of uncontrolled diabetes and hypertension.   As needed

## 2023-12-23 ENCOUNTER — Other Ambulatory Visit (INDEPENDENT_AMBULATORY_CARE_PROVIDER_SITE_OTHER): Payer: Self-pay

## 2023-12-23 DIAGNOSIS — R252 Cramp and spasm: Secondary | ICD-10-CM | POA: Diagnosis not present

## 2023-12-23 DIAGNOSIS — R42 Dizziness and giddiness: Secondary | ICD-10-CM | POA: Diagnosis not present

## 2023-12-23 DIAGNOSIS — I10 Essential (primary) hypertension: Secondary | ICD-10-CM

## 2023-12-23 DIAGNOSIS — R0789 Other chest pain: Secondary | ICD-10-CM | POA: Diagnosis not present

## 2023-12-23 DIAGNOSIS — E559 Vitamin D deficiency, unspecified: Secondary | ICD-10-CM

## 2023-12-23 DIAGNOSIS — R531 Weakness: Secondary | ICD-10-CM | POA: Diagnosis not present

## 2023-12-23 DIAGNOSIS — E876 Hypokalemia: Secondary | ICD-10-CM

## 2023-12-23 DIAGNOSIS — R1013 Epigastric pain: Secondary | ICD-10-CM | POA: Diagnosis not present

## 2023-12-25 DIAGNOSIS — M542 Cervicalgia: Secondary | ICD-10-CM | POA: Diagnosis not present

## 2023-12-26 ENCOUNTER — Telehealth (HOSPITAL_COMMUNITY): Payer: Self-pay

## 2023-12-26 ENCOUNTER — Encounter (HOSPITAL_COMMUNITY): Payer: Self-pay

## 2023-12-26 NOTE — Telephone Encounter (Signed)
 Medication management - Call with patient, after she discussed with Zane Luna, Patient Access and Lytle Bolster, [place] her need for a new refill of medications. Patient aware Dr. Mercy is no longer a provider at the [facility] and discussed her current living situation with her daughter in [place], [place].  Patient reported this was temporary and that she was going to be returning back to [facility] as soon as she located a new place to stay in [place]. Patient appeared quite upset, speaking loudly and was not happy NP informed her he could not refill her Clonazepam  this date.  Informed patient per medical review, Dr. Mercy has send in a new order for her Clonazepam  order to start on 10/29/23 plus 2 refills, #90 for three times a day for each.  Informed patient she should still have medications from that order to last until 01/28/24 as patient called [facility] pharmacy there in [place] to verify they had filled these on 10/27/23, 11/25/23 and 12/24/23.  Informed patient they had filled her orders for 10/29/23 1-2 days early each time but that patient should still have enough to last until 01/28/24.  Patient stated understanding this and informed she would need to see a new provider prior to needing any new orders for them to continue any controlled substances for patient.  Informed patient our patient access staff would work on reassigning her to a new provider at the [facility] outpatient to see her prior to 01/28/24 and would call her back at [phone] with that appointment.  Also, informed patient in the future if she does not end up moving back to [facility], we would at some point have to require her to find a new provider in that area.  Patient stated understanding about prescription refill requests today and of all instructions.

## 2023-12-27 ENCOUNTER — Other Ambulatory Visit: Payer: Self-pay | Admitting: Gastroenterology

## 2023-12-29 ENCOUNTER — Telehealth: Payer: Self-pay | Admitting: Gastroenterology

## 2023-12-29 NOTE — Telephone Encounter (Signed)
 Patient is aware that Creon  was sent to [facility] in [place], [place].  She stated there was some issue at the pharmacy and they were only able to give her a 100 day supply.  Patient stated, EMS just left my house. My BP is elevated and I think I have what I need.  If I need further refills, I will call back at a later time.

## 2023-12-29 NOTE — Telephone Encounter (Addendum)
 Spoke w pt about medication Creon . Pt states she was given emergency refill but pharmacy has denied current refill and refill date has expired. Pt requesting a call back for refill instructions. Please advise.

## 2024-01-06 DIAGNOSIS — G894 Chronic pain syndrome: Secondary | ICD-10-CM | POA: Diagnosis not present

## 2024-01-06 DIAGNOSIS — M542 Cervicalgia: Secondary | ICD-10-CM | POA: Diagnosis not present

## 2024-01-06 DIAGNOSIS — M5412 Radiculopathy, cervical region: Secondary | ICD-10-CM | POA: Diagnosis not present

## 2024-01-08 ENCOUNTER — Telehealth (INDEPENDENT_AMBULATORY_CARE_PROVIDER_SITE_OTHER): Payer: Self-pay | Admitting: Primary Care

## 2024-01-08 NOTE — Telephone Encounter (Signed)
 Copied from CRM #[id number]. Topic: General - Other >> Jan 08, 2024  9:10 AM Viola F wrote: Reason for CRM: Patient requested call back from [facility] asap - she is currently at physical therapy office and needs to speak with her regarding paperwork. Please call  [phone]

## 2024-01-09 NOTE — Telephone Encounter (Signed)
 Pt called this morning and I spoke with pt. Were able to locate records and have printed them out for provider to review   Erin Pollard pt states you are suppose to be writing a letter for her. Please address

## 2024-01-12 ENCOUNTER — Telehealth (INDEPENDENT_AMBULATORY_CARE_PROVIDER_SITE_OTHER): Payer: Self-pay | Admitting: Primary Care

## 2024-01-12 NOTE — Telephone Encounter (Unsigned)
 Copied from CRM [id number]. Topic: General - Other >> Jan 12, 2024 12:21 PM Shanda MATSU wrote: Reason for CRM: patient called in req to speak with Gordy A', called CAL but was adv she is in with patient, patient calling in frustrated that provider is stating that old records have not been reviewed when she was adv that they have, is req a call back.

## 2024-01-12 NOTE — Telephone Encounter (Signed)
 Called patient voicemail full. Call back [phone]

## 2024-01-19 ENCOUNTER — Telehealth (INDEPENDENT_AMBULATORY_CARE_PROVIDER_SITE_OTHER): Payer: Self-pay | Admitting: Primary Care

## 2024-01-19 NOTE — Telephone Encounter (Signed)
 Will forward to provider

## 2024-01-19 NOTE — Telephone Encounter (Signed)
 Pt called in stating she had a missed call from provider and was returning her call. Please advise.

## 2024-01-19 NOTE — Telephone Encounter (Signed)
 Provider tried reaching out to pt same will forward to provider to see if this has been taken care of

## 2024-01-26 ENCOUNTER — Encounter (HOSPITAL_COMMUNITY): Payer: Self-pay

## 2024-01-26 ENCOUNTER — Telehealth (HOSPITAL_COMMUNITY): Payer: Self-pay | Admitting: Mental Health

## 2024-01-26 ENCOUNTER — Ambulatory Visit (HOSPITAL_COMMUNITY): Admitting: Mental Health

## 2024-01-26 NOTE — Progress Notes (Unsigned)
 BH MD Outpatient Progress Note  01/26/2024 10:12 AM Erin Pollard  MRN:  [id number]  Assessment:  Erin Pollard [place] presents for follow-up evaluation. Today, 01/26/2024, patient reports recent stressors including eviction from her apartment with police involvement. She is now staying at her daughter's home and while this has provided relief in many ways, she identifies this as only temporary housing arrangement due to their own complicated history. She does share positive news of approval for SSI. She feels that mood has remained overall stable although reports persistent anxiety. She has self-decreased Remeron  back to 30 mg dosing due to concern for oversedation at 45 mg dose. Explored additional options for treatment of anxiety, in particular SNRI given comorbid pain. She reports past intolerability to Cymbalta in the past and in general notes sensitivity to side effects with new medications. She reports she will be getting established with pain management in the near-future and opts to continue medications as currently prescribed to see how she tolerates pain management options and how better pain control impacts anxiety. No changes to plan of care at this time.  RTC in approx. 2 months by video.  Patient was made aware of this provider's departure from [facility] at the end of Nov 2025 and that she will be transitioned to alternative provider in the clinic after this time. All questions/concerns addressed.  Identifying Information: Erin Pollard is a 54 y.o. female with a history of generalized anxiety disorder with panic attacks, HTN, past MI (2017), HLD, and IBS who is an established patient with [facility] participating in follow-up via video conferencing.   Plan:  # GAD with panic attacks Past medication trials: Zoloft, Valium (nausea), patient reports numerous others that requires further exploration Status of problem: chronic; persistent Interventions: --  Continue mirtazapine  30 mg at bedtime (i6/11/25)  -- Patient self-reduced from 45 to 30 mg nightly due to oversedation at higher dosing -- Continue Klonopin  0.5 mg TID PRN panic attacks (patient using TID)  -- Risks of long-term use have been extensively discussed and patient expressed understanding and desire to continue as she finds it effective with absence of side effects. It is felt that benefits outweigh risks at this time.  -- PDMP reviewed with appropriate filling -- Future considerations: would consider SNRI such as Effexor given comorbid pain (patient reports past side effects with Cymbalta) -- Continue Vitamin D  supplementation by PCP for Vitamin D  deficiency -- Continue individual psychotherapy with Bernice Rao, [facility] -- Patient previously referred to PHP/IOP for increased support and intake completed 08/28/23; unfortunately patient unable to participate at this time given time commitment  Patient was given contact information for behavioral health clinic and was instructed to call 911 for emergencies.   Subjective:  Chief Complaint:  No chief complaint on file.   Interval History:   Chart review: -- Completed initial intake for PHP/IOP 08/28/23   Patient reports she was evicted last Wednesday and now staying at [place] house. Shares court proceedings leading up to this and how sheriffs threw her out of the apartment in the early morning. Since being at [facility], states things have been fine but knows they have their own history and that this living situation will only be temporary. Thus far, things have been peaceful. Was approved for some SSI which provides some relief.   Describes mood as numb lately but denies low mood or easy tearfulness. Notes improvement in mood since getting out of old apartment. Has not worked since May 2025. Anxiety  is fairly constant and often worrying that something else bad will happen.   Feels that Remeron  45 mg was making her feel loopy so  returned to old script of 30 mg. Continuing to sleep well without oversedation at this dose.   Continues to deal with a lot of MSK and neuropathic pain. Will be having NCS.   Given persistence of anxiety, explored additional medication options. Reports she has tried Cymbalta but did not like the way this made her feel. She reports trouble tolerating numerous medications in the past and is fearful of side effects. She states she will be starting pain management soon and would prefer to wait to see what medications they recommend to see how pain management impacts her symptoms.   Reports she had attended intake for PHP/IOP but ultimately was not able to participate due to time commitment from court proceedings.  Given recent psychosocial changes and upcoming pain management appt, opts to continue current regimen as prescribed.   PDMP: -- Klonopin  0.5 mg QTY 90 last filled 06/02/23 (rx dating back to 2021)  Visit Diagnosis:  No diagnosis found.   Past Psychiatric History:  Diagnoses: GAD with panic attacks Medication trials: Zoloft, Cymbalta (out of it), Valium (nausea), patient reports numerous others that requires further exploration Hospitalizations: x1 in 1996 for suicide attempt Suicide attempts: x1 in 1997 via cutting Substance use: denies use of etoh, tobacco, or illicit drugs  -- Tobacco: quit smoking 2017  Past Medical History:  Past Medical History:  Diagnosis Date   Anxiety    Cervical radiculopathy    GERD (gastroesophageal reflux disease)    Hiatal hernia 08/28/2022   [facility]  was found through a CT scan and fibroids and cyst on liver parenchyma   Hypertension    Myocardial infarction (HCC)    Panic attack     Past Surgical History:  Procedure Laterality Date   COLONOSCOPY     LEFT HEART CATH     TUBAL LIGATION     UPPER GASTROINTESTINAL ENDOSCOPY      Family Psychiatric History:  Reports 2 of her sons have struggled with substance  use  Family History:  Family History  Problem Relation Age of Onset   Heart attack Mother    Cancer Father    Stroke Father    Stroke Sister    Heart disease Maternal Grandmother    Heart attack Maternal Grandmother    Heart disease Maternal Grandfather    Stroke Maternal Grandfather    Esophageal cancer Neg Hx    Colon cancer Neg Hx    Stomach cancer Neg Hx     Social History:  Social History   Socioeconomic History   Marital status: Divorced    Spouse name: Not on file   Number of children: 4   Years of education: Not on file   Highest education level: Associate degree: academic program  Occupational History   Not on file  Tobacco Use   Smoking status: Former    Current packs/day: 0.00    Types: Cigarettes    Quit date: 2017    Years since quitting: 9.0   Smokeless tobacco: Not on file  Vaping Use   Vaping status: Never Used  Substance and Sexual Activity   Alcohol use: Not Currently   Drug use: Never   Sexual activity: Yes  Other Topics Concern   Not on file  Social History Narrative   Are you right handed or left handed? Right Handed  Are you currently employed ? No    What is your current occupation? No.    Do you live at home alone?No    Who lives with you? Son    What type of home do you live in: 1 story or 2 story? Ranch style home. One step into home.        Social Drivers of Health   Tobacco Use: Medium Risk (12/17/2023)   Patient History    Smoking Tobacco Use: Former    Smokeless Tobacco Use: Unknown    Passive Exposure: Not on file  Financial Resource Strain: High Risk (07/25/2023)   Overall Financial Resource Strain (CARDIA)    Difficulty of Paying Living Expenses: Very hard  Food Insecurity: Food Insecurity Present (07/25/2023)   Epic    Worried About Programme Researcher, Broadcasting/film/video in the Last Year: Sometimes true    Ran Out of Food in the Last Year: Sometimes true  Transportation Needs: Unmet Transportation Needs (07/25/2023)   Epic    Lack of  Transportation (Medical): Yes    Lack of Transportation (Non-Medical): Yes  Physical Activity: Insufficiently Active (07/25/2023)   Exercise Vital Sign    Days of Exercise per Week: 3 days    Minutes of Exercise per Session: 30 min  Stress: Stress Concern Present (07/25/2023)   [employer] of Occupational Health - Occupational Stress Questionnaire    Feeling of Stress: Very much  Social Connections: Moderately Isolated (07/25/2023)   Social Connection and Isolation Panel    Frequency of Communication with Friends and Family: More than three times a week    Frequency of Social Gatherings with Friends and Family: Three times a week    Attends Religious Services: 1 to 4 times per year    Active Member of Clubs or Organizations: No    Attends Banker Meetings: Not on file    Marital Status: Divorced  Depression (PHQ2-9): High Risk (08/28/2023)   Depression (PHQ2-9)    PHQ-2 Score: 16  Alcohol Screen: Not on file  Housing: High Risk (07/25/2023)   Epic    Unable to Pay for Housing in the Last Year: Yes    Number of Times Moved in the Last Year: 0    Homeless in the Last Year: No  Utilities: Not on file  Health Literacy: Not on file    Allergies:  Allergies  Allergen Reactions   Amlodipine Hives    Achy legs and s.o.b Achy legs and s.o.b    Hydrocodone-Acetaminophen  Hives and Rash   Iodinated Contrast Media Hives   Iodine Hives and Rash   Codeine    Hydrocortisone-Iodoquinol    Propoxyphene     Current Medications: Current Outpatient Medications  Medication Sig Dispense Refill   atorvastatin  (LIPITOR) 40 MG tablet Take 1 tablet (40 mg total) by mouth daily. 90 tablet 3   cholecalciferol (VITAMIN D3) 25 MCG (1000 UNIT) tablet Take 1,000 Units by mouth daily.     clonazePAM  (KLONOPIN ) 0.5 MG tablet Take 1 tablet (0.5 mg total) by mouth 3 (three) times daily. 90 tablet 2   dicyclomine  (BENTYL ) 10 MG capsule Take 1 capsule (10 mg total) by mouth every 6 (six) hours  as needed for spasms (cramps). 70 capsule 1   diphenhydrAMINE  (BENADRYL  ALLERGY) 25 MG tablet Take 2 tablets (50 mg total) by mouth as directed. 1 hour prior to your CT. 30 tablet 0   Eluxadoline  (VIBERZI ) 100 MG TABS Take 1 tablet (100 mg total) by mouth 2 (  two) times daily before a meal. 60 tablet 3   Evolocumab  (REPATHA  SURECLICK) 140 MG/ML SOAJ Inject 140 mg into the skin every 14 (fourteen) days. 6 mL 3   ezetimibe  (ZETIA ) 10 MG tablet Take 1 tablet (10 mg total) by mouth daily. 90 tablet 3   famotidine  (PEPCID ) 20 MG tablet Take 20 mg by mouth as needed.     ferrous sulfate  325 (65 FE) MG EC tablet TAKE 1 TABLET BY MOUTH EVERY DAY 90 tablet 0   hydrALAZINE  (APRESOLINE ) 25 MG tablet Take 1 tablet (25 mg total) by mouth 3 (three) times daily. 270 tablet 2   hydrochlorothiazide  (MICROZIDE ) 12.5 MG capsule Take 1 capsule (12.5 mg total) by mouth daily. 90 capsule 2   KLOR-CON  M10 10 MEQ tablet Take 1 tablet (10 mEq total) by mouth daily. 90 tablet 1   labetalol  (NORMODYNE ) 100 MG tablet Take 1 tablet (100 mg total) by mouth 2 (two) times daily. 180 tablet 2   lipase/protease/amylase (CREON ) 36000 UNITS CPEP capsule Take 2 capsules by mouth three times daily with meals and 1 capsule twice daily with snacks.  Needs follow up appointment for future refills. Please call [phone] to schedule 300 capsule 0   loratadine  (CLARITIN ) 10 MG tablet Take 1 tablet (10 mg total) by mouth daily. 30 tablet 6   losartan  (COZAAR ) 50 MG tablet Take 1 tablet (50 mg total) by mouth daily. 90 tablet 2   mirtazapine  (REMERON ) 30 MG tablet Take 1 tablet (30 mg total) by mouth at bedtime. 30 tablet 2   Misc. Devices (WRIST BRACE) MISC Right cockup wrist brace for carpal tunnel syndrome. 1 each EACH   naproxen  (NAPROSYN ) 500 MG tablet Take 1 tablet (500 mg total) by mouth 2 (two) times daily. (Patient not taking: Reported on 09/16/2023) 90 tablet 1   Omega-3 Fatty Acids (FISH OIL) 1000 MG CAPS Take 1 capsule by mouth  daily.     omeprazole  (PRILOSEC) 40 MG capsule Omeprazole  40 mg by mouth twice a day for 4 weeks ,then back to 40 mg once a day if that controls reflux symptoms 90 capsule 3   ondansetron  (ZOFRAN -ODT) 4 MG disintegrating tablet Take 1 tablet (4 mg total) by mouth every 8 (eight) hours as needed for nausea or vomiting. 20 tablet 0   oxymetazoline  (AFRIN NASAL SPRAY) 0.05 % nasal spray Place 1 spray into both nostrils 2 (two) times daily. 15 mL 0   predniSONE  (DELTASONE ) 50 MG tablet Take 1 tablet by mouth 13 hours prior to CT. Take 1 tablet by mouth 7 hours prior to CT. Take 1 tablet by mouth 1 hour prior to CT. (Patient not taking: Reported on 09/16/2023) 3 tablet 0   sucralfate  (CARAFATE ) 1 g tablet Take 1 tablet (1 g total) by mouth 4 (four) times daily -  with meals and at bedtime. 120 tablet 0   traMADol  (ULTRAM ) 50 MG tablet Take 1 tablet (50 mg total) by mouth every 6 (six) hours as needed. 15 tablet 0   No current facility-administered medications for this visit.    ROS: See above  Objective:  Psychiatric Specialty Exam: There were no vitals taken for this visit.There is no height or weight on file to calculate BMI.  General Appearance: Casual and Well Groomed  Eye Contact:  Good  Speech:  Clear and Coherent and Normal Rate  Volume:  Normal  Mood:  numb  Affect:  Mostly euthymic and more bright this visit; frustrated when describing court proceedings and  police involvement  Thought Content: Denies AVH; IOR; paranoia   Suicidal Thoughts:  Denies SI  Homicidal Thoughts:  No  Thought Process:  Goal Directed and Linear; often overly inclusive  Orientation:  Full (Time, Place, and Person)    Memory:  Grossly intact  Judgment:  Good  Insight:  Fair  Concentration:  Concentration: Fair  Recall:  NA  Fund of Knowledge: Good  Language: Good  Psychomotor Activity:  Normal  Akathisia:  No  AIMS (if indicated): not done  Assets:  Communication Skills Desire for  Improvement Housing Leisure Time Resilience Social Support Talents/Skills Transportation  ADL's:  Intact  Cognition: WNL  Sleep:  Good   PE: General: sits comfortably in view of camera; no acute distress  Pulm: no increased work of breathing on room air  MSK: all extremity movements appear intact  Neuro: no focal neurological deficits observed  Gait & Station: unable to assess by video    Metabolic Disorder Labs: Lab Results  Component Value Date   HGBA1C 5.6 12/11/2021   No results found for: PROLACTIN Lab Results  Component Value Date   CHOL 128 12/05/2023   TRIG 63 12/05/2023   HDL 54 12/05/2023   CHOLHDL 2.4 12/05/2023   VLDL 11 09/14/2021   LDLCALC 61 12/05/2023   LDLCALC 160 (H) 07/28/2023   No results found for: TSH  Therapeutic Level Labs: No results found for: LITHIUM No results found for: VALPROATE No results found for: CBMZ  Screenings:  GAD-7    Flowsheet Row Office Visit from 12/11/2021 in [facility] Video Visit from 10/02/2021 in [facility] Office Visit from 06/27/2021 in [facility] Office Visit from 04/19/2021 in [facility] Office Visit from 01/11/2021 in [facility]  Total GAD-7 Score 10 9 8 6  0   PHQ2-9    Flowsheet Row Counselor from 08/28/2023 in [facility] Office Visit from 12/11/2021 in [facility] Video Visit from 10/02/2021 in [facility] Office Visit from 06/27/2021 in [facility] Office Visit from 04/19/2021 in [facility]  PHQ-2 Total Score 6 4 1 1 1   PHQ-9 Total Score 16 6 -- -- --   Flowsheet Row Counselor from 08/28/2023 in [facility] ED from 08/13/2023 in [facility] at [facility] ED from  08/07/2023 in [facility] Emergency Department at [facility]  C-SSRS RISK CATEGORY Moderate Risk No Risk No Risk    Collaboration of Care: Collaboration of Care: Medication Management AEB ongoing medication management, Psychiatrist AEB established with this provider, and Referral or follow-up with counselor/therapist AEB established with individual psychotherapy  Patient/Guardian was advised Release of Information must be obtained prior to any record release in order to collaborate their care with an outside provider. Patient/Guardian was advised if they have not already done so to contact the registration department to sign all necessary forms in order for us  to release information regarding their care.   Consent: Patient/Guardian gives verbal consent for treatment and assignment of benefits for services provided during this visit. Patient/Guardian expressed understanding and agreed to proceed.   Televisit via video: I connected with patient on 01/26/2024 at  1:00 PM EST by a video enabled telemedicine application and verified that I am speaking with the correct person using two identifiers.  Location: Patient: home address in [place] Provider:  remote office in [place]   I discussed the limitations of evaluation and management by telemedicine and the availability of in person appointments. The patient expressed understanding and agreed to proceed.  I discussed the assessment and treatment plan with the patient. The patient was provided an opportunity to ask questions and all were answered. The patient agreed with the plan and demonstrated an understanding of the instructions.   The patient was advised to call back or seek an in-person evaluation if the symptoms worsen or if the condition fails to improve as anticipated.  I provided 20 minutes dedicated to the care of this patient via video on the date of this encounter to include chart review, face-to-face time with the patient, medication  management/counseling.  Psychotherapy was utilized during today's session from 3:10-3:30PM. Therapeutic interventions included empathic listening, supportive therapy, cognitive therapy. Used supportive interviewing techniques to provide emotional validation. Worked on cognitive reframing techniques and strengths awareness.  Improvement was evidenced by patient's participation and identified commitment to therapy goals.   Christie Viscomi, MD 01/26/2024, 10:12 AM

## 2024-01-26 NOTE — Telephone Encounter (Signed)
 01/26/2023- 2:03pm ( 7 minutes- non bill) Therapist connected to pt for tele-therapy session. Reported to continue to reside in [place] and no current prospects of housing in [place] or [place]. Shares to have sought services in [place] in which she has started to receive OPT and medication management services.  Therapist educated on upcoming appointment for medication management on 01/29/2023 in which Pt reported They are not going to be able to give me my medicine so I went ahead and got a new one. Pt stated for appointment to be cancelled for upcoming medication management with [facility]. Pt thanked clinician for services.  Therapist reminded pt in the event she moves back to [facility] is able to receive services with [facility].

## 2024-01-26 NOTE — Telephone Encounter (Signed)
"  error  "

## 2024-01-29 ENCOUNTER — Encounter (HOSPITAL_COMMUNITY)

## 2024-01-29 ENCOUNTER — Telehealth (INDEPENDENT_AMBULATORY_CARE_PROVIDER_SITE_OTHER): Payer: Self-pay | Admitting: Primary Care

## 2024-01-29 NOTE — Telephone Encounter (Signed)
 01/23/2024 - telephone call  VM full unable leave a message returning call

## 2024-01-29 NOTE — Telephone Encounter (Signed)
 Mychart

## 2024-02-20 ENCOUNTER — Telehealth: Payer: Self-pay | Admitting: *Deleted

## 2024-02-20 ENCOUNTER — Ambulatory Visit: Payer: Self-pay

## 2024-02-20 ENCOUNTER — Telehealth: Admitting: Nurse Practitioner

## 2024-02-20 DIAGNOSIS — F410 Panic disorder [episodic paroxysmal anxiety] without agoraphobia: Secondary | ICD-10-CM

## 2024-02-20 DIAGNOSIS — J019 Acute sinusitis, unspecified: Secondary | ICD-10-CM

## 2024-02-20 DIAGNOSIS — B9689 Other specified bacterial agents as the cause of diseases classified elsewhere: Secondary | ICD-10-CM | POA: Diagnosis not present

## 2024-02-20 MED ORDER — AMOXICILLIN-POT CLAVULANATE 875-125 MG PO TABS: 1.0000 | ORAL_TABLET | Freq: Two times a day (BID) | ORAL | 0 refills | Status: AC

## 2024-02-20 MED ORDER — FLUTICASONE PROPIONATE 50 MCG/ACT NA SUSP: 2.0000 | Freq: Every day | NASAL | 0 refills | Status: AC

## 2024-02-20 NOTE — Progress Notes (Signed)
 Complex Care Management Note Care Guide Note  02/20/2024 Name: Erin Pollard MRN: [id number] DOB: [birth year]   Complex Care Management Outreach Attempts: An unsuccessful telephone outreach was attempted today to offer the patient information about available complex care management services.  Follow Up Plan:  Additional outreach attempts will be made to offer the patient complex care management information and services.   Encounter Outcome:  No Answer  Harlene Satterfield  [facility]  [facility] Guide  Direct Dial: [phone]  Fax [phone]

## 2024-02-20 NOTE — Patient Instructions (Addendum)
 " Erin Pollard, thank you for joining Comer LULLA Rouleau, NP for today's virtual visit.  While this provider is not your primary care provider (PCP), if your PCP is located in our provider database this encounter information will be shared with them immediately following your visit.   A [facility] MyChart account gives you access to today's visit and all your visits, tests, and labs performed at [facility]  click here if you don't have a  MyChart account or go to [email or web address]  Consent: (Patient) Erin Pollard provided verbal consent for this virtual visit at the beginning of the encounter.  Current Medications:  Current Outpatient Medications:    amoxicillin -clavulanate (AUGMENTIN ) 875-125 MG tablet, Take 1 tablet by mouth 2 (two) times daily for 7 days., Disp: 14 tablet, Rfl: 0   fluticasone  (FLONASE ) 50 MCG/ACT nasal spray, Place 2 sprays into both nostrils daily., Disp: 18.2 mL, Rfl: 0   atorvastatin  (LIPITOR) 40 MG tablet, Take 1 tablet (40 mg total) by mouth daily., Disp: 90 tablet, Rfl: 3   cholecalciferol (VITAMIN D3) 25 MCG (1000 UNIT) tablet, Take 1,000 Units by mouth daily., Disp: , Rfl:    clonazePAM  (KLONOPIN ) 0.5 MG tablet, Take 1 tablet (0.5 mg total) by mouth 3 (three) times daily., Disp: 90 tablet, Rfl: 2   dicyclomine  (BENTYL ) 10 MG capsule, Take 1 capsule (10 mg total) by mouth every 6 (six) hours as needed for spasms (cramps)., Disp: 70 capsule, Rfl: 1   Eluxadoline  (VIBERZI ) 100 MG TABS, Take 1 tablet (100 mg total) by mouth 2 (two) times daily before a meal., Disp: 60 tablet, Rfl: 3   Evolocumab  (REPATHA  SURECLICK) 140 MG/ML SOAJ, Inject 140 mg into the skin every 14 (fourteen) days., Disp: 6 mL, Rfl: 3   ezetimibe  (ZETIA ) 10 MG tablet, Take 1 tablet (10 mg total) by mouth daily., Disp: 90 tablet, Rfl: 3   famotidine  (PEPCID ) 20 MG tablet, Take 20 mg by mouth as needed., Disp: , Rfl:    ferrous sulfate  325 (65 FE) MG EC tablet, TAKE 1  TABLET BY MOUTH EVERY DAY, Disp: 90 tablet, Rfl: 0   hydrALAZINE  (APRESOLINE ) 25 MG tablet, Take 1 tablet (25 mg total) by mouth 3 (three) times daily., Disp: 270 tablet, Rfl: 2   hydrochlorothiazide  (MICROZIDE ) 12.5 MG capsule, Take 1 capsule (12.5 mg total) by mouth daily., Disp: 90 capsule, Rfl: 2   KLOR-CON  M10 10 MEQ tablet, Take 1 tablet (10 mEq total) by mouth daily., Disp: 90 tablet, Rfl: 1   labetalol  (NORMODYNE ) 100 MG tablet, Take 1 tablet (100 mg total) by mouth 2 (two) times daily., Disp: 180 tablet, Rfl: 2   lipase/protease/amylase (CREON ) 36000 UNITS CPEP capsule, Take 2 capsules by mouth three times daily with meals and 1 capsule twice daily with snacks.  Needs follow up appointment for future refills. Please call [phone] to schedule, Disp: 300 capsule, Rfl: 0   loratadine  (CLARITIN ) 10 MG tablet, Take 1 tablet (10 mg total) by mouth daily., Disp: 30 tablet, Rfl: 6   losartan  (COZAAR ) 50 MG tablet, Take 1 tablet (50 mg total) by mouth daily., Disp: 90 tablet, Rfl: 2   mirtazapine  (REMERON ) 30 MG tablet, Take 1 tablet (30 mg total) by mouth at bedtime., Disp: 30 tablet, Rfl: 2   Omega-3 Fatty Acids (FISH OIL) 1000 MG CAPS, Take 1 capsule by mouth daily., Disp: , Rfl:    omeprazole  (PRILOSEC) 40 MG capsule, Omeprazole  40 mg by mouth twice a day for 4 weeks ,  then back to 40 mg once a day if that controls reflux symptoms, Disp: 90 capsule, Rfl: 3   sucralfate  (CARAFATE ) 1 g tablet, Take 1 tablet (1 g total) by mouth 4 (four) times daily -  with meals and at bedtime., Disp: 120 tablet, Rfl: 0   Medications ordered in this encounter:  Meds ordered this encounter  Medications   amoxicillin -clavulanate (AUGMENTIN ) 875-125 MG tablet    Sig: Take 1 tablet by mouth 2 (two) times daily for 7 days.    Dispense:  14 tablet    Refill:  0   fluticasone  (FLONASE ) 50 MCG/ACT nasal spray    Sig: Place 2 sprays into both nostrils daily.    Dispense:  18.2 mL    Refill:  0     *If you need  refills on other medications prior to your next appointment, please contact your pharmacy*  Follow-Up: Call back or seek an in-person evaluation if the symptoms worsen or if the condition fails to improve as anticipated.   Other Instructions Based on what you have shared with me it looks like you have sinusitis.  Sinusitis is inflammation and infection in the sinus cavities of the head.  Based on your presentation I believe you most likely have Acute Bacterial Sinusitis.  This is an infection caused by bacteria and is treated with antibiotics. You have been prescribed an antibiotic augmentin  and flonase  nasal spray to help calm down the cough/manage the sinus symptoms. I also recommend taking Mucinex D, if you do not have glaucoma or high blood pressure, or Mucinex DM or similar over the counter. Saline nasal irrigations/rinses ([employer] Rinse or [employer]) 1-2 days are also beneficial to help treat the sinus symptoms; use bottled distilled water with these.  Please take your antibiotic in it's entirety, and do not stop it just because you are feeling better. Take it with food.  If you develop worsening sinus pain, fever or notice severe headache and vision changes, or if symptoms are not better after completion of antibiotic, please schedule an appointment with a health care provider.    Sinus infections are not as easily transmitted as other respiratory infection, however we still recommend that you avoid close contact with loved ones, especially the very young and elderly.  Remember to wash your hands thoroughly throughout the day as this is the number one way to prevent the spread of infection!  Home Care: Only take medications as instructed by your medical team. Complete the entire course of an antibiotic. Do not take these medications with alcohol. A steam or ultrasonic humidifier can help congestion.  You can place a towel over your head and breathe in the steam from hot water coming from a  faucet. Avoid close contacts especially the very young and the elderly. Cover your mouth when you cough or sneeze. Always remember to wash your hands.  Get Help Right Away If: You develop worsening fever or sinus pain. You develop a severe head ache or visual changes. Your symptoms persist after you have completed your treatment plan.      If you have been instructed to have an in-person evaluation today at a local Urgent Care facility, please use the link below. It will take you to a list of all of our available [facility], including address, phone number and hours of operation. Please do not delay care.  [facility]  If you or a family member do not have a primary care  provider, use the link below to schedule a visit and establish care. When you choose a [facility] primary care physician or advanced practice provider, you gain a long-term partner in health. Find a Primary Care Provider  Learn more about [facility] in-office and virtual care options: [facility] - Get Care Now  "

## 2024-02-20 NOTE — Telephone Encounter (Signed)
" °  FYI Only or Action Required?: FYI only for provider: appointment scheduled on 1/30.  Patient was last seen in primary care on 12/17/2023 by Celestia Rosaline SQUIBB, NP.  Called Nurse Triage reporting Sinusitis.  Symptoms began 2 weeks ago.  Interventions attempted: Rest, hydration, or home remedies.  Symptoms are: gradually worsening.  Triage Disposition: See PCP When Office is Open (Within 3 Days)  Patient/caregiver understands and will follow disposition?:   Reason for Triage: congested, miserable, ear clogged, mucus - green, face hurts  Reason for Disposition  [1] Sinus congestion (pressure, fullness) AND [2] present > 10 days  Answer Assessment - Initial Assessment Questions 1. LOCATION: Where does it hurt?      Face 2. ONSET: When did the sinus pain start?  (e.g., hours, days)      yesterday 3. SEVERITY: How bad is the pain?   (Scale 0-10; or none, mild, moderate or severe)     moderate 5. NASAL CONGESTION: Is the nose blocked? If Yes, ask: Can you open it or must you breathe through your mouth?     yes 6. NASAL DISCHARGE: Do you have discharge from your nose? If so ask, What color?     Yes, yellow 7. FEVER: Do you have a fever? If Yes, ask: What is it, how was it measured, and when did it start?      denies 8. OTHER SYMPTOMS: Do you have any other symptoms? (e.g., sore throat, cough, earache, difficulty breathing)     cough  Protocols used: Sinus Pain or Congestion-A-AH  "

## 2024-02-20 NOTE — Progress Notes (Signed)
 " Virtual Visit Consent   Erin Pollard, you are scheduled for a virtual visit with a [facility] provider today. Just as with appointments in the office, your consent must be obtained to participate. Your consent will be active for this visit and any virtual visit you may have with one of our providers in the next 365 days. If you have a MyChart account, a copy of this consent can be sent to you electronically.  As this is a virtual visit, video technology does not allow for your provider to perform a traditional examination. This may limit your provider's ability to fully assess your condition. If your provider identifies any concerns that need to be evaluated in person or the need to arrange testing (such as labs, EKG, etc.), we will make arrangements to do so. Although advances in technology are sophisticated, we cannot ensure that it will always work on either your end or our end. If the connection with a video visit is poor, the visit may have to be switched to a telephone visit. With either a video or telephone visit, we are not always able to ensure that we have a secure connection.  By engaging in this virtual visit, you consent to the provision of healthcare and authorize for your insurance to be billed (if applicable) for the services provided during this visit. Depending on your insurance coverage, you may receive a charge related to this service.  I need to obtain your verbal consent now. Are you willing to proceed with your visit today? Erin Pollard has provided verbal consent on 02/20/2024 for a virtual visit (video or telephone). Comer LULLA Rouleau, NP  Date: 02/20/2024 11:33 AM   Virtual Visit via Video Note   I, Comer LULLA Rouleau, connected with  Erin Pollard  ([id number], 1970/11/20) on 02/20/24 at 11:15 AM EST by a video-enabled telemedicine application and verified that I am speaking with the correct person using two identifiers.  Location: Patient: Virtual Visit Location  Patient: Home Provider: Virtual Visit Location Provider: Home Office   I discussed the limitations of evaluation and management by telemedicine and the availability of in person appointments. The patient expressed understanding and agreed to proceed.    History of Present Illness: Erin Pollard is a 54 y.o. who identifies as a female who was assigned female at birth, and is being seen today for sinus congestion.  Symptoms have been going on for two weeks. Feels she is getting worse despite trying to manage them at home with over the counter medications, rest and hydration.  She is very congested and having a lot of drainage from her nose, that is green in color.  Ears feel clogged but not painful Her face hurts around her sinuses and teeth  Occasional cough to clear phlegm but doesn't have any chest congestion  No headache, fevers, chills, sore throat, N/V  No drainage from the ears or hearing changes  No known sick exposures. 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  Resting comfortably at home.  Head is normocephalic, atraumatic.  No labored breathing. Congested voice  Speech is clear and coherent with logical content.  Patient is alert and oriented at baseline.    Assessment and Plan: 1. Acute bacterial rhinosinusitis (Primary) - amoxicillin -clavulanate (AUGMENTIN ) 875-125 MG tablet; Take 1  tablet by mouth 2 (two) times daily for 7 days.  Dispense: 14 tablet; Refill: 0 - fluticasone  (FLONASE ) 50 MCG/ACT nasal spray; Place 2 sprays into both nostrils daily.  Dispense: 18.2 mL; Refill: 0  Given failure to improve with supportive care measures, will treat for bacterial coinfection with empiric abx. Side effect profile reviewed. Reviewed supportive care measures and s/s necessitating in person evaluation.   Allergy listed to hydrocortisone iodoquinol; allergic to the iodine component, not the hydrocortisone per her report   Follow Up Instructions: I discussed the assessment and treatment plan with the patient. The patient was provided an opportunity to ask questions and all were answered. The patient agreed with the plan and demonstrated an understanding of the instructions.  A copy of instructions were sent to the patient via MyChart unless otherwise noted below.    The patient was advised to call back or seek an in-person evaluation if the symptoms worsen or if the condition fails to improve as anticipated.    Comer LULLA Rouleau, NP     [1]  Allergies Allergen Reactions   Amlodipine Hives    Achy legs and s.o.b Achy legs and s.o.b    Hydrocodone-Acetaminophen  Hives and Rash   Iodinated Contrast Media Hives   Iodine Hives and Rash   Codeine    Hydrocortisone-Iodoquinol    Propoxyphene   [2]  Current Outpatient Medications:    amoxicillin -clavulanate (AUGMENTIN ) 875-125 MG tablet, Take 1 tablet by mouth 2 (two) times daily for 7 days., Disp: 14 tablet, Rfl: 0   fluticasone  (FLONASE ) 50 MCG/ACT nasal spray, Place 2 sprays into both nostrils daily., Disp: 18.2 mL, Rfl: 0   atorvastatin  (LIPITOR) 40 MG tablet, Take 1 tablet (40 mg total) by mouth daily., Disp: 90 tablet, Rfl: 3   cholecalciferol (VITAMIN D3) 25 MCG (1000 UNIT) tablet, Take 1,000 Units by mouth daily., Disp: , Rfl:    clonazePAM  (KLONOPIN ) 0.5 MG tablet, Take 1 tablet (0.5 mg total) by mouth 3 (three) times  daily., Disp: 90 tablet, Rfl: 2   dicyclomine  (BENTYL ) 10 MG capsule, Take 1 capsule (10 mg total) by mouth every 6 (six) hours as needed for spasms (cramps)., Disp: 70 capsule, Rfl: 1   Eluxadoline  (VIBERZI ) 100 MG TABS, Take 1 tablet (100 mg total) by mouth 2 (two) times daily before a meal., Disp: 60 tablet, Rfl: 3   Evolocumab  (REPATHA  SURECLICK) 140 MG/ML SOAJ, Inject 140 mg into the skin every 14 (fourteen) days., Disp: 6 mL, Rfl: 3   ezetimibe  (ZETIA ) 10 MG tablet, Take 1 tablet (10 mg total) by mouth daily., Disp: 90 tablet, Rfl: 3   famotidine  (PEPCID ) 20 MG tablet, Take 20 mg by mouth as needed., Disp: , Rfl:    ferrous sulfate  325 (65 FE) MG EC tablet, TAKE 1 TABLET BY MOUTH EVERY DAY, Disp: 90 tablet, Rfl: 0   hydrALAZINE  (APRESOLINE ) 25 MG tablet, Take 1 tablet (25 mg total) by mouth 3 (three) times daily., Disp: 270 tablet, Rfl: 2   hydrochlorothiazide  (MICROZIDE ) 12.5 MG capsule, Take 1 capsule (12.5 mg total) by mouth daily., Disp: 90 capsule, Rfl: 2   KLOR-CON  M10  10 MEQ tablet, Take 1 tablet (10 mEq total) by mouth daily., Disp: 90 tablet, Rfl: 1   labetalol  (NORMODYNE ) 100 MG tablet, Take 1 tablet (100 mg total) by mouth 2 (two) times daily., Disp: 180 tablet, Rfl: 2   lipase/protease/amylase (CREON ) 36000 UNITS CPEP capsule, Take 2 capsules by mouth three times daily with meals and 1 capsule twice daily with snacks.  Needs follow up appointment for future refills. Please call [phone] to schedule, Disp: 300 capsule, Rfl: 0   loratadine  (CLARITIN ) 10 MG tablet, Take 1 tablet (10 mg total) by mouth daily., Disp: 30 tablet, Rfl: 6   losartan  (COZAAR ) 50 MG tablet, Take 1 tablet (50 mg total) by mouth daily., Disp: 90 tablet, Rfl: 2   mirtazapine  (REMERON ) 30 MG tablet, Take 1 tablet (30 mg total) by mouth at bedtime., Disp: 30 tablet, Rfl: 2   Omega-3 Fatty Acids (FISH OIL) 1000 MG CAPS, Take 1 capsule by mouth daily., Disp: , Rfl:    omeprazole  (PRILOSEC) 40 MG capsule,  Omeprazole  40 mg by mouth twice a day for 4 weeks ,then back to 40 mg once a day if that controls reflux symptoms, Disp: 90 capsule, Rfl: 3   sucralfate  (CARAFATE ) 1 g tablet, Take 1 tablet (1 g total) by mouth 4 (four) times daily -  with meals and at bedtime., Disp: 120 tablet, Rfl: 0  "

## 2024-02-20 NOTE — Telephone Encounter (Signed)
 Noted.

## 2024-02-27 ENCOUNTER — Telehealth: Payer: Self-pay

## 2024-02-28 IMAGING — CR DG CHEST 2V
2 series · 2 of 2 positions shown · non-contrast
Comparison: 11/13/2020

CLINICAL DATA: Chest pain

EXAM:
CHEST - 2 VIEW

[w chest pa]
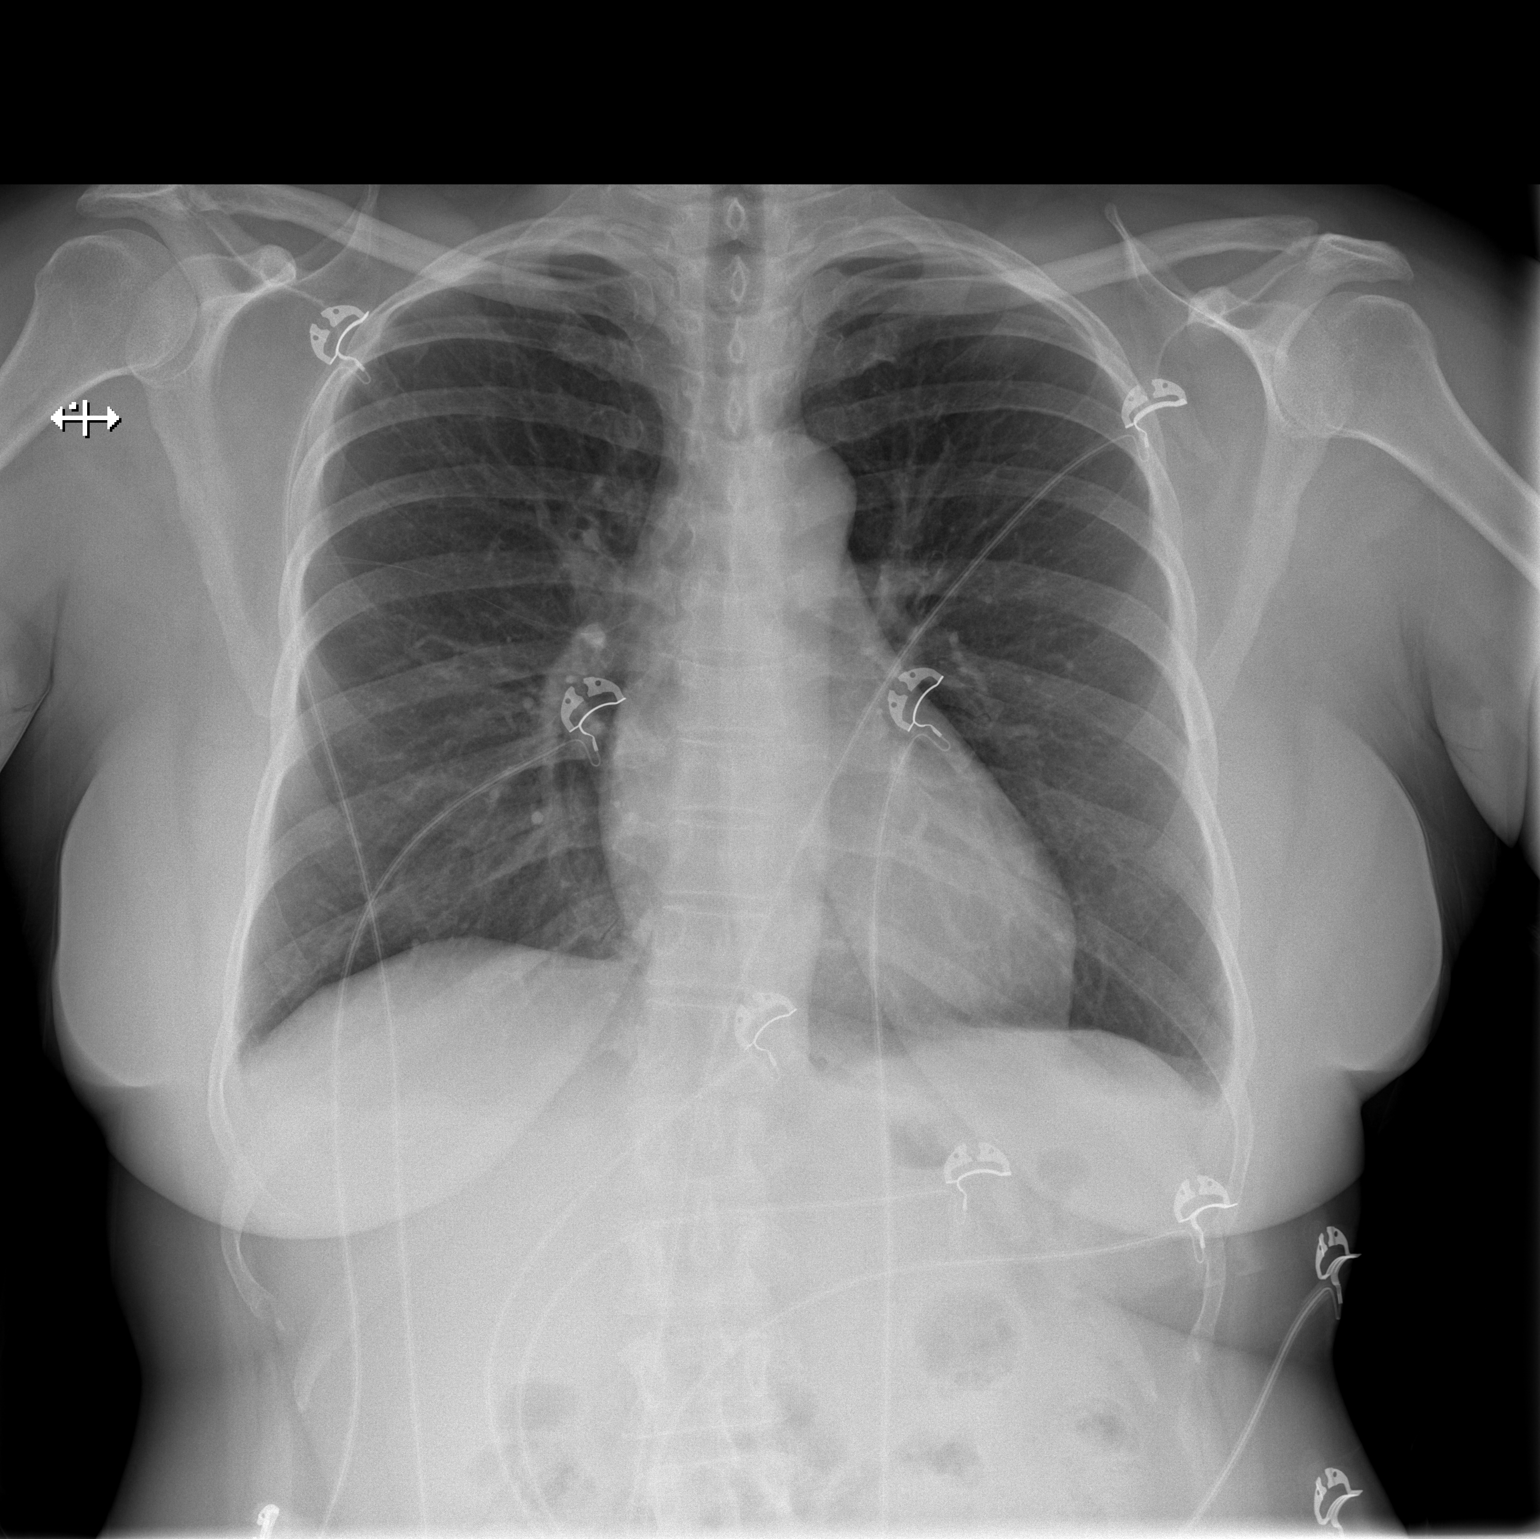

[w chest lat]
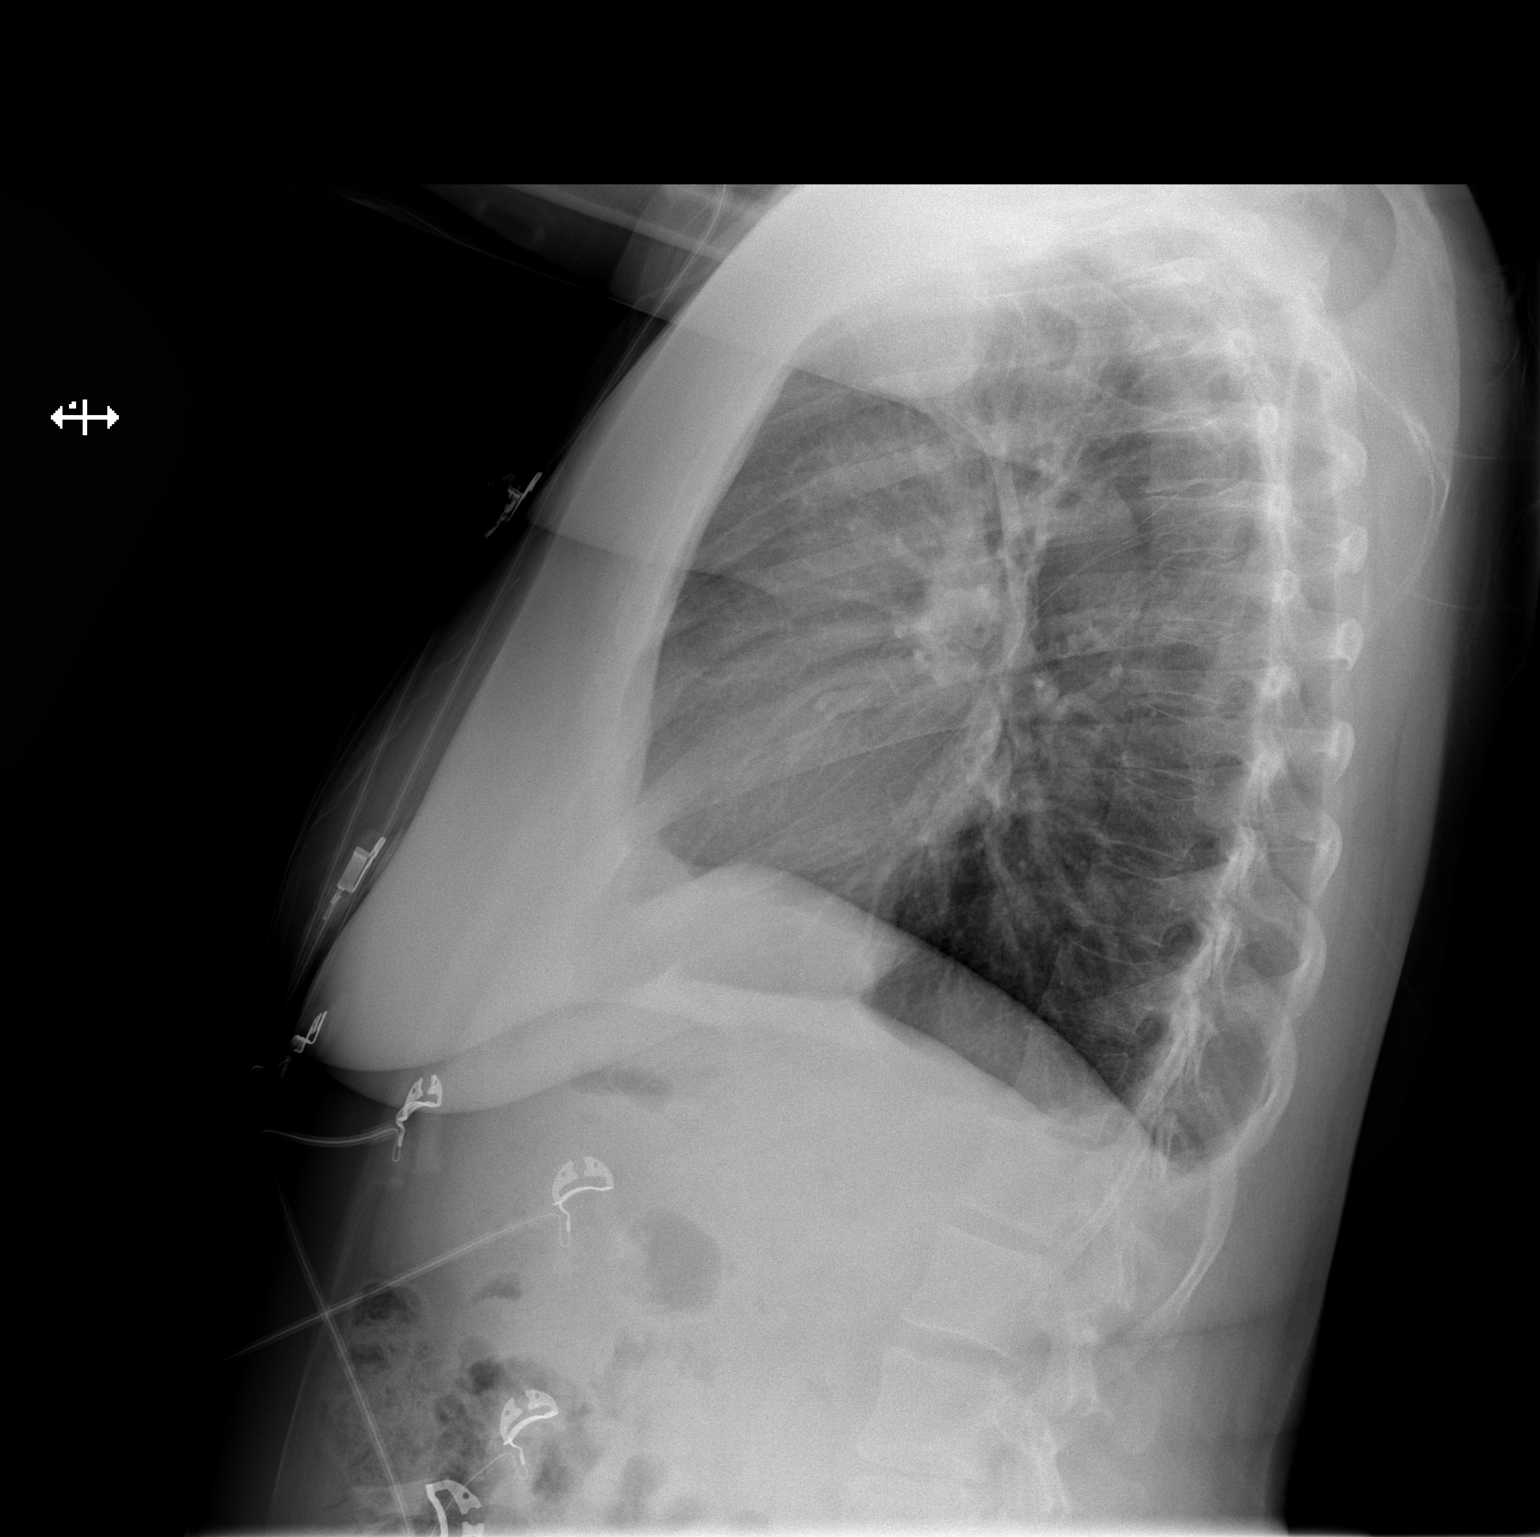

[2 of 2 positions shown; findings below may reference images not displayed]

FINDINGS: The heart size and mediastinal contours are within normal limits.
Both lungs are clear. The visualized skeletal structures are
unremarkable.
IMPRESSION: Normal study.

## 2024-04-18 IMAGING — CR DG CHEST 2V
2 series · 2 of 2 positions shown · non-contrast
Comparison: Chest radiograph dated 04/20/2021.

CLINICAL DATA: Chest pain and hypertension.

EXAM:
CHEST - 2 VIEW

[w chest pa]
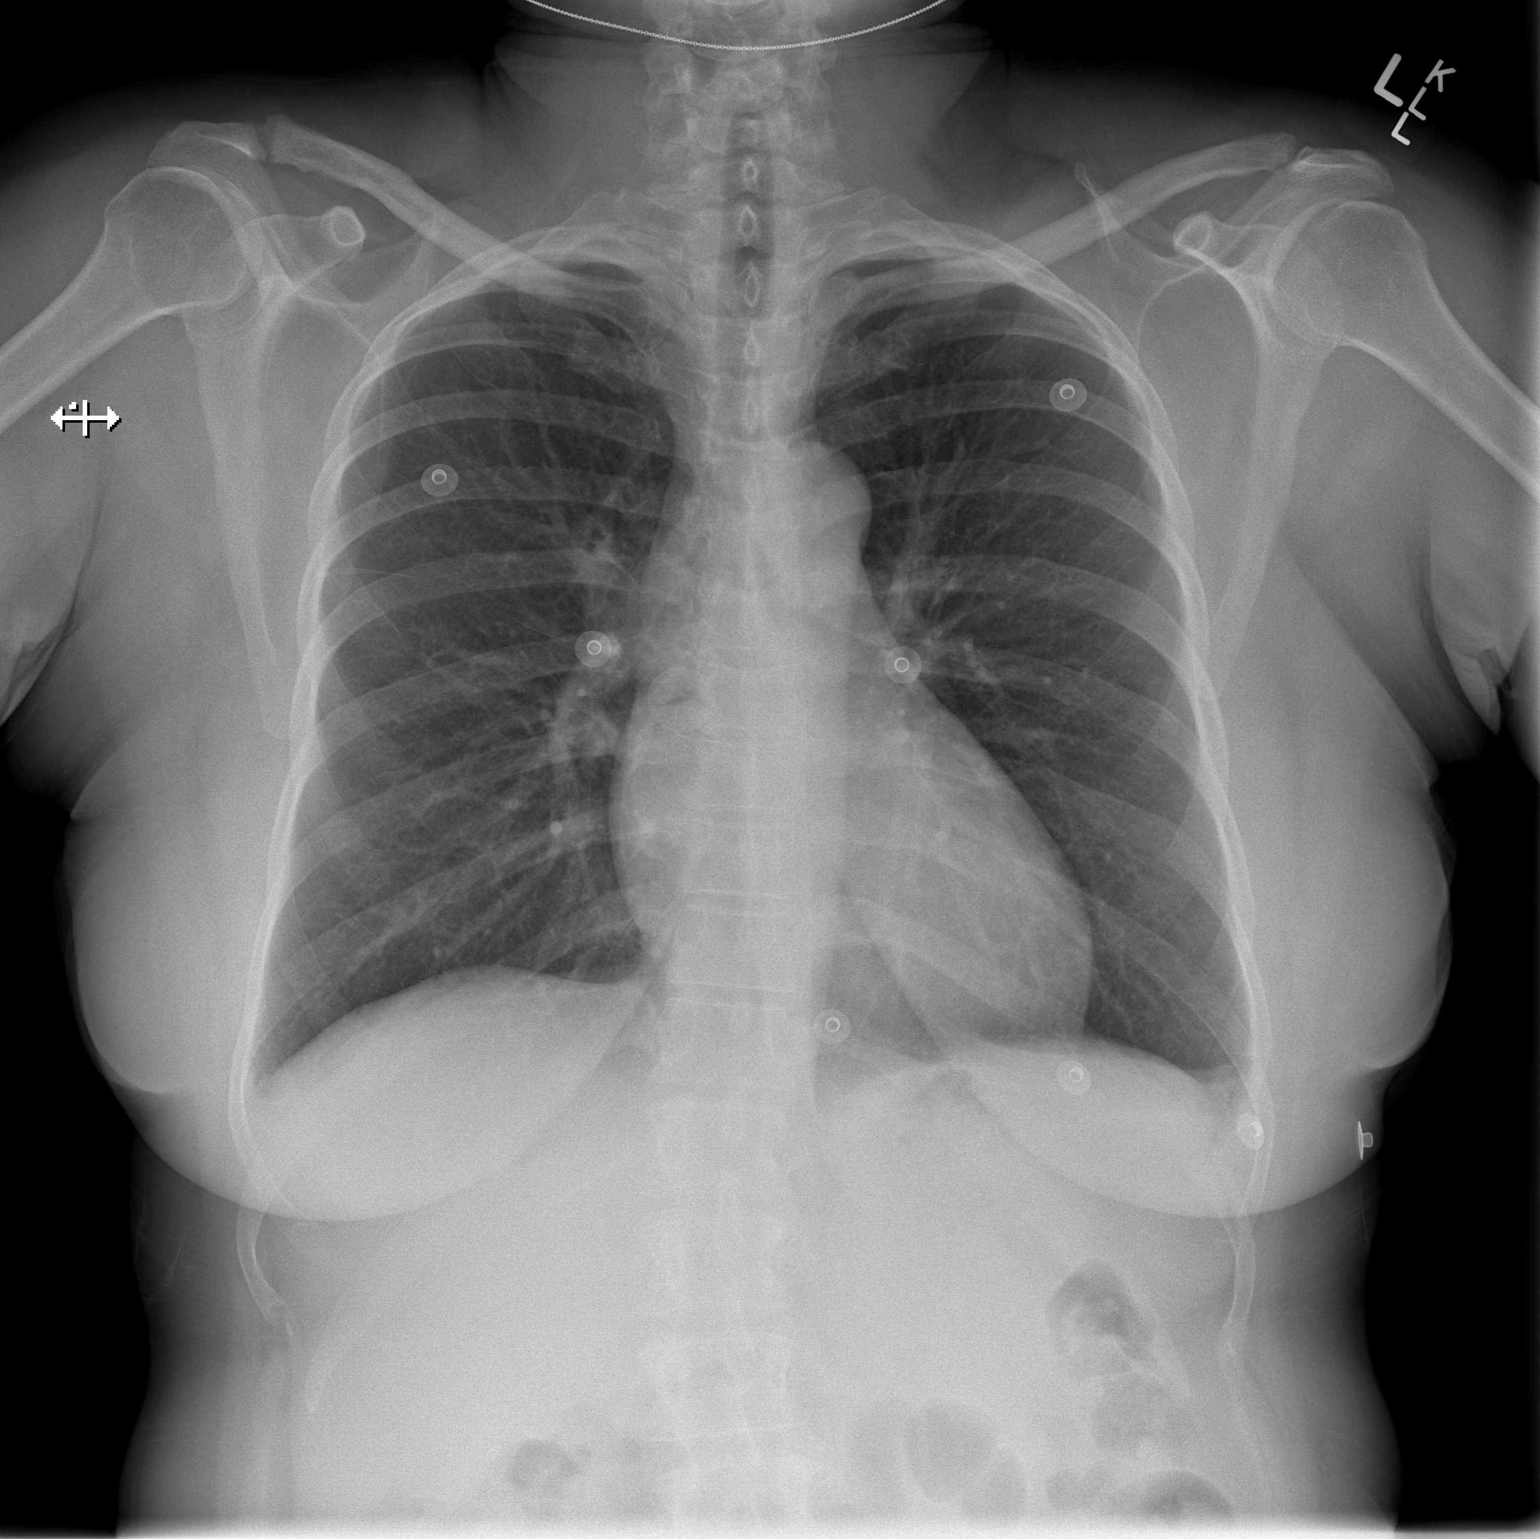

[w chest lat]
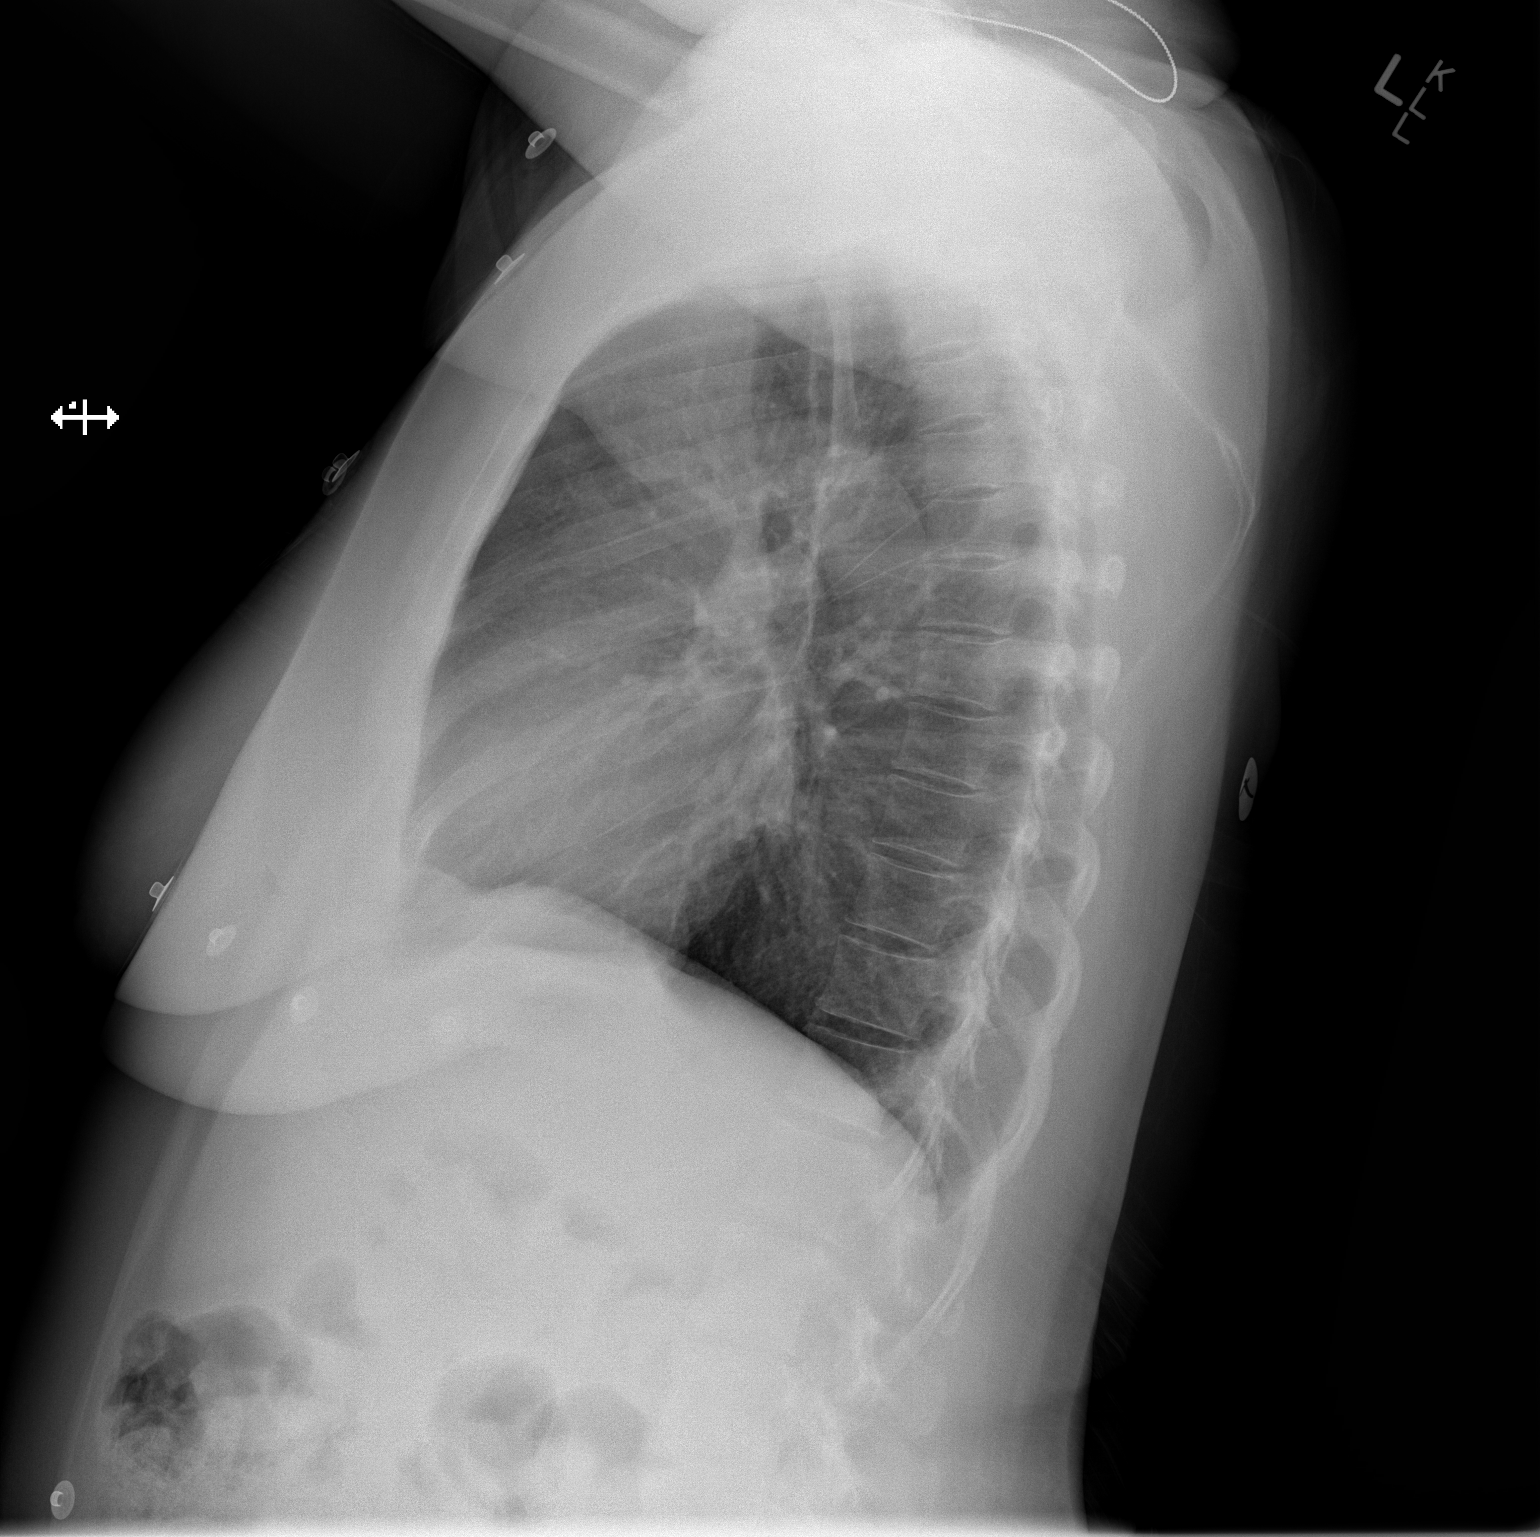

[2 of 2 positions shown; findings below may reference images not displayed]

FINDINGS: The heart size and mediastinal contours are within normal limits.
Both lungs are clear. The visualized skeletal structures are
unremarkable.
IMPRESSION: No active cardiopulmonary disease.
